# Patient Record
Sex: Female | Born: 1960
Health system: Southern US, Community
[De-identification: ages and names within clinical notes are randomized; demographics above are authoritative.]

## PROBLEM LIST (undated history)

## (undated) DIAGNOSIS — F32A Depression, unspecified: Secondary | ICD-10-CM

## (undated) DIAGNOSIS — F329 Major depressive disorder, single episode, unspecified: Secondary | ICD-10-CM

## (undated) DIAGNOSIS — R600 Localized edema: Secondary | ICD-10-CM

## (undated) DIAGNOSIS — G8929 Other chronic pain: Secondary | ICD-10-CM

## (undated) DIAGNOSIS — C439 Malignant melanoma of skin, unspecified: Secondary | ICD-10-CM

## (undated) DIAGNOSIS — M1990 Unspecified osteoarthritis, unspecified site: Secondary | ICD-10-CM

## (undated) DIAGNOSIS — D649 Anemia, unspecified: Secondary | ICD-10-CM

## (undated) DIAGNOSIS — R32 Unspecified urinary incontinence: Secondary | ICD-10-CM

## (undated) DIAGNOSIS — K5792 Diverticulitis of intestine, part unspecified, without perforation or abscess without bleeding: Secondary | ICD-10-CM

## (undated) DIAGNOSIS — M549 Dorsalgia, unspecified: Secondary | ICD-10-CM

## (undated) DIAGNOSIS — Z720 Tobacco use: Secondary | ICD-10-CM

## (undated) HISTORY — DX: Unspecified urinary incontinence: R32

## (undated) HISTORY — PX: BACK SURGERY: SHX140

## (undated) HISTORY — DX: Major depressive disorder, single episode, unspecified: F32.9

## (undated) HISTORY — DX: Depression, unspecified: F32.A

## (undated) HISTORY — PX: TONSILLECTOMY: SUR1361

## (undated) HISTORY — DX: Unspecified osteoarthritis, unspecified site: M19.90

## (undated) HISTORY — DX: Localized edema: R60.0

## (undated) HISTORY — DX: Anemia, unspecified: D64.9

## (undated) HISTORY — DX: Malignant melanoma of skin, unspecified: C43.9

## (undated) HISTORY — PX: MELANOMA EXCISION: SHX5266

## (undated) HISTORY — DX: Tobacco use: Z72.0

---

## 1987-09-21 HISTORY — PX: ABDOMINAL HYSTERECTOMY: SHX81

## 2015-05-20 ENCOUNTER — Encounter: Payer: Self-pay | Admitting: Medical

## 2015-05-20 ENCOUNTER — Telehealth: Payer: Self-pay | Admitting: Medical

## 2015-05-20 ENCOUNTER — Ambulatory Visit (INDEPENDENT_AMBULATORY_CARE_PROVIDER_SITE_OTHER): Payer: BLUE CROSS/BLUE SHIELD | Admitting: Medical

## 2015-05-20 VITALS — BP 145/79 | HR 108 | Temp 98.2°F | Ht 61.2 in | Wt 141.2 lb

## 2015-05-20 DIAGNOSIS — C439 Malignant melanoma of skin, unspecified: Secondary | ICD-10-CM | POA: Diagnosis not present

## 2015-05-20 DIAGNOSIS — M1990 Unspecified osteoarthritis, unspecified site: Secondary | ICD-10-CM | POA: Diagnosis not present

## 2015-05-20 DIAGNOSIS — F329 Major depressive disorder, single episode, unspecified: Secondary | ICD-10-CM

## 2015-05-20 DIAGNOSIS — D72829 Elevated white blood cell count, unspecified: Secondary | ICD-10-CM

## 2015-05-20 DIAGNOSIS — R197 Diarrhea, unspecified: Secondary | ICD-10-CM | POA: Diagnosis not present

## 2015-05-20 DIAGNOSIS — F32A Depression, unspecified: Secondary | ICD-10-CM | POA: Insufficient documentation

## 2015-05-20 DIAGNOSIS — E876 Hypokalemia: Secondary | ICD-10-CM

## 2015-05-20 LAB — CBC WITH DIFFERENTIAL/PLATELET
BASOS ABS: 0 10*3/uL (ref 0.0–0.1)
Basophils Relative: 0.3 % (ref 0.0–3.0)
EOS ABS: 0 10*3/uL (ref 0.0–0.7)
EOS PCT: 0.3 % (ref 0.0–5.0)
HCT: 37.4 % (ref 36.0–46.0)
Hemoglobin: 12.4 g/dL (ref 12.0–15.0)
LYMPHS ABS: 1.7 10*3/uL (ref 0.7–4.0)
LYMPHS PCT: 13 % (ref 12.0–46.0)
MCHC: 33 g/dL (ref 30.0–36.0)
MCV: 94.9 fl (ref 78.0–100.0)
MONO ABS: 1.2 10*3/uL — AB (ref 0.1–1.0)
MONOS PCT: 8.9 % (ref 3.0–12.0)
NEUTROS ABS: 10.2 10*3/uL — AB (ref 1.4–7.7)
NEUTROS PCT: 77.5 % — AB (ref 43.0–77.0)
PLATELETS: 728 10*3/uL — AB (ref 150.0–400.0)
RBC: 3.94 Mil/uL (ref 3.87–5.11)
RDW: 13.4 % (ref 11.5–15.5)
WBC: 13.1 10*3/uL — AB (ref 4.0–10.5)

## 2015-05-20 LAB — COMPREHENSIVE METABOLIC PANEL
ALT: 8 U/L (ref 0–35)
AST: 12 U/L (ref 0–37)
Albumin: 3.5 g/dL (ref 3.5–5.2)
Alkaline Phosphatase: 82 U/L (ref 39–117)
BILIRUBIN TOTAL: 0.4 mg/dL (ref 0.2–1.2)
BUN: 8 mg/dL (ref 6–23)
CO2: 30 meq/L (ref 19–32)
Calcium: 9.5 mg/dL (ref 8.4–10.5)
Chloride: 90 mEq/L — ABNORMAL LOW (ref 96–112)
Creatinine, Ser: 0.55 mg/dL (ref 0.40–1.20)
GFR: 121.42 mL/min (ref >60.00)
GLUCOSE: 97 mg/dL (ref 70–99)
Potassium: 3.2 mEq/L — ABNORMAL LOW (ref 3.5–5.1)
SODIUM: 129 meq/L — AB (ref 135–145)
TOTAL PROTEIN: 7.2 g/dL (ref 6.0–8.3)

## 2015-05-20 MED ORDER — CIPROFLOXACIN HCL 500 MG PO TABS: 500.0000 mg | ORAL_TABLET | Freq: Two times a day (BID) | ORAL | Status: DC

## 2015-05-20 MED ORDER — POTASSIUM CHLORIDE CRYS ER 10 MEQ PO TBCR: 10.0000 meq | EXTENDED_RELEASE_TABLET | Freq: Every day | ORAL | Status: DC

## 2015-05-20 MED ORDER — DIPHENOXYLATE-ATROPINE 2.5-0.025 MG PO TABS: ORAL_TABLET | ORAL | Status: DC

## 2015-05-20 MED ORDER — ONDANSETRON 8 MG PO TBDP: 8.0000 mg | ORAL_TABLET | Freq: Three times a day (TID) | ORAL | Status: DC | PRN

## 2015-05-20 NOTE — Assessment & Plan Note (Signed)
Hx of melanoma-  Excised from upper chest region. Pt sees dermatologist regularly.

## 2015-05-20 NOTE — Assessment & Plan Note (Signed)
Depression- This occurred when husband passed away. Now feels fine

## 2015-05-20 NOTE — Progress Notes (Signed)
Pre visit review using our clinic review tool, if applicable. No additional management support is needed unless otherwise documented below in the visit note. 

## 2015-05-20 NOTE — Progress Notes (Signed)
Subjective:    Patient ID: [name], female    DOB: [birth year], 54 y.o.   MRN: [id number]  HPI   I have reviewed pt PMH, PSH, FH, Social History and Surgical History  Pt is Government social research officer for [employer]. Pt does walks every day for 45 minutes at work on treadmill,  No caffeine since August 12th, Not healthy diet since 12th.    Pt states since August 12 she has had some diarrhea. Some intermittent nauseau and vomiting. Pt states symptoms worse until August 18 th. Then felt good for couple of days then reoccurred.   Pt in today reporting  diarrhea for  Almost 21 days. On review no  association with with any foods or meal that immediately preceded onset of diarrhea. Report contact with no  persons with GI illness. Recent no antibiotics. Reports no history of an inflammatory bowel diseases. Reports approximately number of stools about 8-12 loose stools  a day(past 2-3 days). Pt vomiting about 1-2 times a day. Stomach cramps. Pt has tried otc treatments. Before diarrhea came on she was not constipated.   Early on 3-4 times a day loose stools but has gradually gotten worse. Last 2-3 days 8-12 times a day. Pt states no one close to her has had Gi type illness. Pt has not visited anyone recently in hospital.  Arthritis history- knees, hand lt hip and lower back pain. Pt states she may need hip replacement. Pt is on cymbalta and tramadol for pain.  Hx of melanoma-  Excised from upper chest region. Pt sees dermatologist regularly.  Depression- This occurred when husband passed away. Now feels fine.   Pt is a smoker.     Review of Systems  Constitutional: Positive for chills and fatigue. Negative for fever and diaphoresis.  Respiratory: Negative for cough, chest tightness, shortness of breath and wheezing.   Cardiovascular: Negative for chest pain and palpitations.  Gastrointestinal: Positive for nausea, vomiting, abdominal pain and diarrhea. Negative for blood in stool, abdominal  distention and rectal pain.       Last time vomited this am.  Genitourinary:       Hx of urinary incontinence.  Musculoskeletal: Negative for back pain.  Neurological: Negative for dizziness and headaches.  Hematological: Negative for adenopathy. Does not bruise/bleed easily.  Psychiatric/Behavioral: Negative for behavioral problems and confusion.    Past Medical History  Diagnosis Date  . Arthritis   . Cancer   . Depression     Social History   Social History  . Marital Status: Widowed    Spouse Name: N/A  . Number of Children: N/A  . Years of Education: N/A   Occupational History  . Not on file.   Social History Main Topics  . Smoking status: Current Every Day Smoker  . Smokeless tobacco: Never Used  . Alcohol Use: 0.0 oz/week    0 Standard drinks or equivalent per week  . Drug Use: Not on file  . Sexual Activity: Not on file   Other Topics Concern  . Not on file   Social History Narrative  . No narrative on file    Past Surgical History  Procedure Laterality Date  . Abdominal hysterectomy    . Tonsillectomy    . Back surgery    . Melanoma excision      Family History  Problem Relation Age of Onset  . Hypertension Father     No Known Allergies  No current outpatient prescriptions on file prior to visit.  No current facility-administered medications on file prior to visit.    BP 145/79 mmHg  Pulse 110  Temp(Src) 98.2 F (36.8 C) (Oral)  Ht 5' 1.2" (1.554 m)  Wt 141 lb 3.2 oz (64.048 kg)  BMI 26.52 kg/m2  SpO2 97%       Objective:   Physical Exam  General Appearance- Not in acute distress.  HEENT Eyes- Scleraeral/Conjuntiva-bilat- Not Yellow. Mouth & Throat- Normal.  Chest and Lung Exam Auscultation: Breath sounds:-Normal. Adventitious sounds:- No Adventitious sounds.  Cardiovascular Auscultation:Rythm - Regular. Heart Sounds -Normal heart sounds.  Abdomen Inspection:-Inspection Normal.  Palpation/Perucssion: Palpation and  Percussion of the abdomen reveal- Non Tender, No Rebound tenderness, No rigidity(Guarding) and No Palpable abdominal masses.  Liver:-Normal.  Spleen:- Normal.   Skin- moist skin. No tenting.  Back- no cva tenderness      Assessment & Plan:    Arthritis Arthritis history- knees, hand lt hip and lower back pain. Pt states she may need hip replacement. Pt is on cymbalta and tramadol for pain.  Melanoma of skin Hx of melanoma-  Excised from upper chest region. Pt sees dermatologist regularly.  Depression Depression- This occurred when husband passed away. Now feels fine    You may have bacterial cause of diarrhea based on severity and duration. I want you to rest, hydrate, follow bland diet guidlines and take tylenol for fever.  Lomotil  for diarrhea. For nausea of vomiting, I am prescribing zofran.   There is some chance that your have a bacterial infection so I do want you to get stool panel kit and turn that in as soon as possible. Turning stool panel kit earlier will provide [place] with quicker result of studies and more informed decision if antibiotics are needed.  Start cipro after stool panel kit turned in.

## 2015-05-20 NOTE — Patient Instructions (Addendum)
Arthritis Arthritis history- knees, hand lt hip and lower back pain. Pt states she may need hip replacement. Pt is on cymbalta and tramadol for pain.  Melanoma of skin Hx of melanoma-  Excised from upper chest region. Pt sees dermatologist regularly.  Depression Depression- This occurred when husband passed away. Now feels fine     You may have bacterial cause of diarrhea based on severity and duration. I want you to rest, hydrate, follow bland diet guidlines and take tylenol for fever.  Lomotil  for diarrhea. For nausea of vomiting, I am prescribing zofran.   There is some chance that your have a bacterial infection so I do want you to get stool panel kit and turn that in as soon as possible. Turning stool panel kit earlier will provide [place] with quicker result of studies and more informed decision if antibiotics are needed.  Start cipro after stool panel kit turned in.  Follow up 5 days or as needed.

## 2015-05-20 NOTE — Telephone Encounter (Signed)
Labs placed and k-dur ordered

## 2015-05-20 NOTE — Assessment & Plan Note (Signed)
Arthritis history- knees, hand lt hip and lower back pain. Pt states she may need hip replacement. Pt is on cymbalta and tramadol for pain.

## 2015-05-23 ENCOUNTER — Other Ambulatory Visit: Payer: BLUE CROSS/BLUE SHIELD

## 2015-05-23 ENCOUNTER — Other Ambulatory Visit (INDEPENDENT_AMBULATORY_CARE_PROVIDER_SITE_OTHER): Payer: BLUE CROSS/BLUE SHIELD

## 2015-05-23 ENCOUNTER — Telehealth: Payer: Self-pay | Admitting: Medical

## 2015-05-23 DIAGNOSIS — D72829 Elevated white blood cell count, unspecified: Secondary | ICD-10-CM

## 2015-05-23 DIAGNOSIS — E876 Hypokalemia: Secondary | ICD-10-CM | POA: Diagnosis not present

## 2015-05-23 LAB — CBC WITH DIFFERENTIAL/PLATELET
BASOS ABS: 0 10*3/uL (ref 0.0–0.1)
Basophils Relative: 0.2 % (ref 0.0–3.0)
EOS ABS: 0 10*3/uL (ref 0.0–0.7)
Eosinophils Relative: 0.3 % (ref 0.0–5.0)
HEMATOCRIT: 36.9 % (ref 36.0–46.0)
Hemoglobin: 12.3 g/dL (ref 12.0–15.0)
LYMPHS PCT: 9.7 % — AB (ref 12.0–46.0)
Lymphs Abs: 1.6 10*3/uL (ref 0.7–4.0)
MCHC: 33.3 g/dL (ref 30.0–36.0)
MCV: 94.6 fl (ref 78.0–100.0)
Monocytes Absolute: 1.6 10*3/uL — ABNORMAL HIGH (ref 0.1–1.0)
Monocytes Relative: 10.2 % (ref 3.0–12.0)
NEUTROS ABS: 12.7 10*3/uL — AB (ref 1.4–7.7)
Neutrophils Relative %: 79.6 % — ABNORMAL HIGH (ref 43.0–77.0)
PLATELETS: 745 10*3/uL — AB (ref 150.0–400.0)
RBC: 3.9 Mil/uL (ref 3.87–5.11)
RDW: 13.4 % (ref 11.5–15.5)
WBC: 15.9 10*3/uL — AB (ref 4.0–10.5)

## 2015-05-23 LAB — COMPREHENSIVE METABOLIC PANEL
ALT: 10 U/L (ref 0–35)
AST: 11 U/L (ref 0–37)
Albumin: 3.3 g/dL — ABNORMAL LOW (ref 3.5–5.2)
Alkaline Phosphatase: 81 U/L (ref 39–117)
BILIRUBIN TOTAL: 0.5 mg/dL (ref 0.2–1.2)
BUN: 4 mg/dL — ABNORMAL LOW (ref 6–23)
CALCIUM: 9.7 mg/dL (ref 8.4–10.5)
CO2: 31 meq/L (ref 19–32)
CREATININE: 0.55 mg/dL (ref 0.40–1.20)
Chloride: 91 mEq/L — ABNORMAL LOW (ref 96–112)
GFR: 121.42 mL/min (ref >60.00)
GLUCOSE: 108 mg/dL — AB (ref 70–99)
Potassium: 3.3 mEq/L — ABNORMAL LOW (ref 3.5–5.1)
Sodium: 131 mEq/L — ABNORMAL LOW (ref 135–145)
TOTAL PROTEIN: 7.1 g/dL (ref 6.0–8.3)

## 2015-05-23 LAB — OVA AND PARASITE EXAMINATION: OP: NONE SEEN

## 2015-05-23 LAB — CLOSTRIDIUM DIFFICILE BY PCR: CDIFFPCR: NOT DETECTED

## 2015-05-23 MED ORDER — CIPROFLOXACIN HCL 500 MG PO TABS: 500.0000 mg | ORAL_TABLET | Freq: Two times a day (BID) | ORAL | Status: DC

## 2015-05-23 MED ORDER — METRONIDAZOLE 500 MG PO TABS: 500.0000 mg | ORAL_TABLET | Freq: Three times a day (TID) | ORAL | Status: DC

## 2015-05-23 NOTE — Telephone Encounter (Signed)
I called pt.  Her diarrhea is about the same. She did not start lomotil yet. Will start today. She started cipro yesterday. Her white count more elevated than the other day. Since culture is pending I decided would rx flagyl in addition to the cipro. In light of upcoming long weekend. Also get k-dur tabs I rx'd. Hydrate well. Follow up on wed with me. Can get cbc on Tuesday if diarrhea is continuing. If some better just follow up with me on Wednesday.  If symptoms worsening as described then explained ED evaluation over the weekend.

## 2015-05-26 LAB — STOOL CULTURE

## 2015-05-28 ENCOUNTER — Encounter: Payer: Self-pay | Admitting: Medical

## 2015-05-28 ENCOUNTER — Other Ambulatory Visit: Payer: BLUE CROSS/BLUE SHIELD

## 2015-05-28 ENCOUNTER — Telehealth: Payer: Self-pay | Admitting: Medical

## 2015-05-28 ENCOUNTER — Encounter: Payer: Self-pay | Admitting: Physician Assistant

## 2015-05-28 ENCOUNTER — Ambulatory Visit (HOSPITAL_BASED_OUTPATIENT_CLINIC_OR_DEPARTMENT_OTHER)
Admission: RE | Admit: 2015-05-28 | Discharge: 2015-05-28 | Disposition: A | Discharge status: Home / Self Care | Payer: BLUE CROSS/BLUE SHIELD | Source: Ambulatory Visit | Attending: Medical | Admitting: Medical

## 2015-05-28 ENCOUNTER — Ambulatory Visit (INDEPENDENT_AMBULATORY_CARE_PROVIDER_SITE_OTHER): Payer: BLUE CROSS/BLUE SHIELD | Admitting: Medical

## 2015-05-28 VITALS — BP 120/76 | HR 100 | Temp 97.3°F | Resp 16 | Ht 61.2 in | Wt 136.0 lb

## 2015-05-28 DIAGNOSIS — R140 Abdominal distension (gaseous): Secondary | ICD-10-CM

## 2015-05-28 DIAGNOSIS — D611 Drug-induced aplastic anemia: Secondary | ICD-10-CM

## 2015-05-28 DIAGNOSIS — R197 Diarrhea, unspecified: Secondary | ICD-10-CM

## 2015-05-28 DIAGNOSIS — R1084 Generalized abdominal pain: Secondary | ICD-10-CM

## 2015-05-28 LAB — CBC WITH DIFFERENTIAL/PLATELET
BASOS ABS: 0 10*3/uL (ref 0.0–0.1)
Basophils Relative: 0 % (ref 0.0–3.0)
Eosinophils Absolute: 0 10*3/uL (ref 0.0–0.7)
Eosinophils Relative: 0.2 % (ref 0.0–5.0)
HCT: 38 % (ref 36.0–46.0)
Hemoglobin: 12.5 g/dL (ref 12.0–15.0)
LYMPHS ABS: 1.3 10*3/uL (ref 0.7–4.0)
Lymphocytes Relative: 7 % — ABNORMAL LOW (ref 12.0–46.0)
MCHC: 32.8 g/dL (ref 30.0–36.0)
MCV: 95 fl (ref 78.0–100.0)
MONO ABS: 1.5 10*3/uL — AB (ref 0.1–1.0)
MONOS PCT: 8.1 % (ref 3.0–12.0)
Neutro Abs: 15.5 10*3/uL — ABNORMAL HIGH (ref 1.4–7.7)
Platelets: 954 10*3/uL — ABNORMAL HIGH (ref 150.0–400.0)
RBC: 4 Mil/uL (ref 3.87–5.11)
RDW: 13.8 % (ref 11.5–15.5)
WBC: 18.3 10*3/uL (ref 4.0–10.5)

## 2015-05-28 LAB — COMPREHENSIVE METABOLIC PANEL
ALK PHOS: 64 U/L (ref 39–117)
ALT: 7 U/L (ref 0–35)
AST: 10 U/L (ref 0–37)
Albumin: 3.1 g/dL — ABNORMAL LOW (ref 3.5–5.2)
BILIRUBIN TOTAL: 0.4 mg/dL (ref 0.2–1.2)
BUN: 7 mg/dL (ref 6–23)
CO2: 28 mEq/L (ref 19–32)
CREATININE: 0.48 mg/dL (ref 0.40–1.20)
Calcium: 9.6 mg/dL (ref 8.4–10.5)
Chloride: 90 mEq/L — ABNORMAL LOW (ref 96–112)
GFR: 142.06 mL/min (ref >60.00)
GLUCOSE: 109 mg/dL — AB (ref 70–99)
Potassium: 4.3 mEq/L (ref 3.5–5.1)
SODIUM: 128 meq/L — AB (ref 135–145)
TOTAL PROTEIN: 6.8 g/dL (ref 6.0–8.3)

## 2015-05-28 MED ORDER — METRONIDAZOLE 500 MG PO TABS: 500.0000 mg | ORAL_TABLET | Freq: Three times a day (TID) | ORAL | Status: DC

## 2015-05-28 NOTE — Patient Instructions (Addendum)
Diarrhea For one month. 3 wks into presentation when first evaluated. By history considered infectious/bacterial, c dif, or maybe diverticulitis.   Stool panel studies done and negative. Some improvement with antibiotic. Wbc has been mild- moderate elevated.  Loose stools still persist. Will try to get in with GI as soon as possible.  CBC and cmp today.  Continue propel hydration and bland food. Can continue immodium.    Follow up as needed post GI eval. Appointment GI 06-13-2015. But may try other office if not improving.  Will go ahead and get abd xray since some report of leaking type stools.   Call me with update on Friday how you are. May get ct abd if worsening.

## 2015-05-28 NOTE — Progress Notes (Signed)
Subjective:    Patient ID: [name], female    DOB: August 26, 1961, 54 y.o.   MRN: [id number]  HPI   Pt in states she still not feeling well. She states feels tired and sleeping a lot.   Pt states diarrhea stopped on Saturday. She states still having some mild watery loose stools/ like leaking. Pt stools are still not formed. Pt stools studies were all negative.   Pt diarrhea has been going on for about a month over all(some decrease since Saturday). Pt has been on cipro and flagyl. I was thinking based on her presentation that one of studies would have been positive. However culture, stool o and p, and c dif test was negative.   Pt does mention she is over due for colonscopy by one year. Occasional bilateral lower quadrant pain. Mild and transient.     Review of Systems  Constitutional: Positive for fatigue. Negative for fever and chills.  Respiratory: Negative for cough, chest tightness, shortness of breath and wheezing.   Cardiovascular: Negative for chest pain and palpitations.  Gastrointestinal: Positive for diarrhea. Negative for nausea, vomiting, abdominal pain, constipation, blood in stool, abdominal distention, anal bleeding and rectal pain.  Musculoskeletal: Negative for back pain.  Neurological: Negative for dizziness and headaches.  Hematological: Negative for adenopathy. Does not bruise/bleed easily.  Psychiatric/Behavioral: Negative for behavioral problems and confusion.    Past Medical History  Diagnosis Date  . Arthritis   . Cancer   . Depression     pt states when her husband passed away. now feels fine.    Social History   Social History  . Marital Status: Widowed    Spouse Name: N/A  . Number of Children: N/A  . Years of Education: N/A   Occupational History  . Not on file.   Social History Main Topics  . Smoking status: Current Every Day Smoker -- 1.00 packs/day for 30 years  . Smokeless tobacco: Never Used  . Alcohol Use: 0.0 oz/week    0  Standard drinks or equivalent per week     Comment: 2 glasses of wine a week.  . Drug Use: Not on file  . Sexual Activity: No   Other Topics Concern  . Not on file   Social History Narrative    Past Surgical History  Procedure Laterality Date  . Abdominal hysterectomy    . Tonsillectomy    . Back surgery    . Melanoma excision      Family History  Problem Relation Age of Onset  . Hypertension Father     No Known Allergies  Current Outpatient Prescriptions on File Prior to Visit  Medication Sig Dispense Refill  . ciprofloxacin (CIPRO) 500 MG tablet Take 1 tablet (500 mg total) by mouth 2 (two) times daily. 10 tablet 0  . meloxicam (MOBIC) 15 MG tablet Take 15 mg by mouth daily.     No current facility-administered medications on file prior to visit.    BP 120/76 mmHg  Pulse 100  Temp(Src) 97.3 F (36.3 C) (Oral)  Resp 16  Ht 5' 1.2" (1.554 m)  Wt 136 lb (61.689 kg)  BMI 25.54 kg/m2  SpO2 98%       Objective:   Physical Exam  General Appearance- Not in acute distress.  HEENT Eyes- Scleraeral/Conjuntiva-bilat- Not Yellow. Mouth & Throat- Normal.  Chest and Lung Exam Auscultation: Breath sounds:-Normal. Adventitious sounds:- No Adventitious sounds.  Cardiovascular Auscultation:Rythm - Regular. Heart Sounds -Normal heart sounds.  Abdomen Inspection:-Inspection  Normal.  Palpation/Perucssion: Palpation and Percussion of the abdomen reveal- Non Tender, No Rebound tenderness, No rigidity(Guarding) and No Palpable abdominal masses.  Liver:-Normal.  Spleen:- Normal.   .      Assessment & Plan:  Diarrhea For one month. 3 wks into presentation when first evaluated. By history considered infectious/bacterial, c dif, or maybe diverticulitis.   Stool panel studies done and negative. Some improvement with antibiotic. Wbc has been mild- moderate elevated.  Loose stools still persist. Will try to get in with GI as soon as possible.  CBC and cmp today.   Continue propel hydration and bland food. Can continue immodium.  Follow up as needed post GI eval. Appointment GI 06-13-2015. But may try other office if not improving.  Will go ahead and get abd xray since some report of leaking type stools.   Call me with update on Friday how you are. May get ct abd if worsening.

## 2015-05-28 NOTE — Telephone Encounter (Signed)
I talked with pt today. Notified of her lab results and increase of wbc. We talked about her presentation of diarrhea for one months. And I inquired about early on if she had any llq pain. She describes that early on she remembers pain in that area. I advised pt that I will again try to get GI to see her. See if someone can see tomorrow. If not I am likely going to advise her to go to ED at [facility]. Since she likely needs imaging and may need iv antibioitics in light of the increasing wbc counts.  I advised pt during interim if her symptoms worsen or change then ED evaluation tonight. She agreed.

## 2015-05-28 NOTE — Progress Notes (Signed)
Pre visit review using our clinic review tool, if applicable. No additional management support is needed unless otherwise documented below in the visit note. 

## 2015-05-28 NOTE — Assessment & Plan Note (Addendum)
For one month. 3 wks into presentation when first evaluated. By history considered infectious/bacterial, c dif, or maybe diverticulitis.   Stool panel studies done and negative. Some improvement with antibiotic. Wbc has been mild- moderate elevated.  Loose stools still persist. Will try to get in with GI as soon as possible.  CBC and cmp today.  Continue propel hydration and bland food. Can continue immodium.

## 2015-05-29 ENCOUNTER — Telehealth: Payer: Self-pay | Admitting: Medical

## 2015-05-29 LAB — PATHOLOGIST SMEAR REVIEW

## 2015-05-29 NOTE — Telephone Encounter (Signed)
I called pt today. She feels about the same. Today faint intermittent rlq pain but not constant. We did get her scheduled to see GI tomorrow 05-29-2015 at 2:15. Gave her address, phone number and name of provider that will see her. I explained to her taht  provided she is not getting worse then  keep that appointment. In event before then any severe pain or worsening symptoms then ED evaluation as explained yesterday. Pt expressed understanding.

## 2015-05-30 ENCOUNTER — Ambulatory Visit (INDEPENDENT_AMBULATORY_CARE_PROVIDER_SITE_OTHER): Payer: BLUE CROSS/BLUE SHIELD | Admitting: Physician Assistant

## 2015-05-30 ENCOUNTER — Other Ambulatory Visit (INDEPENDENT_AMBULATORY_CARE_PROVIDER_SITE_OTHER): Payer: BLUE CROSS/BLUE SHIELD

## 2015-05-30 ENCOUNTER — Encounter: Payer: Self-pay | Admitting: Physician Assistant

## 2015-05-30 VITALS — BP 114/74 | HR 96 | Ht 61.25 in | Wt 133.5 lb

## 2015-05-30 DIAGNOSIS — R634 Abnormal weight loss: Secondary | ICD-10-CM

## 2015-05-30 DIAGNOSIS — R109 Unspecified abdominal pain: Secondary | ICD-10-CM

## 2015-05-30 DIAGNOSIS — Z86010 Personal history of colonic polyps: Secondary | ICD-10-CM | POA: Diagnosis not present

## 2015-05-30 DIAGNOSIS — R197 Diarrhea, unspecified: Secondary | ICD-10-CM

## 2015-05-30 DIAGNOSIS — D729 Disorder of white blood cells, unspecified: Secondary | ICD-10-CM

## 2015-05-30 DIAGNOSIS — Z800 Family history of malignant neoplasm of digestive organs: Secondary | ICD-10-CM

## 2015-05-30 DIAGNOSIS — D473 Essential (hemorrhagic) thrombocythemia: Secondary | ICD-10-CM

## 2015-05-30 DIAGNOSIS — D75839 Thrombocytosis, unspecified: Secondary | ICD-10-CM

## 2015-05-30 LAB — COMPREHENSIVE METABOLIC PANEL
ALBUMIN: 3.3 g/dL — AB (ref 3.5–5.2)
ALK PHOS: 63 U/L (ref 39–117)
ALT: 6 U/L (ref 0–35)
AST: 9 U/L (ref 0–37)
BUN: 6 mg/dL (ref 6–23)
CALCIUM: 9.9 mg/dL (ref 8.4–10.5)
CO2: 29 mEq/L (ref 19–32)
CREATININE: 0.41 mg/dL (ref 0.40–1.20)
Chloride: 88 mEq/L — ABNORMAL LOW (ref 96–112)
GFR: 170.4 mL/min (ref >60.00)
Glucose, Bld: 101 mg/dL — ABNORMAL HIGH (ref 70–99)
POTASSIUM: 3.9 meq/L (ref 3.5–5.1)
SODIUM: 128 meq/L — AB (ref 135–145)
TOTAL PROTEIN: 7.3 g/dL (ref 6.0–8.3)
Total Bilirubin: 0.4 mg/dL (ref 0.2–1.2)

## 2015-05-30 LAB — CBC WITH DIFFERENTIAL/PLATELET
BASOS PCT: 0.6 % (ref 0.0–3.0)
Basophils Absolute: 0.1 10*3/uL (ref 0.0–0.1)
EOS PCT: 0.1 % (ref 0.0–5.0)
Eosinophils Absolute: 0 10*3/uL (ref 0.0–0.7)
HEMATOCRIT: 38.1 % (ref 36.0–46.0)
HEMOGLOBIN: 12.7 g/dL (ref 12.0–15.0)
LYMPHS PCT: 8.7 % — AB (ref 12.0–46.0)
Lymphs Abs: 1.9 10*3/uL (ref 0.7–4.0)
MCHC: 33.4 g/dL (ref 30.0–36.0)
MCV: 93 fl (ref 78.0–100.0)
MONO ABS: 1.2 10*3/uL — AB (ref 0.1–1.0)
MONOS PCT: 5.6 % (ref 3.0–12.0)
Neutro Abs: 18.1 10*3/uL — ABNORMAL HIGH (ref 1.4–7.7)
Neutrophils Relative %: 85 % — ABNORMAL HIGH (ref 43.0–77.0)
Platelets: 949 10*3/uL — ABNORMAL HIGH (ref 150.0–400.0)
RBC: 4.09 Mil/uL (ref 3.87–5.11)
RDW: 13.9 % (ref 11.5–15.5)

## 2015-05-30 LAB — IGA: IGA: 260 mg/dL (ref 68–378)

## 2015-05-30 LAB — TSH: TSH: 2.9 u[IU]/mL (ref 0.35–4.50)

## 2015-05-30 MED ORDER — NA SULFATE-K SULFATE-MG SULF 17.5-3.13-1.6 GM/177ML PO SOLN
ORAL | Status: DC
Start: 2015-05-30 — End: 2015-06-09

## 2015-05-30 NOTE — Patient Instructions (Addendum)
Your physician has requested that you go to the basement for lab work before leaving today  Please purchase the following medications over the counter and take as directed: Imodium as needed  You have been scheduled for a colonoscopy. Please follow written instructions given to you at your visit today.  Please pick up your prep supplies at the pharmacy within the next 1-3 days. If you use inhalers (even only as needed), please bring them with you on the day of your procedure. Your physician has requested that you go to www.startemmi.com and enter the access code given to you at your visit today. This web site gives a general overview about your procedure. However, you should still follow specific instructions given to you by our office regarding your preparation for the procedure.  You have been scheduled for a CT scan of the abdomen and pelvis at [facility] ([address]---this is in the same building as Press [facility]).   You are scheduled on 06-03-15 at 2:30pm  You should arrive 15 minutes prior to your appointment time for registration. Please follow the written instructions below on the day of your exam:  WARNING: IF YOU ARE ALLERGIC TO IODINE/X-RAY DYE, PLEASE NOTIFY RADIOLOGY IMMEDIATELY AT [phone]! YOU WILL BE GIVEN A 13 HOUR PREMEDICATION PREP.  1) Do not eat or drink anything after  (4 hours prior to your test) 2) You have been given 2 bottles of oral contrast to drink. The solution may taste               better if refrigerated, but do NOT add ice or any other liquid to this solution. Shake             well before drinking.    Drink 1 bottle of contrast @ 12:30pm (2 hours prior to your exam)  Drink 1 bottle of contrast @ 1:30pm (1 hour prior to your exam)  You may take any medications as prescribed with a small amount of water except for the following: Metformin, Glucophage, Glucovance, Avandamet, Riomet, Fortamet, Actoplus Met, Janumet, Glumetza or  Metaglip. The above medications must be held the day of the exam AND 48 hours after the exam.  The purpose of you drinking the oral contrast is to aid in the visualization of your intestinal tract. The contrast solution may cause some diarrhea. Before your exam is started, you will be given a small amount of fluid to drink. Depending on your individual set of symptoms, you may also receive an intravenous injection of x-ray contrast/dye. Plan on being at [facility] for 30 minutes or long, depending on the type of exam you are having performed.  This test typically takes 30-45 minutes to complete.  If you have any questions regarding your exam or if you need to reschedule, you may call the CT department at [phone] between the hours of 8:00 am and 5:00 pm, Monday-Friday.  ________________________________________________________________________

## 2015-05-31 ENCOUNTER — Encounter: Payer: Self-pay | Admitting: Physician Assistant

## 2015-05-31 NOTE — Progress Notes (Signed)
Agree with initial assessment and plans 

## 2015-05-31 NOTE — Progress Notes (Signed)
Patient ID: [name], female   DOB: 09-30-1960, 54 y.o.   MRN: [id number]    HPI:  [name] is a 54 y.o.   female  referred by Mackie Pai, PA-C for evaluation of diarrhea. [name] was initially evaluated at her current PCPs office to establish care as a new patient on August 30. Prior to that she had not seen a primary care provider on a regular basis. She reports that she has a history of depression after the loss of her husband that she has been recently been feeling better. She has a history of arthritis and has had a skin lesion removed from her chest in the past which was a melanoma. She also reports that she has had a tonsillectomy, back surgery, and an abdominal hysterectomy in the late 1980s. She states her ovaries were not removed. She does not follow with GYN regularly.  She presents today with a complaint of diarrhea that has been present since August 14. She states her diarrhea came on suddenly and has been associated with episodes of fecal incontinence. She reports that she went on a cruise in mid to late July and swam [place]. She did not begin to feel ill until several weeks after returning home. At the onset of her diarrhea she had some bloody streaking in the stools and some nausea. The nausea subsided after several days but she continued to have diarrhea. She was evaluated at her primary care provider's office and given a trial of Cipro and Flagyl which she says provided no relief. She continues to have 6-10 bowel movements daily. They are watery to mushy in consistency and have some mucus with them she does have nocturnal stooling. She has a long history of urinary stress incontinence but has never had fecal incontinence until this episode. She reports that her stools are very oily and foul-smelling. She has lost approximately 12 pounds since the onset of her diarrhea. Her appetite has been diminished and she has an achy pain throughout the  lower abdomen that is not alleviated or exacerbated with ingestion of food or defecation. She had tried using tramadol for her discomfort with little relief. She has been using a heating pad with little relief. She has been using mobic for low back pain for a few weeks, but she states her diarrhea started before she started thoroughly moped. She has been on Cymbalta for several years. She denies fever, chills, or night sweats. She had blood work at her primary care provider's office that showed an elevated white count and elevated platelet count and has been advised to repeat those next week. She reports she has never had hematologic problems in the past. She had a colonoscopy at age 16 in [place]. She states she had a polyp removed and was advised to have repeat in 5 years, but she states she has been busy and did not get a chance to do this last year. She reports that her mother is alive at 39 in good health and her father died at 24 from head trauma after a fall. She has 4 maternal uncles who had colon cancer and 3 maternal aunts who had breast cancer as well as a maternal uncle with lymphoma. She has a 35 year old brother and she is not aware if his health status. She has a 58 year old sister with heart problems, and a 24 year old sister with melanoma and basal cell carcinoma. She had had dysphagia several years ago that was  worked up in [place]. She states she had a swallowing study and was instructed to drink with a straw. She states that since then she has had no further dysphagia   Past Medical History  Diagnosis Date  . Arthritis   . Depression     pt states when her husband passed away. now feels fine.  . Melanoma     Chest     Past Surgical History  Procedure Laterality Date  . Abdominal hysterectomy    . Tonsillectomy    . Back surgery    . Melanoma excision     Family History  Problem Relation Age of Onset  . Hypertension Father    Social History  Substance Use Topics  .  Smoking status: Current Every Day Smoker -- 1.00 packs/day for 30 years  . Smokeless tobacco: Never Used     Comment: Pt info given 05-30-2015  . Alcohol Use: 0.0 oz/week    0 Standard drinks or equivalent per week     Comment: 2 glasses of wine a week.   Current Outpatient Prescriptions  Medication Sig Dispense Refill  . DULoxetine (CYMBALTA) 60 MG capsule Take 60 mg by mouth daily.    . meloxicam (MOBIC) 15 MG tablet Take 15 mg by mouth daily.    . Na Sulfate-K Sulfate-Mg Sulf SOLN Please take as directed for colonoscopy 354 mL 0   No current facility-administered medications for this visit.   No Known Allergies   Review of Systems: Gen : Admits to fatigue, malaise, and weight loss CV: Denies chest pain, angina, palpitations, syncope, orthopnea, PND, peripheral edema, and claudication. Resp: Denies dyspnea at rest, dyspnea with exercise, cough, sputum, wheezing, coughing up blood, and pleurisy. GI: Denies vomiting blood, jaundice.  Denies dysphagia or odynophagia. Admits to abdominal pain, diarrhea, and fecal incontinence GU : Denies urinary burning, blood in urine, urinary frequency, urinary hesitancy, nocturnal urination, and urinary incontinence. MS: Denies joint pain, limitation of movement, and swelling, stiffness, low back pain, extremity pain. Denies muscle weakness, cramps, atrophy.  Derm: Denies rash, itching, dry skin, hives, moles, warts, or unhealing ulcers.  Psych: Denies depression, anxiety, memory loss, suicidal ideation, hallucinations, paranoia, and confusion. Heme: Denies bruising, bleeding, and enlarged lymph nodes. Neuro:  Denies any headaches, dizziness, paresthesias. Endo:  Denies any problems with DM, thyroid, adrenal function  Studies: Dg Abd 1 View  05/28/2015   CLINICAL DATA:  One month history of diarrhea, leaking of stool this past weekend  EXAM: ABDOMEN - 1 VIEW  COMPARISON:  None in PACs  FINDINGS: There is a moderate amount of gas throughout the colon.  There is a moderate stool burden as well. There is no evidence of a fecal impaction however. No evidence of small-bowel obstruction is a apparent. No free extraluminal gas collections are demonstrated. There are degenerative changes of the lower lumbar discs. There is degenerative change of the left hip.  IMPRESSION: The bowel gas pattern is not clearly abnormal. There may be mildly increased colonic stool burden but there is no evidence of fecal impaction.   Electronically Signed   By: David  [place] M.D.   On: 05/28/2015 10:43    LAB RESULTS:  Stool for C. difficile on 05/22/2015 was negative Stool for ova and parasites on 05/22/2015 was negative CBC 05/20/2015 white count 13.1, hemoglobin 12.1, hematocrit 37.9, platelets 728,000 CBC 05/23/2015 white count 15.9, hemoglobin 12.3, hematocrit 36.9, platelets 745,000 Blood work 05/28/2015 white count 18.3, hemoglobin 12.5, hematocrit 38, platelets 954,000, differential neutrophils 84.7,  lymphocytes 7. Pathologist snare review states leukocytosis due to absolute granulocytosis. Myeloid population consists predominantly of mature segmented neutrophils with mild left shift and reactive changes. No blasts seen. RBC are unremarkable. Marked thrombocytosis, if it persists, suggest hematologic evaluation if clinically indicated.  Prior Endoscopies:   See history of present illness  Physical Exam: BP 114/74 mmHg  Pulse 96  Ht 5' 1.25" (1.556 m)  Wt 133 lb 8 oz (60.555 kg)  BMI 25.01 kg/m2 Constitutional: Pleasant,well-developed, female in no acute distress. HEENT: Normocephalic and atraumatic. Conjunctivae are normal. No scleral icterus. Neck supple. No JVD Cardiovascular: Normal rate, regular rhythm.  Pulmonary/chest: Effort normal and breath sounds normal. No wheezing, rales or rhonchi. Abdominal: Soft, nondistended, mild diffuse lower abdominal tenderness with no rebound or guarding, Bowel sounds active throughout. There is a palpable fullness or  mass from the suprapubic area to the umbilicus and to 4 fingerbreadths to the left of the umbilicus. No hepatomegaly. Rectal: Loose brown stool, heme occult negative, anal wink present. Slightly diminished sphincter tone Extremities: no edema Lymphadenopathy: No cervical adenopathy noted. Neurological: Alert and oriented to person place and time. Skin: Skin is warm and dry. No rashes noted. Psychiatric: Normal mood and affect. Behavior is normal.  ASSESSMENT AND PLAN: 54 year old female with a three-week history of diarrhea associated with weight loss, referred for evaluation. Patient has been noted to have a leukocytosis as well as a thrombocytosis.(? Reactive  Vs infection vs malignancy?) A repeat CBC will be obtained today and if these continue to be noted, she would likely benefit from hematology evaluation. She will be scheduled for an abdominal pelvic CT to evaluate for colitis, mass, etc. in light of her diarrhea, abdominal pain, leukocytosis, weight loss, personal history of colon polyp, and strong family history of colon cancer. A TSH, IgA, and TTG will be obtained along with a repeat stool culture, stool for ova and parasites, stool for C. difficile, and stool for giardia. Patient will use Imodium as needed. She has signed a medical release form to obtain a copy of her colonoscopy report and the associated pathology report from [facility]. She has been instructed to go to the ER if she feels worse over the weekend. Further recommendations will be made pending the findings of her blood work, stool samples, and CT.    Malorie Bigford, Deloris Ping 05/31/2015, 8:28 AM  CC: Mackie Pai, PA-C

## 2015-06-02 ENCOUNTER — Other Ambulatory Visit: Payer: Self-pay | Admitting: *Deleted

## 2015-06-02 ENCOUNTER — Other Ambulatory Visit: Payer: BLUE CROSS/BLUE SHIELD

## 2015-06-02 DIAGNOSIS — D75839 Thrombocytosis, unspecified: Secondary | ICD-10-CM

## 2015-06-02 DIAGNOSIS — D72829 Elevated white blood cell count, unspecified: Secondary | ICD-10-CM

## 2015-06-02 DIAGNOSIS — D473 Essential (hemorrhagic) thrombocythemia: Secondary | ICD-10-CM

## 2015-06-02 LAB — TISSUE TRANSGLUTAMINASE, IGA: TISSUE TRANSGLUTAMINASE AB, IGA: 1 U/mL (ref <4)

## 2015-06-03 ENCOUNTER — Telehealth: Payer: Self-pay | Admitting: Oncology

## 2015-06-03 ENCOUNTER — Ambulatory Visit (INDEPENDENT_AMBULATORY_CARE_PROVIDER_SITE_OTHER)
Admission: RE | Admit: 2015-06-03 | Discharge: 2015-06-03 | Disposition: A | Discharge status: Home / Self Care | Payer: BLUE CROSS/BLUE SHIELD | Source: Ambulatory Visit | Attending: Physician Assistant | Admitting: Physician Assistant

## 2015-06-03 ENCOUNTER — Encounter (HOSPITAL_COMMUNITY): Payer: Self-pay | Admitting: Emergency Medicine

## 2015-06-03 ENCOUNTER — Inpatient Hospital Stay (HOSPITAL_COMMUNITY)
Admission: EM | Admit: 2015-06-03 | Discharge: 2015-06-09 | DRG: 330 | Disposition: A | Discharge status: Home Health | Payer: BLUE CROSS/BLUE SHIELD | Attending: General Surgery | Admitting: General Surgery

## 2015-06-03 ENCOUNTER — Telehealth: Payer: Self-pay | Admitting: Physician Assistant

## 2015-06-03 DIAGNOSIS — Z85820 Personal history of malignant melanoma of skin: Secondary | ICD-10-CM | POA: Diagnosis not present

## 2015-06-03 DIAGNOSIS — E876 Hypokalemia: Secondary | ICD-10-CM | POA: Diagnosis not present

## 2015-06-03 DIAGNOSIS — F17210 Nicotine dependence, cigarettes, uncomplicated: Secondary | ICD-10-CM | POA: Diagnosis present

## 2015-06-03 DIAGNOSIS — N731 Chronic parametritis and pelvic cellulitis: Secondary | ICD-10-CM | POA: Insufficient documentation

## 2015-06-03 DIAGNOSIS — M1990 Unspecified osteoarthritis, unspecified site: Secondary | ICD-10-CM | POA: Diagnosis present

## 2015-06-03 DIAGNOSIS — Z79899 Other long term (current) drug therapy: Secondary | ICD-10-CM | POA: Diagnosis not present

## 2015-06-03 DIAGNOSIS — N321 Vesicointestinal fistula: Secondary | ICD-10-CM | POA: Diagnosis present

## 2015-06-03 DIAGNOSIS — Z86010 Personal history of colonic polyps: Secondary | ICD-10-CM | POA: Diagnosis not present

## 2015-06-03 DIAGNOSIS — N8320 Unspecified ovarian cysts: Secondary | ICD-10-CM | POA: Diagnosis present

## 2015-06-03 DIAGNOSIS — K5720 Diverticulitis of large intestine with perforation and abscess without bleeding: Principal | ICD-10-CM | POA: Diagnosis present

## 2015-06-03 DIAGNOSIS — E871 Hypo-osmolality and hyponatremia: Secondary | ICD-10-CM | POA: Diagnosis present

## 2015-06-03 DIAGNOSIS — R197 Diarrhea, unspecified: Secondary | ICD-10-CM | POA: Diagnosis not present

## 2015-06-03 DIAGNOSIS — F329 Major depressive disorder, single episode, unspecified: Secondary | ICD-10-CM | POA: Diagnosis present

## 2015-06-03 DIAGNOSIS — K631 Perforation of intestine (nontraumatic): Secondary | ICD-10-CM | POA: Insufficient documentation

## 2015-06-03 DIAGNOSIS — Z515 Encounter for palliative care: Secondary | ICD-10-CM | POA: Diagnosis not present

## 2015-06-03 DIAGNOSIS — Z8249 Family history of ischemic heart disease and other diseases of the circulatory system: Secondary | ICD-10-CM

## 2015-06-03 DIAGNOSIS — R634 Abnormal weight loss: Secondary | ICD-10-CM | POA: Diagnosis not present

## 2015-06-03 DIAGNOSIS — N824 Other female intestinal-genital tract fistulae: Secondary | ICD-10-CM | POA: Insufficient documentation

## 2015-06-03 DIAGNOSIS — R109 Unspecified abdominal pain: Secondary | ICD-10-CM

## 2015-06-03 DIAGNOSIS — R609 Edema, unspecified: Secondary | ICD-10-CM | POA: Diagnosis present

## 2015-06-03 DIAGNOSIS — Z66 Do not resuscitate: Secondary | ICD-10-CM | POA: Diagnosis present

## 2015-06-03 DIAGNOSIS — E440 Moderate protein-calorie malnutrition: Secondary | ICD-10-CM | POA: Diagnosis present

## 2015-06-03 DIAGNOSIS — N739 Female pelvic inflammatory disease, unspecified: Secondary | ICD-10-CM | POA: Insufficient documentation

## 2015-06-03 LAB — CBC WITH DIFFERENTIAL/PLATELET
Basophils Absolute: 0 10*3/uL (ref 0.0–0.1)
Basophils Relative: 0 % (ref 0–1)
Eosinophils Absolute: 0 10*3/uL (ref 0.0–0.7)
Eosinophils Relative: 0 % (ref 0–5)
HCT: 36.8 % (ref 36.0–46.0)
Hemoglobin: 12.4 g/dL (ref 12.0–15.0)
Lymphocytes Relative: 15 % (ref 12–46)
Lymphs Abs: 2.6 10*3/uL (ref 0.7–4.0)
MCH: 30.8 pg (ref 26.0–34.0)
MCHC: 33.7 g/dL (ref 30.0–36.0)
MCV: 91.5 fL (ref 78.0–100.0)
Monocytes Absolute: 1.2 10*3/uL — ABNORMAL HIGH (ref 0.1–1.0)
Monocytes Relative: 7 % (ref 3–12)
Neutro Abs: 13.7 10*3/uL — ABNORMAL HIGH (ref 1.7–7.7)
Neutrophils Relative %: 78 % — ABNORMAL HIGH (ref 43–77)
Platelets: 579 10*3/uL — ABNORMAL HIGH (ref 150–400)
RBC: 4.02 MIL/uL (ref 3.87–5.11)
RDW: 13.2 % (ref 11.5–15.5)
WBC: 17.5 10*3/uL — ABNORMAL HIGH (ref 4.0–10.5)

## 2015-06-03 LAB — COMPREHENSIVE METABOLIC PANEL
ALT: 11 U/L — ABNORMAL LOW (ref 14–54)
AST: 24 U/L (ref 15–41)
Albumin: 3.1 g/dL — ABNORMAL LOW (ref 3.5–5.0)
Alkaline Phosphatase: 52 U/L (ref 38–126)
Anion gap: 10 (ref 5–15)
BUN: <5 mg/dL — ABNORMAL LOW (ref 6–20)
CO2: 27 mmol/L (ref 22–32)
Calcium: 9.2 mg/dL (ref 8.9–10.3)
Chloride: 91 mmol/L — ABNORMAL LOW (ref 101–111)
Creatinine, Ser: 0.53 mg/dL (ref 0.44–1.00)
GFR calc Af Amer: >60 mL/min (ref >60)
GFR calc non Af Amer: >60 mL/min (ref >60)
Glucose, Bld: 105 mg/dL — ABNORMAL HIGH (ref 65–99)
Potassium: 3 mmol/L — ABNORMAL LOW (ref 3.5–5.1)
Sodium: 128 mmol/L — ABNORMAL LOW (ref 135–145)
Total Bilirubin: 0.2 mg/dL — ABNORMAL LOW (ref 0.3–1.2)
Total Protein: 6.7 g/dL (ref 6.5–8.1)

## 2015-06-03 LAB — URINALYSIS, ROUTINE W REFLEX MICROSCOPIC
Bilirubin Urine: NEGATIVE
Glucose, UA: NEGATIVE mg/dL
Ketones, ur: NEGATIVE mg/dL
Nitrite: NEGATIVE
Protein, ur: 30 mg/dL — AB
Specific Gravity, Urine: 1.016 (ref 1.005–1.030)
Urobilinogen, UA: 0.2 mg/dL (ref 0.0–1.0)
pH: 7 (ref 5.0–8.0)

## 2015-06-03 LAB — GIARDIA/CRYPTOSPORIDIUM (EIA)
CRYPTOSPORIDIUM SCREEN (EIA) (SOL): NEGATIVE
GIARDIA SCREEN (EIA): NEGATIVE

## 2015-06-03 LAB — URINE MICROSCOPIC-ADD ON

## 2015-06-03 LAB — OVA AND PARASITE EXAMINATION: OP: NONE SEEN

## 2015-06-03 LAB — CLOSTRIDIUM DIFFICILE BY PCR: CDIFFPCR: NOT DETECTED

## 2015-06-03 LAB — I-STAT CG4 LACTIC ACID, ED: Lactic Acid, Venous: 0.98 mmol/L (ref 0.5–2.0)

## 2015-06-03 MED ORDER — ONDANSETRON 4 MG PO TBDP: 4.0000 mg | ORAL_TABLET | Freq: Four times a day (QID) | ORAL | Status: DC | PRN

## 2015-06-03 MED ORDER — PIPERACILLIN-TAZOBACTAM 3.375 G IVPB 30 MIN
3.3750 g | INTRAVENOUS | Status: AC
  Administered 2015-06-03: 3.375 g via INTRAVENOUS
  Filled 2015-06-03: qty 50

## 2015-06-03 MED ORDER — PIPERACILLIN-TAZOBACTAM 4.5 G IVPB: 4.5000 g | Freq: Once | INTRAVENOUS | Status: DC

## 2015-06-03 MED ORDER — MORPHINE SULFATE (PF) 2 MG/ML IV SOLN
2.0000 mg | INTRAVENOUS | Status: DC | PRN
  Administered 2015-06-08 (×2): 2 mg via INTRAVENOUS
  Filled 2015-06-03 (×2): qty 1

## 2015-06-03 MED ORDER — ONDANSETRON HCL 4 MG/2ML IJ SOLN: 4.0000 mg | Freq: Four times a day (QID) | INTRAMUSCULAR | Status: DC | PRN

## 2015-06-03 MED ORDER — ENOXAPARIN SODIUM 40 MG/0.4ML ~~LOC~~ SOLN
40.0000 mg | Freq: Every day | SUBCUTANEOUS | Status: DC
  Administered 2015-06-03 – 2015-06-08 (×6): 40 mg via SUBCUTANEOUS
  Filled 2015-06-03 (×7): qty 0.4

## 2015-06-03 MED ORDER — SODIUM CHLORIDE 0.9 % IV BOLUS (SEPSIS)
1000.0000 mL | Freq: Once | INTRAVENOUS | Status: AC
  Administered 2015-06-03: 1000 mL via INTRAVENOUS

## 2015-06-03 MED ORDER — DULOXETINE HCL 60 MG PO CPEP
60.0000 mg | ORAL_CAPSULE | Freq: Every day | ORAL | Status: DC
  Administered 2015-06-04: 60 mg via ORAL
  Filled 2015-06-03 (×2): qty 1

## 2015-06-03 MED ORDER — IOHEXOL 300 MG/ML  SOLN
100.0000 mL | Freq: Once | INTRAMUSCULAR | Status: AC | PRN
  Administered 2015-06-03: 100 mL via INTRAVENOUS

## 2015-06-03 MED ORDER — PIPERACILLIN-TAZOBACTAM 3.375 G IVPB
3.3750 g | Freq: Three times a day (TID) | INTRAVENOUS | Status: DC
  Administered 2015-06-04 – 2015-06-07 (×10): 3.375 g via INTRAVENOUS
  Filled 2015-06-03 (×11): qty 50

## 2015-06-03 MED ORDER — POTASSIUM CHLORIDE 10 MEQ/100ML IV SOLN
10.0000 meq | INTRAVENOUS | Status: AC
  Administered 2015-06-03 – 2015-06-04 (×3): 10 meq via INTRAVENOUS
  Filled 2015-06-03 (×3): qty 100

## 2015-06-03 MED ORDER — KCL IN DEXTROSE-NACL 20-5-0.9 MEQ/L-%-% IV SOLN
INTRAVENOUS | Status: DC
  Administered 2015-06-03 – 2015-06-05 (×3): via INTRAVENOUS
  Administered 2015-06-05: 1000 mL via INTRAVENOUS
  Administered 2015-06-05 – 2015-06-06 (×3): via INTRAVENOUS
  Administered 2015-06-07: 125 mL/h via INTRAVENOUS
  Filled 2015-06-03 (×12): qty 1000

## 2015-06-03 MED ORDER — PANTOPRAZOLE SODIUM 40 MG IV SOLR
40.0000 mg | Freq: Every day | INTRAVENOUS | Status: DC
  Administered 2015-06-03 – 2015-06-08 (×5): 40 mg via INTRAVENOUS
  Filled 2015-06-03 (×6): qty 40

## 2015-06-03 NOTE — Telephone Encounter (Signed)
New patient appt-s/w patient and gave np appt for 09/14 @ 10:45 w/Dr. Alen Blew.  Referring Dr. Cecille Rubin Hvozdovic Dx- leukocytosis; thrombocytosis

## 2015-06-03 NOTE — ED Provider Notes (Signed)
CSN: [id number]     Arrival date & time 06/03/15  1735 History   First MD Initiated Contact with Patient 06/03/15 1751     Chief Complaint  Patient presents with  . possible perforation     (Consider location/radiation/quality/duration/timing/severity/associated sxs/prior Treatment) HPI Patient presents to the emergency department with abdominal discomfort that started last month.  The patient was seen by her primary care doctor and placed on anti-biotics.  She states that her diarrhea and the pain did not get better.  She states that she did not was referred to GI, where she was evaluated and a CT scan was obtained.  She states that they called her following the scan today and told her that she needed to come to the emergency department for perforation or bowel.  Patient states that she feels dehydrated.  She denies chest pain, shortness of breath, nausea, vomiting, headache, blurred vision, back pain, dysuria, incontinence, or syncope.  The patient states that she has felt weak and had blood in her stool. Past Medical History  Diagnosis Date  . Arthritis   . Depression     pt states when her husband passed away. now feels fine.  . Melanoma     Chest    Past Surgical History  Procedure Laterality Date  . Abdominal hysterectomy    . Tonsillectomy    . Back surgery    . Melanoma excision     Family History  Problem Relation Age of Onset  . Hypertension Father    Social History  Substance Use Topics  . Smoking status: Current Every Day Smoker -- 1.00 packs/day for 30 years  . Smokeless tobacco: Never Used     Comment: Pt info given 05-30-2015  . Alcohol Use: 0.0 oz/week    0 Standard drinks or equivalent per week     Comment: 2 glasses of wine a week.   OB History    No data available     Review of Systems All other systems negative except as documented in the HPI. All pertinent positives and negatives as reviewed in the HPI.   Allergies  Review of patient's allergies  indicates no known allergies.  Home Medications   Prior to Admission medications   Medication Sig Start Date End Date Taking? Authorizing Provider  DULoxetine (CYMBALTA) 60 MG capsule Take 60 mg by mouth daily.   Yes Historical Provider, MD  Na Sulfate-K Sulfate-Mg Sulf SOLN Please take as directed for colonoscopy 05/30/15   Lori P Hvozdovic, PA-C   BP 128/78 mmHg  Pulse 144  Temp(Src) 97.9 F (36.6 C) (Oral)  Resp 19  SpO2 98% Physical Exam  Constitutional: She is oriented to person, place, and time. She appears well-developed and well-nourished. No distress.  HENT:  Head: Normocephalic and atraumatic.  Mouth/Throat: Oropharynx is clear and moist.  Eyes: Pupils are equal, round, and reactive to light.  Neck: Normal range of motion. Neck supple.  Cardiovascular: Normal rate, regular rhythm and normal heart sounds.  Exam reveals no gallop and no friction rub.   No murmur heard. Pulmonary/Chest: Effort normal and breath sounds normal. No respiratory distress.  Abdominal: Soft. Bowel sounds are normal. She exhibits distension. There is tenderness. There is guarding. There is no rebound.  Musculoskeletal: She exhibits no edema.  Neurological: She is alert and oriented to person, place, and time. She exhibits normal muscle tone. Coordination normal.  Nursing note and vitals reviewed.   ED Course  Procedures (including critical care time) Labs Review Labs  Reviewed  CBC WITH DIFFERENTIAL/PLATELET - Abnormal; Notable for the following:    WBC 17.5 (*)    Platelets 579 (*)    All other components within normal limits  COMPREHENSIVE METABOLIC PANEL  URINALYSIS, ROUTINE W REFLEX MICROSCOPIC (NOT AT [facility])  I-STAT CG4 LACTIC ACID, ED    Imaging Review Ct Abdomen Pelvis W Contrast  06/03/2015   CLINICAL DATA:  Patient with 2 weeks of diarrhea, lethargy and nausea/ vomiting. Low pelvic pain. Weight loss. Elevated white blood cell count. Stool coming from urethra.  EXAM: CT ABDOMEN AND  PELVIS WITH CONTRAST  TECHNIQUE: Multidetector CT imaging of the abdomen and pelvis was performed using the standard protocol following bolus administration of intravenous contrast.  CONTRAST:  155mL OMNIPAQUE IOHEXOL 300 MG/ML  SOLN  COMPARISON:  Abdominal radiographs 05/28/2015  FINDINGS: Lower chest: Normal heart size. No consolidative or nodular pulmonary opacities.  Hepatobiliary: Fatty deposition adjacent to the falciform ligament. Additional adjacent sub cm low-attenuation lesion within the left hepatic lobe too small to characterize however likely represents a small cyst. Gallbladder is decompressed. No intrahepatic or extrahepatic biliary ductal dilatation.  Pancreas: Unremarkable  Spleen: Unremarkable  Adrenals/Urinary Tract: The adrenal glands are normal. Multiple parapelvic cyst within the left renal hilum. Kidneys enhance symmetrically with contrast. There is no hydronephrosis. Wall thickening of the urinary bladder which is decompressed. There is a small amount of gas anteriorly within the urinary bladder.  Stomach/Bowel: There is circumferential wall thickening and hyperenhancement of sigmoid colon and rectum. Multiple sigmoid colonic diverticula. There is an irregular rim enhancing fluid and gas containing collection along the right aspect of the sigmoid colon with one component measuring 6.5 x 2.7 cm (image 66; series 2). More cranially there is predominantly fat tissue stranding with an additional associated fluid collection measuring 2.7 x 2.7 cm along the bladder dome (image 71; series 2). There is a fistulous connection from this overlying abscess into urinary bladder best demonstrated on image 54; series 602. Within the left hemipelvis there is a 9 x 6 cm fluid collection (image 63; series 2) which appears to represent an adnexal cyst, likely ovarian in etiology.  Vascular/Lymphatic: No retroperitoneal lymphadenopathy. Aorta is normal in caliber. Peripheral calcified atherosclerotic plaque.   Other: Small amount a gas is demonstrated non dependently within the urinary bladder. Additionally small amount of gas is demonstrated within the vagina.  Musculoskeletal: No aggressive or acute appearing osseous lesions. Lower lumbar spine degenerative changes.  IMPRESSION: The sigmoid colon rectum is markedly thick walled most compatible with severe colitis and/or diverticulitis, which has perforated. Along the right aspect of sigmoid colon there are multiple rim enhancing fluid and gas containing collections most compatible with abscesses. There is an abscess along the bladder dome with an adjacent small focus of gas within the bladder lumen and significant wall thickening of the adjacent bladder wall, compatible with colovesical fistula. 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  There is an 8 cm fluid collection within the left adnexa likely ovarian in etiology. This may represent a large ovarian cyst however superimposed infection is not excluded.  These results were called by telephone at the time of interpretation on 06/03/2015 at 3:13 pm to Dr. Cecille Rubin [facility] , who verbally acknowledged these results.   Electronically Signed   By: Lovey Newcomer M.D.   On: 06/03/2015 15:20   I have personally reviewed and evaluated these images and lab results as part of my medical decision-making. I spoke with general  surgery about the patient and they will be in to see her for evaluation.  Patient is explained the course she is given IV anabiotic and fluids    Dalia Heading, PA-C 06/03/15 1904  Lacretia Leigh, MD 06/10/15 1230

## 2015-06-03 NOTE — H&P (Signed)
[name] is an 54 y.o. female.    Chief Complaint: Diarrhea, abdominal pain  HPI: Patient is a 54 year old female who presents with an approximately one month gradually worsening illness. She initially began having diarrhea approximately one month ago. On my questioning this was associated with some lower abdominal pain but not severe. She initially was seen by primary care and stool cultures and C. Difficile were obtained and she was started on Cipro empirically.  Cultures and C. Difficile were negative. She had no improvement on the Cipro. Her illness gradually worsened. She presented back to primary care approximately one week ago with persistent diarrhea and some increase in lower abdominal pain. She was also found to have worsening leukocytosis. She was referred to GI for evaluation which was performed on September 9. A CT scan of the abdomen and pelvis was ordered at that time and obtained today showing significant findings as described below and she was told to come to the emergency department because of the CT findings. The patient states that she has been having stool and also gas with urination for at least 3 weeks. She feels she is also having gas per vagina and possibly some stool leakage as well. Her abdominal pain has been left lower quadrant and right lower quadrant, intermittent and occasionally somewhat severe but not constant. She has had occasional nausea without vomiting and lack of appetite.She actually denies any severe abdominal pain this evening. No apparent fever or chills although she has been having some night sweats. Also malaise and weakness and she has lost almost 15 pounds. She has a history of colonoscopy with findings of a colon polyp about 6 years ago but no other history of GI illness or colitis or any chronic GI complaints.  Past Medical History  Diagnosis Date  . Arthritis   . Depression     pt states when her husband passed away. now feels fine.  . Melanoma      Chest     Past Surgical History  Procedure Laterality Date  . Abdominal hysterectomy    . Tonsillectomy    . Back surgery    . Melanoma excision      Family History  Problem Relation Age of Onset  . Hypertension Father    Social History:  reports that she has been smoking.  She has never used smokeless tobacco. She reports that she drinks alcohol. Her drug history is not on file.  Allergies: No Known Allergies   Current Facility-Administered Medications  Medication Dose Route Frequency Provider Last Rate Last Dose  . piperacillin-tazobactam (ZOSYN) IVPB 3.375 g  3.375 g Intravenous STAT Christopher Lawyer, PA-C      . sodium chloride 0.9 % bolus 1,000 mL  1,000 mL Intravenous Once Dalia Heading, PA-C       Current Outpatient Prescriptions  Medication Sig Dispense Refill  . DULoxetine (CYMBALTA) 60 MG capsule Take 60 mg by mouth daily.    . Na Sulfate-K Sulfate-Mg Sulf SOLN Please take as directed for colonoscopy 354 mL 0     Results for orders placed or performed during the hospital encounter of 06/03/15 (from the past 48 hour(s))  I-Stat CG4 Lactic Acid, ED     Status: None   Collection Time: 06/03/15  6:42 PM  Result Value Ref Range   Lactic Acid, Venous 0.98 0.5 - 2.0 mmol/L  Comprehensive metabolic panel     Status: Abnormal   Collection Time: 06/03/15  6:43 PM  Result Value Ref Range  Sodium 128 (L) 135 - 145 mmol/L   Potassium 3.0 (L) 3.5 - 5.1 mmol/L   Chloride 91 (L) 101 - 111 mmol/L   CO2 27 22 - 32 mmol/L   Glucose, Bld 105 (H) 65 - 99 mg/dL   BUN <5 (L) 6 - 20 mg/dL   Creatinine, Ser 0.53 0.44 - 1.00 mg/dL   Calcium 9.2 8.9 - 10.3 mg/dL   Total Protein 6.7 6.5 - 8.1 g/dL   Albumin 3.1 (L) 3.5 - 5.0 g/dL   AST 24 15 - 41 U/L   ALT 11 (L) 14 - 54 U/L   Alkaline Phosphatase 52 38 - 126 U/L   Total Bilirubin 0.2 (L) 0.3 - 1.2 mg/dL   GFR calc non Af Amer >60 >60 mL/min   GFR calc Af Amer >60 >60 mL/min    Comment: (NOTE) The eGFR has been  calculated using the CKD EPI equation. This calculation has not been validated in all clinical situations. eGFR's persistently <60 mL/min signify possible Chronic Kidney Disease.    Anion gap 10 5 - 15  CBC with Differential     Status: Abnormal (Preliminary result)   Collection Time: 06/03/15  6:43 PM  Result Value Ref Range   WBC 17.5 (H) 4.0 - 10.5 K/uL   RBC 4.02 3.87 - 5.11 MIL/uL   Hemoglobin 12.4 12.0 - 15.0 g/dL   HCT 36.8 36.0 - 46.0 %   MCV 91.5 78.0 - 100.0 fL   MCH 30.8 26.0 - 34.0 pg   MCHC 33.7 30.0 - 36.0 g/dL   RDW 13.2 11.5 - 15.5 %   Platelets 579 (H) 150 - 400 K/uL   Neutrophils Relative % PENDING 43 - 77 %   Neutro Abs PENDING 1.7 - 7.7 K/uL   Band Neutrophils PENDING 0 - 10 %   Lymphocytes Relative PENDING 12 - 46 %   Lymphs Abs PENDING 0.7 - 4.0 K/uL   Monocytes Relative PENDING 3 - 12 %   Monocytes Absolute PENDING 0.1 - 1.0 K/uL   Eosinophils Relative PENDING 0 - 5 %   Eosinophils Absolute PENDING 0.0 - 0.7 K/uL   Basophils Relative PENDING 0 - 1 %   Basophils Absolute PENDING 0.0 - 0.1 K/uL   WBC Morphology PENDING    RBC Morphology PENDING    Smear Review PENDING    nRBC PENDING 0 /100 WBC   Metamyelocytes Relative PENDING %   Myelocytes PENDING %   Promyelocytes Absolute PENDING %   Blasts PENDING %   Ct Abdomen Pelvis W Contrast  06/03/2015   CLINICAL DATA:  Patient with 2 weeks of diarrhea, lethargy and nausea/ vomiting. Low pelvic pain. Weight loss. Elevated white blood cell count. Stool coming from urethra.  EXAM: CT ABDOMEN AND PELVIS WITH CONTRAST  TECHNIQUE: Multidetector CT imaging of the abdomen and pelvis was performed using the standard protocol following bolus administration of intravenous contrast.  CONTRAST:  180m OMNIPAQUE IOHEXOL 300 MG/ML  SOLN  COMPARISON:  Abdominal radiographs 05/28/2015  FINDINGS: Lower chest: Normal heart size. No consolidative or nodular pulmonary opacities.  Hepatobiliary: Fatty deposition adjacent to the  falciform ligament. Additional adjacent sub cm low-attenuation lesion within the left hepatic lobe too small to characterize however likely represents a small cyst. Gallbladder is decompressed. No intrahepatic or extrahepatic biliary ductal dilatation.  Pancreas: Unremarkable  Spleen: Unremarkable  Adrenals/Urinary Tract: The adrenal glands are normal. Multiple parapelvic cyst within the left renal hilum. Kidneys enhance symmetrically with contrast. There is no  hydronephrosis. Wall thickening of the urinary bladder which is decompressed. There is a small amount of gas anteriorly within the urinary bladder.  Stomach/Bowel: There is circumferential wall thickening and hyperenhancement of sigmoid colon and rectum. Multiple sigmoid colonic diverticula. There is an irregular rim enhancing fluid and gas containing collection along the right aspect of the sigmoid colon with one component measuring 6.5 x 2.7 cm (image 66; series 2). More cranially there is predominantly fat tissue stranding with an additional associated fluid collection measuring 2.7 x 2.7 cm along the bladder dome (image 71; series 2). There is a fistulous connection from this overlying abscess into urinary bladder best demonstrated on image 54; series 602. Within the left hemipelvis there is a 9 x 6 cm fluid collection (image 63; series 2) which appears to represent an adnexal cyst, likely ovarian in etiology.  Vascular/Lymphatic: No retroperitoneal lymphadenopathy. Aorta is normal in caliber. Peripheral calcified atherosclerotic plaque.  Other: Small amount a gas is demonstrated non dependently within the urinary bladder. Additionally small amount of gas is demonstrated within the vagina.  Musculoskeletal: No aggressive or acute appearing osseous lesions. Lower lumbar spine degenerative changes.  IMPRESSION: The sigmoid colon rectum is markedly thick walled most compatible with severe colitis and/or diverticulitis, which has perforated. Along the right  aspect of sigmoid colon there are multiple rim enhancing fluid and gas containing collections most compatible with abscesses. There is an abscess along the bladder dome with an adjacent small focus of gas within the bladder lumen and significant wall thickening of the adjacent bladder wall, compatible with colovesical fistula. 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  There is an 8 cm fluid collection within the left adnexa likely ovarian in etiology. This may represent a large ovarian cyst however superimposed infection is not excluded.  These results were called by telephone at the time of interpretation on 06/03/2015 at 3:13 pm to Dr. Cecille Rubin [facility] , who verbally acknowledged these results.   Electronically Signed   By: Lovey Newcomer M.D.   On: 06/03/2015 15:20    Review of Systems  Constitutional: Positive for malaise/fatigue and diaphoresis. Negative for fever and chills.  HENT: Negative.   Respiratory: Negative.   Cardiovascular: Negative.   Gastrointestinal: Positive for nausea, abdominal pain, diarrhea and blood in stool. Negative for vomiting.  Genitourinary: Positive for dysuria, urgency and frequency.  Musculoskeletal: Positive for back pain.  Neurological: Negative.   Psychiatric/Behavioral: Negative.     Blood pressure 124/81, pulse 116, temperature 97.9 F (36.6 C), temperature source Oral, resp. rate 18, SpO2 96 %. Physical Exam  General: Alert, Somewhat chronically ill-appearing Caucasian female, in no acute distress Skin: Warm and dry without rash or infection. Healed transverse incision up her chest without nodules HEENT: No palpable masses or thyromegaly. Sclera nonicteric. Pupils equal round and reactive. Tongue coated Lymph nodes: No cervical, supraclavicular, Axillary, or inguinal nodes palpable. Lungs: Breath sounds clear and equal without increased work of breathing Cardiovascular: Regular Tachycardiawithout murmur. No JVD or  edema. Peripheral pulses intact. Abdomen: Nondistended. Mild to moderate lower abdominal tenderness without guarding. Fullness or mass palpable in the midline and left lower quadrant. No organomegaly. No palpable hernias. Extremities: No edema or joint swelling or deformity. No chronic venous stasis changes. Neurologic: Alert and fully oriented. Gait normal.  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 Malignancy possible but seems less likely on imaging. The patient has  ongoing illness for 1 month which is not particularly acute over the last several days but presents to the emergency department due to an elective CT scan that was performed today. She does not need emergency surgery. I have recommended hospitalization and IV antibiotics. Interventional radiology consult for consideration for percutaneous drainage. I have asked GI to evaluate as I think at some point flexible sigmoidoscopy would be helpful to confirm the diagnosis. There is a chance that things could be calm down nonoperatively to where she could be prepped for an elective procedure and colectomy with anastomosis. May require Hartmann colectomy or diverting colostomy if she does not improve. She has hyponatremia and hypokalemia that will be treated.  Jonesha Tsuchiya T 06/03/2015, 7:45 PM

## 2015-06-03 NOTE — Telephone Encounter (Signed)
Spoke to patient and explained CT report to her. I have spoken to hospitalist and was advised to send patient through emergency room so that she could have labs, IV,antibiotics, surgical consult, etc. in ER. I have spoken to ER physician as well and made her aware of the patient. Patient has been made aware that the ER is aware of her impending visit and states she will head to the ER now.

## 2015-06-03 NOTE — ED Notes (Signed)
Pt was told to come here due to her CT results that her doctor received today.  Pt's doctor already spoke with ED provider here about pt's Ct and treatment plan.  Ct showed possible bowel perforation.

## 2015-06-04 ENCOUNTER — Inpatient Hospital Stay (HOSPITAL_COMMUNITY): Payer: BLUE CROSS/BLUE SHIELD

## 2015-06-04 ENCOUNTER — Ambulatory Visit: Payer: BLUE CROSS/BLUE SHIELD | Admitting: Oncology

## 2015-06-04 DIAGNOSIS — K631 Perforation of intestine (nontraumatic): Secondary | ICD-10-CM

## 2015-06-04 DIAGNOSIS — E440 Moderate protein-calorie malnutrition: Secondary | ICD-10-CM | POA: Insufficient documentation

## 2015-06-04 LAB — CBC
HEMATOCRIT: 31.5 % — AB (ref 36.0–46.0)
Hemoglobin: 10.5 g/dL — ABNORMAL LOW (ref 12.0–15.0)
MCH: 31.2 pg (ref 26.0–34.0)
MCHC: 33.3 g/dL (ref 30.0–36.0)
MCV: 93.5 fL (ref 78.0–100.0)
PLATELETS: 615 10*3/uL — AB (ref 150–400)
RBC: 3.37 MIL/uL — ABNORMAL LOW (ref 3.87–5.11)
RDW: 13.6 % (ref 11.5–15.5)
WBC: 10.5 10*3/uL (ref 4.0–10.5)

## 2015-06-04 LAB — PROTIME-INR
INR: 1.09 (ref 0.00–1.49)
PROTHROMBIN TIME: 14.3 s (ref 11.6–15.2)

## 2015-06-04 LAB — BASIC METABOLIC PANEL
ANION GAP: 6 (ref 5–15)
CALCIUM: 8.8 mg/dL — AB (ref 8.9–10.3)
CO2: 29 mmol/L (ref 22–32)
Chloride: 101 mmol/L (ref 101–111)
Creatinine, Ser: 0.49 mg/dL (ref 0.44–1.00)
GFR calc Af Amer: >60 mL/min (ref >60)
GLUCOSE: 120 mg/dL — AB (ref 65–99)
Potassium: 3.6 mmol/L (ref 3.5–5.1)
Sodium: 136 mmol/L (ref 135–145)

## 2015-06-04 MED ORDER — MIDAZOLAM HCL 2 MG/2ML IJ SOLN
INTRAMUSCULAR | Status: AC
  Filled 2015-06-04: qty 4

## 2015-06-04 MED ORDER — MIDAZOLAM HCL 2 MG/2ML IJ SOLN
INTRAMUSCULAR | Status: AC | PRN
  Administered 2015-06-04: 0.5 mg via INTRAVENOUS
  Administered 2015-06-04 (×2): 1 mg via INTRAVENOUS

## 2015-06-04 MED ORDER — FENTANYL CITRATE (PF) 100 MCG/2ML IJ SOLN
INTRAMUSCULAR | Status: AC
  Filled 2015-06-04: qty 4

## 2015-06-04 MED ORDER — FENTANYL CITRATE (PF) 100 MCG/2ML IJ SOLN
INTRAMUSCULAR | Status: AC | PRN
  Administered 2015-06-04: 25 ug via INTRAVENOUS
  Administered 2015-06-04: 50 ug via INTRAVENOUS

## 2015-06-04 NOTE — Procedures (Signed)
Interventional Radiology Procedure Note  Procedure:  1.) Attempted transgluteal drain placement.  No window into drainable abscess.  2.) Successful placement of a 81F drain into the anterior abscess at the bladder dome. Aspiration yields 10 mL thick pus   Complications: None  Estimated Blood Loss: 0  Recommendations: - Drain to JP for now, will eventually switch to gravity bag.  - Cx pending  Signed,  Criselda Peaches, MD

## 2015-06-04 NOTE — Consult Note (Signed)
Chief Complaint: Patient was seen in consultation today for CT guided drainage of pelvic abscess(es) Chief Complaint  Patient presents with  . possible perforation    Referring Physician(s): CCS  History of Present Illness: [name] is a 54 y.o. female recently admitted with abdominal pain, diarrhea-C. difficile negative, leukocytosis, nausea, diminished appetite, weight loss, weakness , night sweats and reported history of gas and stool via vagina and urine. Subsequent CT scan of the abdomen and pelvis on 06/03/15 revealed markedly thickened sigmoid colon wall most compatible with severe colitis and/or diverticulitis with perforation. There were multiple rim enhancing fluid and gas collections along the right aspect of the sigmoid colon consistent with abscesses. In addition there was an abscess along the bladder dome with an adjacent small focus of gas within the bladder lumen and significant wall thickening of the adjacent bladder wall compatible with colovesical fistula as well as a small amount of gas within the vaginal fornix concerning for colovaginal fistula. Patient was seen by surgery and request is now received for CT guided drainage of pelvic abscess(es).  Past Medical History  Diagnosis Date  . Arthritis   . Depression     pt states when her husband passed away. now feels fine.  . Melanoma     Chest     Past Surgical History  Procedure Laterality Date  . Abdominal hysterectomy    . Tonsillectomy    . Back surgery    . Melanoma excision      Allergies: Review of patient's allergies indicates no known allergies.  Medications: Prior to Admission medications   Medication Sig Start Date End Date Taking? Authorizing Provider  DULoxetine (CYMBALTA) 60 MG capsule Take 60 mg by mouth daily.   Yes Historical Provider, MD  Na Sulfate-K Sulfate-Mg Sulf SOLN Please take as directed for colonoscopy 05/30/15   Lori P Hvozdovic, PA-C     Family History  Problem  Relation Age of Onset  . Hypertension Father     Social History   Social History  . Marital Status: Widowed    Spouse Name: N/A  . Number of Children: N/A  . Years of Education: N/A   Social History Main Topics  . Smoking status: Current Every Day Smoker -- 1.00 packs/day for 30 years  . Smokeless tobacco: Never Used     Comment: Pt info given 05-30-2015  . Alcohol Use: 0.0 oz/week    0 Standard drinks or equivalent per week     Comment: 2 glasses of wine a week.  . Drug Use: None  . Sexual Activity: No   Other Topics Concern  . None   Social History Narrative     Review of Systems currently patient denies fever, chills, headache, chest pain, dyspnea, cough, worsening abdominal pain, nausea, vomiting or abnormal bleeding.  Vital Signs: BP 142/71 mmHg  Pulse 90  Temp(Src) 97.6 F (36.4 C) (Oral)  Resp 18  Ht 5' 1.5" (1.562 m)  Wt 133 lb (60.328 kg)  BMI 24.73 kg/m2  SpO2 97%  Physical Exam patient awake, alert. Chest- distant but clear breath sounds bilaterally. Heart with regular rate and rhythm. Abdomen soft, mildly tender lower quadrant suprapubic regions. Extremities with full range of motion and no edema.  Mallampati Score:     Imaging: Dg Abd 1 View  05/28/2015   CLINICAL DATA:  One month history of diarrhea, leaking of stool this past weekend  EXAM: ABDOMEN - 1 VIEW  COMPARISON:  None in PACs  FINDINGS: There is a moderate amount of gas throughout the colon. There is a moderate stool burden as well. There is no evidence of a fecal impaction however. No evidence of small-bowel obstruction is a apparent. No free extraluminal gas collections are demonstrated. There are degenerative changes of the lower lumbar discs. There is degenerative change of the left hip.  IMPRESSION: The bowel gas pattern is not clearly abnormal. There may be mildly increased colonic stool burden but there is no evidence of fecal impaction.   Electronically Signed   By: David  Martinique M.D.    On: 05/28/2015 10:43   Ct Abdomen Pelvis W Contrast  06/03/2015   CLINICAL DATA:  Patient with 2 weeks of diarrhea, lethargy and nausea/ vomiting. Low pelvic pain. Weight loss. Elevated white blood cell count. Stool coming from urethra.  EXAM: CT ABDOMEN AND PELVIS WITH CONTRAST  TECHNIQUE: Multidetector CT imaging of the abdomen and pelvis was performed using the standard protocol following bolus administration of intravenous contrast.  CONTRAST:  113mL OMNIPAQUE IOHEXOL 300 MG/ML  SOLN  COMPARISON:  Abdominal radiographs 05/28/2015  FINDINGS: Lower chest: Normal heart size. No consolidative or nodular pulmonary opacities.  Hepatobiliary: Fatty deposition adjacent to the falciform ligament. Additional adjacent sub cm low-attenuation lesion within the left hepatic lobe too small to characterize however likely represents a small cyst. Gallbladder is decompressed. No intrahepatic or extrahepatic biliary ductal dilatation.  Pancreas: Unremarkable  Spleen: Unremarkable  Adrenals/Urinary Tract: The adrenal glands are normal. Multiple parapelvic cyst within the left renal hilum. Kidneys enhance symmetrically with contrast. There is no hydronephrosis. Wall thickening of the urinary bladder which is decompressed. There is a small amount of gas anteriorly within the urinary bladder.  Stomach/Bowel: There is circumferential wall thickening and hyperenhancement of sigmoid colon and rectum. Multiple sigmoid colonic diverticula. There is an irregular rim enhancing fluid and gas containing collection along the right aspect of the sigmoid colon with one component measuring 6.5 x 2.7 cm (image 66; series 2). More cranially there is predominantly fat tissue stranding with an additional associated fluid collection measuring 2.7 x 2.7 cm along the bladder dome (image 71; series 2). There is a fistulous connection from this overlying abscess into urinary bladder best demonstrated on image 54; series 602. Within the left hemipelvis  there is a 9 x 6 cm fluid collection (image 63; series 2) which appears to represent an adnexal cyst, likely ovarian in etiology.  Vascular/Lymphatic: No retroperitoneal lymphadenopathy. Aorta is normal in caliber. Peripheral calcified atherosclerotic plaque.  Other: Small amount a gas is demonstrated non dependently within the urinary bladder. Additionally small amount of gas is demonstrated within the vagina.  Musculoskeletal: No aggressive or acute appearing osseous lesions. Lower lumbar spine degenerative changes.  IMPRESSION: The sigmoid colon rectum is markedly thick walled most compatible with severe colitis and/or diverticulitis, which has perforated. Along the right aspect of sigmoid colon there are multiple rim enhancing fluid and gas containing collections most compatible with abscesses. There is an abscess along the bladder dome with an adjacent small focus of gas within the bladder lumen and significant wall thickening of the adjacent bladder wall, compatible with colovesical fistula. 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  There is an 8 cm fluid collection within the left adnexa likely ovarian in etiology. This may represent a large ovarian cyst however superimposed infection is not excluded.  These results were called by telephone at the time of interpretation on 06/03/2015 at 3:13 pm to  Dr. Cecille Rubin HVOZDOVIC , who verbally acknowledged these results.   Electronically Signed   By: Lovey Newcomer M.D.   On: 06/03/2015 15:20    Labs:  CBC:  Recent Labs  05/28/15 0948 05/30/15 1614 06/03/15 1843 06/04/15 0448  WBC 18.3 Repeated and verified X2.* 21.2 Repeated and verified X2.* 17.5* 10.5  HGB 12.5 12.7 12.4 10.5*  HCT 38.0 38.1 36.8 31.5*  PLT 954.0* 949.0* 579* 615*    COAGS: No results for input(s): INR, APTT in the last 8760 hours.  BMP:  Recent Labs  05/28/15 0948 05/30/15 1614 06/03/15 1843 06/04/15 0448  NA 128* 128* 128* 136  K 4.3  3.9 3.0* 3.6  CL 90* 88* 91* 101  CO2 28 29 27 29   GLUCOSE 109* 101* 105* 120*  BUN 7 6 <5* <5*  CALCIUM 9.6 9.9 9.2 8.8*  CREATININE 0.48 0.41 0.53 0.49  GFRNONAA  --   --  >60 >60  GFRAA  --   --  >60 >60    LIVER FUNCTION TESTS:  Recent Labs  05/23/15 0824 05/28/15 0948 05/30/15 1614 06/03/15 1843  BILITOT 0.5 0.4 0.4 0.2*  AST 11 10 9 24   ALT 10 7 6  11*  ALKPHOS 81 64 63 52  PROT 7.1 6.8 7.3 6.7  ALBUMIN 3.3* 3.1* 3.3* 3.1*    TUMOR MARKERS: No results for input(s): AFPTM, CEA, CA199, CHROMGRNA in the last 8760 hours.  Assessment and Plan: Patient with recent history of abdominal pain, diarrhea, leukocytosis, appearance of stool and gas via vagina and urine and CT findings of multiple pelvic abscesses,?diverticular origin as well as possible colovesical and colovaginal fistulas. Request now received for CT guided drainage of the pelvic abscesses. Imaging studies have been reviewed by Dr. Laurence Ferrari. Tentative plan is for follow-up CT today with rectal contrast and possible abscess drainage if safely accessible. Details/risks of procedure, including but not limited to, internal bleeding, infection/sepsis, injury to adjacent organs, need for emergent surgery, and inability to drain abscesses, discussed with patient and mother with their understanding and consent.   Thank you for this interesting consult.  I greatly enjoyed meeting [name] and look forward to participating in their care.  A copy of this report was sent to the requesting provider on this date.  Signed: D. Rowe Robert 06/04/2015, 11:27 AM   I spent a total of 20 minutes in face to face in clinical consultation, greater than 50% of which was counseling/coordinating care for CT-guided pelvic abscess drainage

## 2015-06-04 NOTE — Consult Note (Signed)
Consultation  Referring Provider:    Excell Seltzer, MD  Primary Care Physician:  Mackie Pai, PA-C Primary Gastroenterologist:    Zenovia Jarred, MD     Reason for Consultation:     Sigmoid perforation / abscess, diarrhea         HPI:   [name] is a 54 y.o. 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 She reports she developed diarrhea about a month ago during travel [place]. She underwent an infectious workup which did not yield any etiology, and was treated with empiric antibiotics, which did not help her symptoms. She has also had some lower abdominal pain over this time. Labs showed significant leukocytosis. 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 She was admitted last night and antibiotics were started. WBC significantly improved today and she reports improved pain control. She has lost about 15 lbs during this course. She denies any prior bowel symptoms prior to onset of this acute illness. Prior colonoscopy 5-6 years ago did not show any inflammatory changes, one benign polyp. She does endorse seeing stool in her urine, which is new for her. She was evaluated by general surgery last night. No FH of CRC or IBD. She is an active smoker. She has taken mobic as an outpatient.   Past Medical History  Diagnosis Date  . Arthritis   . Depression     pt states when her husband passed away. now feels fine.  . Melanoma     Chest     Past Surgical History  Procedure Laterality Date  . Abdominal hysterectomy    . Tonsillectomy    . Back surgery    . Melanoma excision      Family History  Problem Relation Age of Onset  . Hypertension Father    No IBD or CRC  Social History  Substance Use Topics  . Smoking status: Current Every Day Smoker -- 1.00 packs/day for 30 years  . Smokeless  tobacco: Never Used     Comment: Pt info given 05-30-2015  . Alcohol Use: 0.0 oz/week    0 Standard drinks or equivalent per week     Comment: 2 glasses of wine a week.    Prior to Admission medications   Medication Sig Start Date End Date Taking? Authorizing Provider  DULoxetine (CYMBALTA) 60 MG capsule Take 60 mg by mouth daily.   Yes Historical Provider, MD  Na Sulfate-K Sulfate-Mg Sulf SOLN Please take as directed for colonoscopy 05/30/15   Lori P Hvozdovic, PA-C    Current Facility-Administered Medications  Medication Dose Route Frequency Provider Last Rate Last Dose  . dextrose 5 % and 0.9 % NaCl with KCl 20 mEq/L infusion   Intravenous Continuous Excell Seltzer, MD 100 mL/hr at 06/03/15 2309    . DULoxetine (CYMBALTA) DR capsule 60 mg  60 mg Oral Daily Excell Seltzer, MD      . enoxaparin (LOVENOX) injection 40 mg  40 mg Subcutaneous QHS Excell Seltzer, MD   40 mg at 06/03/15 2346  . morphine 2 MG/ML injection 2-6 mg  2-6 mg Intravenous Q2H PRN Excell Seltzer, MD      . ondansetron (ZOFRAN-ODT) disintegrating tablet 4 mg  4 mg Oral Q6H PRN Excell Seltzer, MD       Or  . ondansetron [facility]) injection 4 mg  4  mg Intravenous Q6H PRN Excell Seltzer, MD      . pantoprazole (PROTONIX) injection 40 mg  40 mg Intravenous QHS Excell Seltzer, MD   40 mg at 06/03/15 2346  . piperacillin-tazobactam (ZOSYN) IVPB 3.375 g  3.375 g Intravenous Q8H Excell Seltzer, MD   3.375 g at 06/04/15 0441    Allergies as of 06/03/2015  . (No Known Allergies)     Review of Systems:    As per HPI, otherwise negative    Physical Exam:  Vital signs in last 24 hours: Temp:  [97.9 F (36.6 C)-98.4 F (36.9 C)] 98.3 F (36.8 C) (09/14 0552) Pulse Rate:  [98-144] 98 (09/14 0552) Resp:  [18-19] 18 (09/14 0552) BP: (109-140)/(70-94) 130/94 mmHg (09/14 0552) SpO2:  [96 %-98 %] 96 % (09/14 0552) Weight:  [133 lb (60.328 kg)] 133 lb (60.328 kg) (09/13 2218) Last BM Date:  06/03/15 General:   Pleasant white femaile in NAD Head:  Normocephalic and atraumatic. Eyes:   No icterus.   Conjunctiva pink. Ears:  Normal auditory acuity. Neck:  Supple Lungs:  Respirations even and unlabored. Lungs clear to auscultation bilaterally.   No wheezes, crackles, or rhonchi.  Heart:  Regular rate and rhythm; no MRG Abdomen:  Soft, nondistended, lower abdomen TTP without peritoneal signs. Normal bowel sounds. No appreciable masses or hepatomegaly.  Rectal:  Not performed.  Msk:  Symmetrical without gross deformities.  Extremities:  Without edema. Neurologic:  Alert and  oriented x4;  grossly normal neurologically. Skin:  Intact without significant lesions or rashes. Psych:  Alert and cooperative. Normal affect.  LAB RESULTS:  Recent Labs  06/03/15 1843 06/04/15 0448  WBC 17.5* 10.5  HGB 12.4 10.5*  HCT 36.8 31.5*  PLT 579* 615*   BMET  Recent Labs  06/03/15 1843 06/04/15 0448  NA 128* 136  K 3.0* 3.6  CL 91* 101  CO2 27 29  GLUCOSE 105* 120*  BUN <5* <5*  CREATININE 0.53 0.49  CALCIUM 9.2 8.8*   LFT  Recent Labs  06/03/15 1843  PROT 6.7  ALBUMIN 3.1*  AST 24  ALT 11*  ALKPHOS 52  BILITOT 0.2*   PT/INR No results for input(s): LABPROT, INR in the last 72 hours.  STUDIES: Ct Abdomen Pelvis W Contrast  06/03/2015   CLINICAL DATA:  Patient with 2 weeks of diarrhea, lethargy and nausea/ vomiting. Low pelvic pain. Weight loss. Elevated white blood cell count. Stool coming from urethra.  EXAM: CT ABDOMEN AND PELVIS WITH CONTRAST  TECHNIQUE: Multidetector CT imaging of the abdomen and pelvis was performed using the standard protocol following bolus administration of intravenous contrast.  CONTRAST:  133mL OMNIPAQUE IOHEXOL 300 MG/ML  SOLN  COMPARISON:  Abdominal radiographs 05/28/2015  FINDINGS: Lower chest: Normal heart size. No consolidative or nodular pulmonary opacities.  Hepatobiliary: Fatty deposition adjacent to the falciform ligament. Additional  adjacent sub cm low-attenuation lesion within the left hepatic lobe too small to characterize however likely represents a small cyst. Gallbladder is decompressed. No intrahepatic or extrahepatic biliary ductal dilatation.  Pancreas: Unremarkable  Spleen: Unremarkable  Adrenals/Urinary Tract: The adrenal glands are normal. Multiple parapelvic cyst within the left renal hilum. Kidneys enhance symmetrically with contrast. There is no hydronephrosis. Wall thickening of the urinary bladder which is decompressed. There is a small amount of gas anteriorly within the urinary bladder.  Stomach/Bowel: There is circumferential wall thickening and hyperenhancement of sigmoid colon and rectum. Multiple sigmoid colonic diverticula. There is an irregular rim enhancing fluid and gas  containing collection along the right aspect of the sigmoid colon with one component measuring 6.5 x 2.7 cm (image 66; series 2). More cranially there is predominantly fat tissue stranding with an additional associated fluid collection measuring 2.7 x 2.7 cm along the bladder dome (image 71; series 2). There is a fistulous connection from this overlying abscess into urinary bladder best demonstrated on image 54; series 602. Within the left hemipelvis there is a 9 x 6 cm fluid collection (image 63; series 2) which appears to represent an adnexal cyst, likely ovarian in etiology.  Vascular/Lymphatic: No retroperitoneal lymphadenopathy. Aorta is normal in caliber. Peripheral calcified atherosclerotic plaque.  Other: Small amount a gas is demonstrated non dependently within the urinary bladder. Additionally small amount of gas is demonstrated within the vagina.  Musculoskeletal: No aggressive or acute appearing osseous lesions. Lower lumbar spine degenerative changes.  IMPRESSION: The sigmoid colon rectum is markedly thick walled most compatible with severe colitis and/or diverticulitis, which has perforated. Along the right aspect of sigmoid colon there  are multiple rim enhancing fluid and gas containing collections most compatible with abscesses. There is an abscess along the bladder dome with an adjacent small focus of gas within the bladder lumen and significant wall thickening of the adjacent bladder wall, compatible with colovesical fistula. 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  There is an 8 cm fluid collection within the left adnexa likely ovarian in etiology. This may represent a large ovarian cyst however superimposed infection is not excluded.  These results were called by telephone at the time of interpretation on 06/03/2015 at 3:13 pm to Dr. Cecille Rubin [employer] , who verbally acknowledged these results.   Electronically Signed   By: Lovey Newcomer M.D.   On: 06/03/2015 15:20     PREVIOUS ENDOSCOPIES:            Colonoscopy 5-6 years ago, polyp removed, no inflammatory changes   Impression / Plan:  54 y/o female, previously healthy without bowel symptoms, presenting with one month history of severe diarrhea, abdominal pain, and new leukocytosis, found to have severe distal colon inflammation with abscess formation and colovesical / colovaginal fistula. She has responded well to IV antibiotics overnight with significant downtrend in her leukocytosis.   Unclear if this presentation is due to diverticulitis (findings on CT in approximation to diverticulosis) or underlying IBD (would favor Crohn's in this setting). Ultimately endoscopy will help clarify the etiology and assess for IBD. Agree with IR guided abscess drainage first and continuation for antibiotics. We can perform endoscopy with biopsies after she has been drained and cooled off a bit, perhaps tomorrow or Friday, which can also help determine timing of surgery. If this ultimately does turn out to be IBD, regarding her options for medical therapy, given the severity of her perforating disease with fistula formation, she would warrant aggressive  immunosuppression up front with anti-TNF therapy, likely in conjunction with immunomodulator, once her infection is controlled. Will await her course and follow with you, standing by for endoscopy in the near future and discuss surgical plans.  Please call with questions / concerns, or changes in her status. Thanks  Bucoda Cellar, MD [facility] Pager [phone]    LOS: 1 day   Renelda Loma Cederick Broadnax  06/04/2015, 8:06 AM

## 2015-06-04 NOTE — Progress Notes (Signed)
[facility] Progress Note     Subjective: Pt doing much better since medication administration.  No N/V, no BM, not much flatus.  Able to ambulate OOB some.  NPO.  Admits to air/feces coming out in urine and from vagina.  Objective: Vital signs in last 24 hours: Temp:  [97.9 F (36.6 C)-98.4 F (36.9 C)] 98.3 F (36.8 C) (09/14 0552) Pulse Rate:  [98-144] 98 (09/14 0552) Resp:  [18-19] 18 (09/14 0552) BP: (109-140)/(70-94) 130/94 mmHg (09/14 0552) SpO2:  [96 %-98 %] 96 % (09/14 0552) Weight:  [60.328 kg (133 lb)] 60.328 kg (133 lb) (09/13 2218) Last BM Date: 06/03/15  Intake/Output from previous day: 09/13 0701 - 09/14 0700 In: 500 [I.V.:200; IV Piggyback:300] Out: 850 [Urine:850] Intake/Output this shift:    PE: Gen:  Alert, NAD, pleasant Card:  RRR, no M/G/R heard Pulm:  CTA, no W/R/R Abd: Soft, tender over suprapubic and RLQ/LLQ, +BS, no HSM   Lab Results:   Recent Labs  06/03/15 1843 06/04/15 0448  WBC 17.5* 10.5  HGB 12.4 10.5*  HCT 36.8 31.5*  PLT 579* 615*   BMET  Recent Labs  06/03/15 1843 06/04/15 0448  NA 128* 136  K 3.0* 3.6  CL 91* 101  CO2 27 29  GLUCOSE 105* 120*  BUN <5* <5*  CREATININE 0.53 0.49  CALCIUM 9.2 8.8*   PT/INR No results for input(s): LABPROT, INR in the last 72 hours. CMP     Component Value Date/Time   NA 136 06/04/2015 0448   K 3.6 06/04/2015 0448   CL 101 06/04/2015 0448   CO2 29 06/04/2015 0448   GLUCOSE 120* 06/04/2015 0448   BUN <5* 06/04/2015 0448   CREATININE 0.49 06/04/2015 0448   CALCIUM 8.8* 06/04/2015 0448   PROT 6.7 06/03/2015 1843   ALBUMIN 3.1* 06/03/2015 1843   AST 24 06/03/2015 1843   ALT 11* 06/03/2015 1843   ALKPHOS 52 06/03/2015 1843   BILITOT 0.2* 06/03/2015 1843   GFRNONAA >60 06/04/2015 0448   GFRAA >60 06/04/2015 0448   Lipase  No results found for: LIPASE     Studies/Results: Ct Abdomen Pelvis W Contrast  06/03/2015   CLINICAL DATA:  Patient with 2 weeks of  diarrhea, lethargy and nausea/ vomiting. Low pelvic pain. Weight loss. Elevated white blood cell count. Stool coming from urethra.  EXAM: CT ABDOMEN AND PELVIS WITH CONTRAST  TECHNIQUE: Multidetector CT imaging of the abdomen and pelvis was performed using the standard protocol following bolus administration of intravenous contrast.  CONTRAST:  190mL OMNIPAQUE IOHEXOL 300 MG/ML  SOLN  COMPARISON:  Abdominal radiographs 05/28/2015  FINDINGS: Lower chest: Normal heart size. No consolidative or nodular pulmonary opacities.  Hepatobiliary: Fatty deposition adjacent to the falciform ligament. Additional adjacent sub cm low-attenuation lesion within the left hepatic lobe too small to characterize however likely represents a small cyst. Gallbladder is decompressed. No intrahepatic or extrahepatic biliary ductal dilatation.  Pancreas: Unremarkable  Spleen: Unremarkable  Adrenals/Urinary Tract: The adrenal glands are normal. Multiple parapelvic cyst within the left renal hilum. Kidneys enhance symmetrically with contrast. There is no hydronephrosis. Wall thickening of the urinary bladder which is decompressed. There is a small amount of gas anteriorly within the urinary bladder.  Stomach/Bowel: There is circumferential wall thickening and hyperenhancement of sigmoid colon and rectum. Multiple sigmoid colonic diverticula. There is an irregular rim enhancing fluid and gas containing collection along the right aspect of the sigmoid colon with one component measuring 6.5 x 2.7 cm (image  66; series 2). More cranially there is predominantly fat tissue stranding with an additional associated fluid collection measuring 2.7 x 2.7 cm along the bladder dome (image 71; series 2). There is a fistulous connection from this overlying abscess into urinary bladder best demonstrated on image 54; series 602. Within the left hemipelvis there is a 9 x 6 cm fluid collection (image 63; series 2) which appears to represent an adnexal cyst, likely  ovarian in etiology.  Vascular/Lymphatic: No retroperitoneal lymphadenopathy. Aorta is normal in caliber. Peripheral calcified atherosclerotic plaque.  Other: Small amount a gas is demonstrated non dependently within the urinary bladder. Additionally small amount of gas is demonstrated within the vagina.  Musculoskeletal: No aggressive or acute appearing osseous lesions. Lower lumbar spine degenerative changes.  IMPRESSION: The sigmoid colon rectum is markedly thick walled most compatible with severe colitis and/or diverticulitis, which has perforated. Along the right aspect of sigmoid colon there are multiple rim enhancing fluid and gas containing collections most compatible with abscesses. There is an abscess along the bladder dome with an adjacent small focus of gas within the bladder lumen and significant wall thickening of the adjacent bladder wall, compatible with colovesical fistula. 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  There is an 8 cm fluid collection within the left adnexa likely ovarian in etiology. This may represent a large ovarian cyst however superimposed infection is not excluded.  These results were called by telephone at the time of interpretation on 06/03/2015 at 3:13 pm to Dr. Cecille Rubin [facility] , who verbally acknowledged these results.   Electronically Signed   By: Lovey Newcomer M.D.   On: 06/03/2015 15:20    Anti-infectives: Anti-infectives    Start     Dose/Rate Route Frequency Ordered Stop   06/04/15 0400  piperacillin-tazobactam (ZOSYN) IVPB 3.375 g     3.375 g 12.5 mL/hr over 240 Minutes Intravenous Every 8 hours 06/03/15 2209     06/03/15 1900  piperacillin-tazobactam (ZOSYN) IVPB 4.5 g  Status:  Discontinued     4.5 g 200 mL/hr over 30 Minutes Intravenous  Once 06/03/15 1850 06/03/15 1852   06/03/15 1900  piperacillin-tazobactam (ZOSYN) IVPB 3.375 g     3.375 g 100 mL/hr over 30 Minutes Intravenous STAT 06/03/15 1853 06/03/15 2049        Assessment/Plan Severe diverticulitis or possible colitis with contained perforation and abscesses as well as colovesical fistula and possible colovaginal fistula Pneumouria/Fecaluria -NPO, IVF, pain control, antiemetics -IV antibiotics - Zosyn Day #1 -Attempt medical management at this time, if not improving may need surgical interventions -IR pending evaluation to see if they can aspirate or drain the abscess -GI Dr. Kelby Fam consulting, consider flex sig for better characterization of extent of problem after IR drain -WBC normal, afebrile    LOS: 1 day    Nat Christen 06/04/2015, 7:57 AM Pager: [phone]

## 2015-06-04 NOTE — Progress Notes (Signed)
Initial Nutrition Assessment  DOCUMENTATION CODES:   Non-severe (moderate) malnutrition in context of acute illness/injury  INTERVENTION:  - RD to continue to monitor patient.  - Recommend Carnation Instant Breakfast as supplement when diet advances due to patient preference.    NUTRITION DIAGNOSIS:   Inadequate oral intake related to acute illness, altered GI function as evidenced by NPO status, percent weight loss.   GOAL:   Patient will meet greater than or equal to 90% of their needs   MONITOR:   PO intake, Supplement acceptance, Diet advancement, Labs, Weight trends, Skin, I & O's  REASON FOR ASSESSMENT:   Malnutrition Screening Tool, NPO/Clear Liquid Diet    ASSESSMENT:   54 yr old female with perforated sigmoid colon, currently NPO. Patient started having severe diarrhea and assoaited nausea and poor appetite, blood in stools, fatigue, and lower abdonminal pain, 1 month prior. Patient has lost 15# since GI distress began. Previous medical history of arthritis, Depression, GERD, smoking, CHF, COPD, HIV, HTN, MI, Osteoportosis, Stroke, TB, hyperlipedemia,.   Patient alert in chair with guest present at time of visit. Patient visit for MST score = 4, patient currently NPO. Patient BMI is 24.8, normal.   Patient described their usual diet to include lots of salads, raw vegetables, enjoys tuna fish and occasionally eats pizza or a burger. She cooks for herself at home, and usually tries to make big meals and then eats lots of leftovers. For beverages, she drinks lots of water, half-caffeinated tea, occasionally coffee, and watered down Gatorade.   About 1 month prior gastric upset symptoms began - she had severe diarrhea, with associated poor appetite. Patient began vomiting once daily, usually in morning. Per patient chart, she also experienced fatigue, blood in stools, and lower abdominal pain.   In the 1 month since gastric symptoms began, she has lost weight. Patient  reported her weight 1 month ago close to her usual body weight, 150 lbs. This represents a significant weight change, 13%/1 month period.   She reports drinking a protein supplement at home, but does not like Boost, or Ensure supplements. She would be interested in trying a chocolate milk supplement when her diet advances or indicates that she likes carnation instant breakfast.   Nutrition focused physical exam indicated mild muscle wasting.   Medications reviewed: Zofran.   Labs reviewed: low RBC, low Hg, low Hct, high platelets, low BUN, low Ca, low Na, low K, low Cl.   Diet Order:  Diet NPO time specified Except for: Ice Chips, Sips with Meds  Skin:  Reviewed, no issues  Last BM:  06/03/2015  Height:   Ht Readings from Last 1 Encounters:  06/03/15 5' 1.5" (1.562 m)    Weight:   Wt Readings from Last 1 Encounters:  06/03/15 133 lb (60.328 kg)    Ideal Body Weight:  47.7 kg (kg)  BMI:  Body mass index is 24.73 kg/(m^2).  Estimated Nutritional Needs:   Kcal:  1300-1500 kcal  Protein:  60-70 g  Fluid:  2 L/day  EDUCATION NEEDS:   No education needs identified at this time  Kayleen Memos, BA. BS. Dietetic Intern

## 2015-06-05 ENCOUNTER — Ambulatory Visit: Payer: BLUE CROSS/BLUE SHIELD | Admitting: Medical

## 2015-06-05 ENCOUNTER — Encounter (HOSPITAL_COMMUNITY): Payer: Self-pay | Admitting: *Deleted

## 2015-06-05 ENCOUNTER — Encounter (HOSPITAL_COMMUNITY): Admission: EM | Disposition: A | Discharge status: Home Health | Payer: Self-pay | Source: Home / Self Care

## 2015-06-05 DIAGNOSIS — Z66 Do not resuscitate: Secondary | ICD-10-CM

## 2015-06-05 DIAGNOSIS — N739 Female pelvic inflammatory disease, unspecified: Secondary | ICD-10-CM | POA: Insufficient documentation

## 2015-06-05 DIAGNOSIS — Z515 Encounter for palliative care: Secondary | ICD-10-CM

## 2015-06-05 HISTORY — PX: FLEXIBLE SIGMOIDOSCOPY: SHX5431

## 2015-06-05 SURGERY — SIGMOIDOSCOPY, FLEXIBLE
Anesthesia: Moderate Sedation

## 2015-06-05 MED ORDER — DIPHENHYDRAMINE HCL 50 MG/ML IJ SOLN
INTRAMUSCULAR | Status: AC
  Filled 2015-06-05: qty 1

## 2015-06-05 MED ORDER — SODIUM CHLORIDE 0.9 % IV SOLN: INTRAVENOUS | Status: DC

## 2015-06-05 MED ORDER — FENTANYL CITRATE (PF) 100 MCG/2ML IJ SOLN
INTRAMUSCULAR | Status: AC
  Filled 2015-06-05: qty 2

## 2015-06-05 MED ORDER — MIDAZOLAM HCL 5 MG/ML IJ SOLN
INTRAMUSCULAR | Status: AC
  Filled 2015-06-05: qty 1

## 2015-06-05 MED ORDER — FENTANYL CITRATE (PF) 100 MCG/2ML IJ SOLN
INTRAMUSCULAR | Status: DC | PRN
  Administered 2015-06-05 (×2): 25 ug via INTRAVENOUS

## 2015-06-05 MED ORDER — MIDAZOLAM HCL 10 MG/2ML IJ SOLN
INTRAMUSCULAR | Status: DC | PRN
  Administered 2015-06-05: 2 mg via INTRAVENOUS
  Administered 2015-06-05: 1 mg via INTRAVENOUS

## 2015-06-05 NOTE — Consult Note (Signed)
Consultation Note Date: 06/05/2015   Patient Name: [name]  DOB: [birth year]  MRN: [id number]  Age / Sex: 54 y.o., female   PCP: Mackie Pai, PA-C Referring Physician: Nolon Nations, MD  Reason for Consultation: Clarifying GOC and  Advanced directives  Palliative Care Assessment and Plan Summary of Established Goals of Care and Medical Treatment Preferences    Palliative Care Discussion Held Today:    This NP Wadie Lessen reviewed medical records, received report from team, assessed the patient and then meet at the patient's bedside along with her mother  to discuss role of palliative care and to discuss with  patient her advanced directives.  Surgery requested palliative to clarify with patient her clearly expressed  feelings regarding her DNR status.  A copy of her living will and HPOA is now on her hard chart, to be scanned into EMR  Patient fully understands her request for DNR status, a decision she and her husband made years ago.  We discussed however that if patient agrees to surgery or certain procedures her DNR status made need to be recinded temporality and then reinstated again when procedure is completed.  Her main concern is that medical staff be aware of her wish for no CPR or intubation in the event of an arrest.  Patient encouraged to call with questions or concerns.  PMT will continue to support holistically.   Primary Decision Maker: self        Chief Complaint: diarrhea and abdominal pain  History of Present Illness:    54 y.o. female developed diarrhea about a month ago during travel [place]. She underwent an infectious workup which did not yield any etiology, and was treated with empiric antibiotics, which did not help her symptoms. She has also had some lower abdominal pain over this time. Labs showed significant leukocytosis.  CT scan showed severe inflammatory changes in the sigmoid/rectosigmoid colon, with abscess formation and fistula to the  bladder.  She was admitted and antibiotics were started. WBC significantly improved  and she reports improved pain control. She has lost about 15 lbs during this course. She denies any prior bowel symptoms prior to onset of this acute illness. Prior colonoscopy 5-6 years ago did not show any inflammatory changes, one benign polyp. She does endorse seeing stool in her urine, which is new for her.  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 Perforated sigmoid colon  Palliative Review of Systems:    -weakness, diarrhea, weight loss   I have reviewed the medical record, interviewed the patient and family, and examined the patient. The following aspects are pertinent.  Past Medical History  Diagnosis Date  . Arthritis   . Depression     pt states when her husband passed away. now feels fine.  . Melanoma     Chest    Social History   Social History  . Marital Status: Widowed    Spouse Name: N/A  . Number of Children: N/A  . Years of Education: N/A   Social History Main Topics  . Smoking status: Current Every Day Smoker -- 1.00 packs/day for 30 years  . Smokeless tobacco: Never Used     Comment: Pt info given 05-30-2015  . Alcohol Use: 0.0 oz/week    0 Standard drinks or equivalent per week     Comment: 2 glasses of wine a week.  . Drug Use:  No  . Sexual Activity: No   Other Topics Concern  . None   Social History Narrative   Family History  Problem Relation Age of Onset  . Hypertension Father    Scheduled Meds: . enoxaparin (LOVENOX) injection  40 mg Subcutaneous QHS  . pantoprazole (PROTONIX) IV  40 mg Intravenous QHS  . piperacillin-tazobactam (ZOSYN)  IV  3.375 g Intravenous Q8H   Continuous Infusions: . sodium chloride    . dextrose 5 % and 0.9 % NaCl with KCl 20 mEq/L 100 mL/hr at 06/05/15 0349   PRN Meds:.morphine injection, ondansetron **OR**  ondansetron (ZOFRAN) IV Medications Prior to Admission:  Prior to Admission medications   Medication Sig Start Date End Date Taking? Authorizing Provider  DULoxetine (CYMBALTA) 60 MG capsule Take 60 mg by mouth daily.   Yes Historical Provider, MD  Na Sulfate-K Sulfate-Mg Sulf SOLN Please take as directed for colonoscopy 05/30/15   Lori P Hvozdovic, PA-C   No Known Allergies CBC:    Component Value Date/Time   WBC 10.5 06/04/2015 0448   HGB 10.5* 06/04/2015 0448   HCT 31.5* 06/04/2015 0448   PLT 615* 06/04/2015 0448   MCV 93.5 06/04/2015 0448   NEUTROABS 13.7* 06/03/2015 1843   LYMPHSABS 2.6 06/03/2015 1843   MONOABS 1.2* 06/03/2015 1843   EOSABS 0.0 06/03/2015 1843   BASOSABS 0.0 06/03/2015 1843   Comprehensive Metabolic Panel:    Component Value Date/Time   NA 136 06/04/2015 0448   K 3.6 06/04/2015 0448   CL 101 06/04/2015 0448   CO2 29 06/04/2015 0448   BUN <5* 06/04/2015 0448   CREATININE 0.49 06/04/2015 0448   GLUCOSE 120* 06/04/2015 0448   CALCIUM 8.8* 06/04/2015 0448   AST 24 06/03/2015 1843   ALT 11* 06/03/2015 1843   ALKPHOS 52 06/03/2015 1843   BILITOT 0.2* 06/03/2015 1843   PROT 6.7 06/03/2015 1843   ALBUMIN 3.1* 06/03/2015 1843    Physical Exam:  Vital Signs: BP 151/82 mmHg  Pulse 87  Temp(Src) 97.8 F (36.6 C) (Oral)  Resp 18  Ht 5\' 1"  (1.549 m)  Wt 60.328 kg (133 lb)  BMI 25.14 kg/m2  SpO2 98% SpO2: SpO2: 98 % O2 Device: O2 Device: Not Delivered O2 Flow Rate: O2 Flow Rate (L/min): 2 L/min Intake/output summary:  Intake/Output Summary (Last 24 hours) at 06/05/15 1135 Last data filed at 06/05/15 1026  Gross per 24 hour  Intake   2455 ml  Output   2210 ml  Net    245 ml   LBM: Last BM Date: 06/03/15 Baseline Weight: Weight: 60.328 kg (133 lb) Most recent weight: Weight: 60.328 kg (133 lb)  Exam Findings:   General: alert and oriented, NAD HEENT: moist buccal membranes, no exudate CVS: RRR Resp: CTA Abd: mild distention, tenderness,  noted drain Skin: warm and dry  Additional Data Reviewed: Recent Labs     06/03/15  1843  06/04/15  0448  WBC  17.5*  10.5  HGB  12.4  10.5*  PLT  579*  615*  NA  128*  136  BUN  <5*  <5*  CREATININE  0.53  0.49     Time In: 1045 Time Out: 1145 Time Total: 60 min  Greater than 50%  of this time was spent counseling and coordinating care related to the above assessment and plan.  Discussed with  Jomarie Longs PA-C  Signed by: Wadie Lessen, NP  Knox Royalty, NP  06/05/2015, 11:35 AM  Please contact Palliative  Medicine Team phone at [phone] for questions and concerns.   See AMION for contact information

## 2015-06-05 NOTE — Interval H&P Note (Signed)
History and Physical Interval Note:  06/05/2015 8:57 AM  [name]  has presented today for surgery, with the diagnosis of rule out Crohns  The various methods of treatment have been discussed with the patient and family. After consideration of risks, benefits and other options for treatment, the patient has consented to  Procedure(s): FLEXIBLE SIGMOIDOSCOPY (N/A) as a surgical intervention .  The patient's history has been reviewed, patient examined, no change in status, stable for surgery.  I have reviewed the patient's chart and labs.  Questions were answered to the patient's satisfaction.     Renelda Loma Armbruster

## 2015-06-05 NOTE — Progress Notes (Signed)
[facility] Progress Note  Day of Surgery  Subjective: Pt's pain significantly improved.  No N/V.  Ambulating some.  Feels much better after drain was placed.  Urinating through foley, mostly clear.  Having flatus, no BM.  Denies purulent drainage or air from vagina.  Objective: Vital signs in last 24 hours: Temp:  [97.6 F (36.4 C)-98.5 F (36.9 C)] 97.7 F (36.5 C) (09/15 0826) Pulse Rate:  [84-106] 84 (09/15 0826) Resp:  [12-22] 21 (09/15 0900) BP: (123-172)/(68-96) 172/85 mmHg (09/15 0900) SpO2:  [95 %-100 %] 99 % (09/15 0900) Weight:  [60.328 kg (133 lb)] 60.328 kg (133 lb) (09/15 0826) Last BM Date: 06/03/15  Intake/Output from previous day: 09/14 0701 - 09/15 0700 In: 2455 [I.V.:2400; IV Piggyback:50] Out: 2360 [Urine:2350; Drains:10] Intake/Output this shift:    PE: Gen:  Alert, NAD, pleasant Card:  RRR, no M/G/R heard Pulm:  CTA, no W/R/R Abd: Soft, mild tenderness in suprapubic, both lower quadrants, +BS, no HSM, drain with sanguinous purulent drainage   Lab Results:   Recent Labs  06/03/15 1843 06/04/15 0448  WBC 17.5* 10.5  HGB 12.4 10.5*  HCT 36.8 31.5*  PLT 579* 615*   BMET  Recent Labs  06/03/15 1843 06/04/15 0448  NA 128* 136  K 3.0* 3.6  CL 91* 101  CO2 27 29  GLUCOSE 105* 120*  BUN <5* <5*  CREATININE 0.53 0.49  CALCIUM 9.2 8.8*   PT/INR  Recent Labs  06/04/15 1211  LABPROT 14.3  INR 1.09   CMP     Component Value Date/Time   NA 136 06/04/2015 0448   K 3.6 06/04/2015 0448   CL 101 06/04/2015 0448   CO2 29 06/04/2015 0448   GLUCOSE 120* 06/04/2015 0448   BUN <5* 06/04/2015 0448   CREATININE 0.49 06/04/2015 0448   CALCIUM 8.8* 06/04/2015 0448   PROT 6.7 06/03/2015 1843   ALBUMIN 3.1* 06/03/2015 1843   AST 24 06/03/2015 1843   ALT 11* 06/03/2015 1843   ALKPHOS 52 06/03/2015 1843   BILITOT 0.2* 06/03/2015 1843   GFRNONAA >60 06/04/2015 0448   GFRAA >60 06/04/2015 0448   Lipase  No results found for:  LIPASE     Studies/Results: Ct Abdomen Pelvis W Contrast  06/03/2015   CLINICAL DATA:  Patient with 2 weeks of diarrhea, lethargy and nausea/ vomiting. Low pelvic pain. Weight loss. Elevated white blood cell count. Stool coming from urethra.  EXAM: CT ABDOMEN AND PELVIS WITH CONTRAST  TECHNIQUE: Multidetector CT imaging of the abdomen and pelvis was performed using the standard protocol following bolus administration of intravenous contrast.  CONTRAST:  115mL OMNIPAQUE IOHEXOL 300 MG/ML  SOLN  COMPARISON:  Abdominal radiographs 05/28/2015  FINDINGS: Lower chest: Normal heart size. No consolidative or nodular pulmonary opacities.  Hepatobiliary: Fatty deposition adjacent to the falciform ligament. Additional adjacent sub cm low-attenuation lesion within the left hepatic lobe too small to characterize however likely represents a small cyst. Gallbladder is decompressed. No intrahepatic or extrahepatic biliary ductal dilatation.  Pancreas: Unremarkable  Spleen: Unremarkable  Adrenals/Urinary Tract: The adrenal glands are normal. Multiple parapelvic cyst within the left renal hilum. Kidneys enhance symmetrically with contrast. There is no hydronephrosis. Wall thickening of the urinary bladder which is decompressed. There is a small amount of gas anteriorly within the urinary bladder.  Stomach/Bowel: There is circumferential wall thickening and hyperenhancement of sigmoid colon and rectum. Multiple sigmoid colonic diverticula. There is an irregular rim enhancing fluid and gas containing collection along the  right aspect of the sigmoid colon with one component measuring 6.5 x 2.7 cm (image 66; series 2). More cranially there is predominantly fat tissue stranding with an additional associated fluid collection measuring 2.7 x 2.7 cm along the bladder dome (image 71; series 2). There is a fistulous connection from this overlying abscess into urinary bladder best demonstrated on image 54; series 602. Within the left  hemipelvis there is a 9 x 6 cm fluid collection (image 63; series 2) which appears to represent an adnexal cyst, likely ovarian in etiology.  Vascular/Lymphatic: No retroperitoneal lymphadenopathy. Aorta is normal in caliber. Peripheral calcified atherosclerotic plaque.  Other: Small amount a gas is demonstrated non dependently within the urinary bladder. Additionally small amount of gas is demonstrated within the vagina.  Musculoskeletal: No aggressive or acute appearing osseous lesions. Lower lumbar spine degenerative changes.  IMPRESSION: The sigmoid colon rectum is markedly thick walled most compatible with severe colitis and/or diverticulitis, which has perforated. Along the right aspect of sigmoid colon there are multiple rim enhancing fluid and gas containing collections most compatible with abscesses. There is an abscess along the bladder dome with an adjacent small focus of gas within the bladder lumen and significant wall thickening of the adjacent bladder wall, compatible with colovesical fistula. 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  There is an 8 cm fluid collection within the left adnexa likely ovarian in etiology. This may represent a large ovarian cyst however superimposed infection is not excluded.  These results were called by telephone at the time of interpretation on 06/03/2015 at 3:13 pm to Dr. Cecille Rubin [facility] , who verbally acknowledged these results.   Electronically Signed   By: Lovey Newcomer M.D.   On: 06/03/2015 15:20   Ct Image Guided Drainage By Percutaneous Catheter  06/04/2015   CLINICAL DATA:  54 year old female with nonspecific colitis and colovesicular and colovaginal fistulous. Additionally, there is relatively small volume abscess in the perirectal space and superior to the bladder.  EXAM: CT IMAGE GUIDED DRAINAGE BY PERCUTANEOUS CATHETER  Date: 06/04/2015  PROCEDURE: 1. CT-guided drain placement Interventional Radiologist:  Criselda Peaches, MD  ANESTHESIA/SEDATION: Moderate (conscious) sedation was used. 2.5 mg Versed, 75 mcg Fentanyl were administered intravenously. The patient's vital signs were monitored continuously by radiology nursing throughout the procedure.  Sedation Time: 16 minutes  MEDICATIONS: None additional  TECHNIQUE: Informed consent was obtained from the patient following explanation of the procedure, risks, benefits and alternatives. The patient understands, agrees and consents for the procedure. All questions were addressed. A time out was performed.  An initial limited barium enema was performed. The patient was then placed prone and initial axial CT imaging performed. Contrast material opacifies the redundant rectosigmoid colon. There is no focal fluid or gas collection in the perirectal space. The previously noted collections are apparently mobile and have migrated more anteriorly. There is no safe window for access from a trans gluteal approach. The large cystic structure emanating from the left ovary is again noted. Contrast material is noted collecting anterior to the bladder consistent with extravasation of the administered contrast agent and colovesicular fistula.  Therefore, the patient was maneuvered into the supine position. Axial CT imaging was performed. A small fluid and gas collection is noted superior and right lateral to the bladder dome. This is consistent with the findings on the recent CT scan. An appropriate skin entry site was selected and marked. The region was sterilely prepped and draped in standard fashion  using Betadine skin prep. Local anesthesia was attained by infiltration with 1% lidocaine. A small dermatotomy was made. Under intermittent CT fluoroscopic guidance, an 18 gauge trocar needle was advanced through the right rectus abdominis muscle and into the fluid and gas collection. An Amplatz wire was coiled within the collection. This tract was then dilated to 10 [place] and a Cook 10.2  [place] all-purpose drainage catheter advanced over the wire and formed with the locking loop centered in the small fluid and gas collection. Aspiration yields approximately 7 mL thick purulent material. This was sent for culture.  The drainage catheter was secured to the skin with 0 Prolene suture. A sterile bandage was applied. The patient tolerated the procedure well.  COMPLICATIONS: None  Estimated blood loss:  0  IMPRESSION: 1. There is no safe trans gluteal approach for abscess drainage. 2. Successful placement of an anterior approach drainage catheter into the abscess collection superior to the dome of the bladder in the presumed location of the colovesicular fistula. Aspiration yielded approximately 7 mL thick purulent material which was sent for culture.  PLAN: 1. Maintain drain to JP bulb suction for at least the first 48-72 hours. After that time, conversion to gravity bag drainage can be considered. 2. Given suspected colovesicular fistula, recommend drain injection under fluoroscopy prior to consideration of drain removal. It is likely that this drain will need to be maintained in place for a prolonged period of time.  Signed,  Criselda Peaches, MD  [facility]  [facility]   Electronically Signed   By: Jacqulynn Cadet M.D.   On: 06/04/2015 17:10    Anti-infectives: Anti-infectives    Start     Dose/Rate Route Frequency Ordered Stop   06/04/15 0400  piperacillin-tazobactam (ZOSYN) IVPB 3.375 g     3.375 g 12.5 mL/hr over 240 Minutes Intravenous Every 8 hours 06/03/15 2209     06/03/15 1900  piperacillin-tazobactam (ZOSYN) IVPB 4.5 g  Status:  Discontinued     4.5 g 200 mL/hr over 30 Minutes Intravenous  Once 06/03/15 1850 06/03/15 1852   06/03/15 1900  piperacillin-tazobactam (ZOSYN) IVPB 3.375 g     3.375 g 100 mL/hr over 30 Minutes Intravenous STAT 06/03/15 1853 06/03/15 2049       Assessment/Plan Severe diverticulitis or possible  colitis with contained perforation and abscesses as well as 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 Kelby Fam scheduled flex sig for for today -WBC normal, afebrile, recheck labs tomorrow -Colonoscopy was unsuccessful due to swelling, Dr. Havery Moros will consider repeat once inflammation has time to reduce    LOS: 2 days    Nat Christen 06/05/2015, 9:07 AM Pager: [phone]

## 2015-06-05 NOTE — H&P (View-Only) (Signed)
Consultation  Referring Provider:    Excell Seltzer, MD  Primary Care Physician:  Mackie Pai, PA-C Primary Gastroenterologist:    Zenovia Jarred, MD     Reason for Consultation:     Sigmoid perforation / abscess, diarrhea         HPI:   [name] is a 54 y.o. 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 She reports she developed diarrhea about a month ago during travel [place]. She underwent an infectious workup which did not yield any etiology, and was treated with empiric antibiotics, which did not help her symptoms. She has also had some lower abdominal pain over this time. Labs showed significant leukocytosis. 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 She was admitted last night and antibiotics were started. WBC significantly improved today and she reports improved pain control. She has lost about 15 lbs during this course. She denies any prior bowel symptoms prior to onset of this acute illness. Prior colonoscopy 5-6 years ago did not show any inflammatory changes, one benign polyp. She does endorse seeing stool in her urine, which is new for her. She was evaluated by general surgery last night. No FH of CRC or IBD. She is an active smoker. She has taken mobic as an outpatient.   Past Medical History  Diagnosis Date  . Arthritis   . Depression     pt states when her husband passed away. now feels fine.  . Melanoma     Chest     Past Surgical History  Procedure Laterality Date  . Abdominal hysterectomy    . Tonsillectomy    . Back surgery    . Melanoma excision      Family History  Problem Relation Age of Onset  . Hypertension Father    No IBD or CRC  Social History  Substance Use Topics  . Smoking status: Current Every Day Smoker -- 1.00 packs/day for 30 years  . Smokeless  tobacco: Never Used     Comment: Pt info given 05-30-2015  . Alcohol Use: 0.0 oz/week    0 Standard drinks or equivalent per week     Comment: 2 glasses of wine a week.    Prior to Admission medications   Medication Sig Start Date End Date Taking? Authorizing Provider  DULoxetine (CYMBALTA) 60 MG capsule Take 60 mg by mouth daily.   Yes Historical Provider, MD  Na Sulfate-K Sulfate-Mg Sulf SOLN Please take as directed for colonoscopy 05/30/15   Lori P Hvozdovic, PA-C    Current Facility-Administered Medications  Medication Dose Route Frequency Provider Last Rate Last Dose  . dextrose 5 % and 0.9 % NaCl with KCl 20 mEq/L infusion   Intravenous Continuous Excell Seltzer, MD 100 mL/hr at 06/03/15 2309    . DULoxetine (CYMBALTA) DR capsule 60 mg  60 mg Oral Daily Excell Seltzer, MD      . enoxaparin (LOVENOX) injection 40 mg  40 mg Subcutaneous QHS Excell Seltzer, MD   40 mg at 06/03/15 2346  . morphine 2 MG/ML injection 2-6 mg  2-6 mg Intravenous Q2H PRN Excell Seltzer, MD      . ondansetron (ZOFRAN-ODT) disintegrating tablet 4 mg  4 mg Oral Q6H PRN Excell Seltzer, MD       Or  . ondansetron [facility]) injection 4 mg  4  mg Intravenous Q6H PRN Excell Seltzer, MD      . pantoprazole (PROTONIX) injection 40 mg  40 mg Intravenous QHS Excell Seltzer, MD   40 mg at 06/03/15 2346  . piperacillin-tazobactam (ZOSYN) IVPB 3.375 g  3.375 g Intravenous Q8H Excell Seltzer, MD   3.375 g at 06/04/15 0441    Allergies as of 06/03/2015  . (No Known Allergies)     Review of Systems:    As per HPI, otherwise negative    Physical Exam:  Vital signs in last 24 hours: Temp:  [97.9 F (36.6 C)-98.4 F (36.9 C)] 98.3 F (36.8 C) (09/14 0552) Pulse Rate:  [98-144] 98 (09/14 0552) Resp:  [18-19] 18 (09/14 0552) BP: (109-140)/(70-94) 130/94 mmHg (09/14 0552) SpO2:  [96 %-98 %] 96 % (09/14 0552) Weight:  [133 lb (60.328 kg)] 133 lb (60.328 kg) (09/13 2218) Last BM Date:  06/03/15 General:   Pleasant white femaile in NAD Head:  Normocephalic and atraumatic. Eyes:   No icterus.   Conjunctiva pink. Ears:  Normal auditory acuity. Neck:  Supple Lungs:  Respirations even and unlabored. Lungs clear to auscultation bilaterally.   No wheezes, crackles, or rhonchi.  Heart:  Regular rate and rhythm; no MRG Abdomen:  Soft, nondistended, lower abdomen TTP without peritoneal signs. Normal bowel sounds. No appreciable masses or hepatomegaly.  Rectal:  Not performed.  Msk:  Symmetrical without gross deformities.  Extremities:  Without edema. Neurologic:  Alert and  oriented x4;  grossly normal neurologically. Skin:  Intact without significant lesions or rashes. Psych:  Alert and cooperative. Normal affect.  LAB RESULTS:  Recent Labs  06/03/15 1843 06/04/15 0448  WBC 17.5* 10.5  HGB 12.4 10.5*  HCT 36.8 31.5*  PLT 579* 615*   BMET  Recent Labs  06/03/15 1843 06/04/15 0448  NA 128* 136  K 3.0* 3.6  CL 91* 101  CO2 27 29  GLUCOSE 105* 120*  BUN <5* <5*  CREATININE 0.53 0.49  CALCIUM 9.2 8.8*   LFT  Recent Labs  06/03/15 1843  PROT 6.7  ALBUMIN 3.1*  AST 24  ALT 11*  ALKPHOS 52  BILITOT 0.2*   PT/INR No results for input(s): LABPROT, INR in the last 72 hours.  STUDIES: Ct Abdomen Pelvis W Contrast  06/03/2015   CLINICAL DATA:  Patient with 2 weeks of diarrhea, lethargy and nausea/ vomiting. Low pelvic pain. Weight loss. Elevated white blood cell count. Stool coming from urethra.  EXAM: CT ABDOMEN AND PELVIS WITH CONTRAST  TECHNIQUE: Multidetector CT imaging of the abdomen and pelvis was performed using the standard protocol following bolus administration of intravenous contrast.  CONTRAST:  167mL OMNIPAQUE IOHEXOL 300 MG/ML  SOLN  COMPARISON:  Abdominal radiographs 05/28/2015  FINDINGS: Lower chest: Normal heart size. No consolidative or nodular pulmonary opacities.  Hepatobiliary: Fatty deposition adjacent to the falciform ligament. Additional  adjacent sub cm low-attenuation lesion within the left hepatic lobe too small to characterize however likely represents a small cyst. Gallbladder is decompressed. No intrahepatic or extrahepatic biliary ductal dilatation.  Pancreas: Unremarkable  Spleen: Unremarkable  Adrenals/Urinary Tract: The adrenal glands are normal. Multiple parapelvic cyst within the left renal hilum. Kidneys enhance symmetrically with contrast. There is no hydronephrosis. Wall thickening of the urinary bladder which is decompressed. There is a small amount of gas anteriorly within the urinary bladder.  Stomach/Bowel: There is circumferential wall thickening and hyperenhancement of sigmoid colon and rectum. Multiple sigmoid colonic diverticula. There is an irregular rim enhancing fluid and gas  containing collection along the right aspect of the sigmoid colon with one component measuring 6.5 x 2.7 cm (image 66; series 2). More cranially there is predominantly fat tissue stranding with an additional associated fluid collection measuring 2.7 x 2.7 cm along the bladder dome (image 71; series 2). There is a fistulous connection from this overlying abscess into urinary bladder best demonstrated on image 54; series 602. Within the left hemipelvis there is a 9 x 6 cm fluid collection (image 63; series 2) which appears to represent an adnexal cyst, likely ovarian in etiology.  Vascular/Lymphatic: No retroperitoneal lymphadenopathy. Aorta is normal in caliber. Peripheral calcified atherosclerotic plaque.  Other: Small amount a gas is demonstrated non dependently within the urinary bladder. Additionally small amount of gas is demonstrated within the vagina.  Musculoskeletal: No aggressive or acute appearing osseous lesions. Lower lumbar spine degenerative changes.  IMPRESSION: The sigmoid colon rectum is markedly thick walled most compatible with severe colitis and/or diverticulitis, which has perforated. Along the right aspect of sigmoid colon there  are multiple rim enhancing fluid and gas containing collections most compatible with abscesses. There is an abscess along the bladder dome with an adjacent small focus of gas within the bladder lumen and significant wall thickening of the adjacent bladder wall, compatible with colovesical fistula. 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  There is an 8 cm fluid collection within the left adnexa likely ovarian in etiology. This may represent a large ovarian cyst however superimposed infection is not excluded.  These results were called by telephone at the time of interpretation on 06/03/2015 at 3:13 pm to Dr. Cecille Rubin [facility] , who verbally acknowledged these results.   Electronically Signed   By: Lovey Newcomer M.D.   On: 06/03/2015 15:20     PREVIOUS ENDOSCOPIES:            Colonoscopy 5-6 years ago, polyp removed, no inflammatory changes   Impression / Plan:  54 y/o female, previously healthy without bowel symptoms, presenting with one month history of severe diarrhea, abdominal pain, and new leukocytosis, found to have severe distal colon inflammation with abscess formation and colovesical / colovaginal fistula. She has responded well to IV antibiotics overnight with significant downtrend in her leukocytosis.   Unclear if this presentation is due to diverticulitis (findings on CT in approximation to diverticulosis) or underlying IBD (would favor Crohn's in this setting). Ultimately endoscopy will help clarify the etiology and assess for IBD. Agree with IR guided abscess drainage first and continuation for antibiotics. We can perform endoscopy with biopsies after she has been drained and cooled off a bit, perhaps tomorrow or Friday, which can also help determine timing of surgery. If this ultimately does turn out to be IBD, regarding her options for medical therapy, given the severity of her perforating disease with fistula formation, she would warrant aggressive  immunosuppression up front with anti-TNF therapy, likely in conjunction with immunomodulator, once her infection is controlled. Will await her course and follow with you, standing by for endoscopy in the near future and discuss surgical plans.  Please call with questions / concerns, or changes in her status. Thanks  Valentine Cellar, MD [facility] Pager [phone]    LOS: 1 day   [name]  06/04/2015, 8:06 AM

## 2015-06-05 NOTE — Progress Notes (Signed)
Referring Physician(s): CCS  Chief Complaint: Abscess S/p 81F drain into the anterior abscess at the bladder dome. Aspiration yields 10 mL thick pus on 06/04/15  Subjective: Patient without complaints, denies pain at drain site.   Allergies: Review of patient's allergies indicates no known allergies.  Medications: Prior to Admission medications   Medication Sig Start Date End Date Taking? Authorizing Provider  DULoxetine (CYMBALTA) 60 MG capsule Take 60 mg by mouth daily.   Yes Historical Provider, MD  Na Sulfate-K Sulfate-Mg Sulf SOLN Please take as directed for colonoscopy 05/30/15   Lori P Hvozdovic, PA-C    Vital Signs: BP 152/83 mmHg  Pulse 87  Temp(Src) 98.2 F (36.8 C) (Oral)  Resp 18  Ht 5\' 1"  (1.549 m)  Wt 133 lb (60.328 kg)  BMI 25.14 kg/m2  SpO2 99%  Physical Exam General: A&ox3, NAD Abd: Soft, NT, lower pelvic drain intact, NT, 30 cc/24 hrs, 5 cc in JP bulb purulent, Cx no growth/pending   Imaging: Ct Abdomen Pelvis W Contrast  06/03/2015   CLINICAL DATA:  Patient with 2 weeks of diarrhea, lethargy and nausea/ vomiting. Low pelvic pain. Weight loss. Elevated white blood cell count. Stool coming from urethra.  EXAM: CT ABDOMEN AND PELVIS WITH CONTRAST  TECHNIQUE: Multidetector CT imaging of the abdomen and pelvis was performed using the standard protocol following bolus administration of intravenous contrast.  CONTRAST:  143mL OMNIPAQUE IOHEXOL 300 MG/ML  SOLN  COMPARISON:  Abdominal radiographs 05/28/2015  FINDINGS: Lower chest: Normal heart size. No consolidative or nodular pulmonary opacities.  Hepatobiliary: Fatty deposition adjacent to the falciform ligament. Additional adjacent sub cm low-attenuation lesion within the left hepatic lobe too small to characterize however likely represents a small cyst. Gallbladder is decompressed. No intrahepatic or extrahepatic biliary ductal dilatation.  Pancreas: Unremarkable  Spleen: Unremarkable  Adrenals/Urinary Tract: The  adrenal glands are normal. Multiple parapelvic cyst within the left renal hilum. Kidneys enhance symmetrically with contrast. There is no hydronephrosis. Wall thickening of the urinary bladder which is decompressed. There is a small amount of gas anteriorly within the urinary bladder.  Stomach/Bowel: There is circumferential wall thickening and hyperenhancement of sigmoid colon and rectum. Multiple sigmoid colonic diverticula. There is an irregular rim enhancing fluid and gas containing collection along the right aspect of the sigmoid colon with one component measuring 6.5 x 2.7 cm (image 66; series 2). More cranially there is predominantly fat tissue stranding with an additional associated fluid collection measuring 2.7 x 2.7 cm along the bladder dome (image 71; series 2). There is a fistulous connection from this overlying abscess into urinary bladder best demonstrated on image 54; series 602. Within the left hemipelvis there is a 9 x 6 cm fluid collection (image 63; series 2) which appears to represent an adnexal cyst, likely ovarian in etiology.  Vascular/Lymphatic: No retroperitoneal lymphadenopathy. Aorta is normal in caliber. Peripheral calcified atherosclerotic plaque.  Other: Small amount a gas is demonstrated non dependently within the urinary bladder. Additionally small amount of gas is demonstrated within the vagina.  Musculoskeletal: No aggressive or acute appearing osseous lesions. Lower lumbar spine degenerative changes.  IMPRESSION: The sigmoid colon rectum is markedly thick walled most compatible with severe colitis and/or diverticulitis, which has perforated. Along the right aspect of sigmoid colon there are multiple rim enhancing fluid and gas containing collections most compatible with abscesses. There is an abscess along the bladder dome with an adjacent small focus of gas within the bladder lumen and significant wall thickening of the  adjacent bladder wall, compatible with colovesical fistula.  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  There is an 8 cm fluid collection within the left adnexa likely ovarian in etiology. This may represent a large ovarian cyst however superimposed infection is not excluded.  These results were called by telephone at the time of interpretation on 06/03/2015 at 3:13 pm to Dr. Cecille Rubin [facility] , who verbally acknowledged these results.   Electronically Signed   By: Lovey Newcomer M.D.   On: 06/03/2015 15:20   Ct Image Guided Drainage By Percutaneous Catheter  06/04/2015   CLINICAL DATA:  54 year old female with nonspecific colitis and colovesicular and colovaginal fistulous. Additionally, there is relatively small volume abscess in the perirectal space and superior to the bladder.  EXAM: CT IMAGE GUIDED DRAINAGE BY PERCUTANEOUS CATHETER  Date: 06/04/2015  PROCEDURE: 1. CT-guided drain placement Interventional Radiologist:  Criselda Peaches, MD  ANESTHESIA/SEDATION: Moderate (conscious) sedation was used. 2.5 mg Versed, 75 mcg Fentanyl were administered intravenously. The patient's vital signs were monitored continuously by radiology nursing throughout the procedure.  Sedation Time: 16 minutes  MEDICATIONS: None additional  TECHNIQUE: Informed consent was obtained from the patient following explanation of the procedure, risks, benefits and alternatives. The patient understands, agrees and consents for the procedure. All questions were addressed. A time out was performed.  An initial limited barium enema was performed. The patient was then placed prone and initial axial CT imaging performed. Contrast material opacifies the redundant rectosigmoid colon. There is no focal fluid or gas collection in the perirectal space. The previously noted collections are apparently mobile and have migrated more anteriorly. There is no safe window for access from a trans gluteal approach. The large cystic structure emanating from the left ovary is again  noted. Contrast material is noted collecting anterior to the bladder consistent with extravasation of the administered contrast agent and colovesicular fistula.  Therefore, the patient was maneuvered into the supine position. Axial CT imaging was performed. A small fluid and gas collection is noted superior and right lateral to the bladder dome. This is consistent with the findings on the recent CT scan. An appropriate skin entry site was selected and marked. The region was sterilely prepped and draped in standard fashion using Betadine skin prep. Local anesthesia was attained by infiltration with 1% lidocaine. A small dermatotomy was made. Under intermittent CT fluoroscopic guidance, an 18 gauge trocar needle was advanced through the right rectus abdominis muscle and into the fluid and gas collection. An Amplatz wire was coiled within the collection. This tract was then dilated to 10 [place] and a Cook 10.2 [place] all-purpose drainage catheter advanced over the wire and formed with the locking loop centered in the small fluid and gas collection. Aspiration yields approximately 7 mL thick purulent material. This was sent for culture.  The drainage catheter was secured to the skin with 0 Prolene suture. A sterile bandage was applied. The patient tolerated the procedure well.  COMPLICATIONS: None  Estimated blood loss:  0  IMPRESSION: 1. There is no safe trans gluteal approach for abscess drainage. 2. Successful placement of an anterior approach drainage catheter into the abscess collection superior to the dome of the bladder in the presumed location of the colovesicular fistula. Aspiration yielded approximately 7 mL thick purulent material which was sent for culture.  PLAN: 1. Maintain drain to JP bulb suction for at least the first 48-72 hours. After that time, conversion to gravity bag drainage can  be considered. 2. Given suspected colovesicular fistula, recommend drain injection under fluoroscopy prior to  consideration of drain removal. It is likely that this drain will need to be maintained in place for a prolonged period of time.  Signed,  Criselda Peaches, MD  [facility]  [facility]   Electronically Signed   By: Jacqulynn Cadet M.D.   On: 06/04/2015 17:10    Labs:  CBC:  Recent Labs  05/28/15 0948 05/30/15 1614 06/03/15 1843 06/04/15 0448  WBC 18.3 Repeated and verified X2.* 21.2 Repeated and verified X2.* 17.5* 10.5  HGB 12.5 12.7 12.4 10.5*  HCT 38.0 38.1 36.8 31.5*  PLT 954.0* 949.0* 579* 615*    COAGS:  Recent Labs  06/04/15 1211  INR 1.09    BMP:  Recent Labs  05/28/15 0948 05/30/15 1614 06/03/15 1843 06/04/15 0448  NA 128* 128* 128* 136  K 4.3 3.9 3.0* 3.6  CL 90* 88* 91* 101  CO2 28 29 27 29   GLUCOSE 109* 101* 105* 120*  BUN 7 6 <5* <5*  CALCIUM 9.6 9.9 9.2 8.8*  CREATININE 0.48 0.41 0.53 0.49  GFRNONAA  --   --  >60 >60  GFRAA  --   --  >60 >60    LIVER FUNCTION TESTS:  Recent Labs  05/23/15 0824 05/28/15 0948 05/30/15 1614 06/03/15 1843  BILITOT 0.5 0.4 0.4 0.2*  AST 11 10 9 24   ALT 10 7 6  11*  ALKPHOS 81 64 63 52  PROT 7.1 6.8 7.3 6.7  ALBUMIN 3.3* 3.1* 3.3* 3.1*    Assessment and Plan: Severe diverticulitis or possible colitis with contained perforation and abscesses as well as colovesical fistula and possible colovaginal fistula S/p 12F drain into the anterior abscess at the bladder dome. Aspiration yields 10 mL thick pus 06/04/15 Continue to monitor daily output and flush drain.  Plans per CCS  Maintain drain to JP bulb suction for at least the first 48-72 hours. After that time, conversion to gravity bag drainage can be considered.  Given suspected colovesicular fistula, recommend drain injection under fluoroscopy prior to consideration of drain removal. It is likely that this drain will need to be maintained in place for a prolonged period of time.   SignedHedy Jacob 06/05/2015, 3:07 PM   I spent a total of 15 Minutes at the the patient's bedside AND on the patient's hospital floor or unit, greater than 50% of which was counseling/coordinating care for abscess

## 2015-06-05 NOTE — Op Note (Signed)
[facility] [place], [id number]   FLEXIBLE SIGMOIDOSCOPY PROCEDURE REPORT  PATIENT: [name], [name]  MR#: [id number] BIRTHDATE: [birth year] , 48  yrs. old GENDER: female ENDOSCOPIST: [place] Cellar, MD REFERRED BY: PROCEDURE DATE:  06/05/2015 PROCEDURE:   Sigmoidoscopy, diagnostic ASA CLASS:   Class III INDICATIONS:an abnormal CT. MEDICATIONS: Midazolam (Versed) 3 mg IV and Fentanyl 50 mcg IV  DESCRIPTION OF PROCEDURE:   After the risks benefits and alternatives of the procedure were thoroughly explained, informed consent was obtained.  Digital exam revealed no abnormalities of the rectum. The [id number]  endoscope was introduced through the anus and advanced to the rectum , The exam was Without limitations. The quality of the prep was The overall prep quality was adequate. . Estimated blood loss is zero unless otherwise noted in this procedure report. The instrument was then slowly withdrawn as the mucosa was fully examined.         COLON FINDINGS: The mucosa of the rectum was normal.  In the rectosigmoid colon there was edema and restricted mobility of the colon.  Using an upper endoscope, it was not able to traverse into the sigmoid colon due to these issues.  Given the CT findings, I did not want to push through the restricted area and the procedure was aborted after discussion with general surgery.  Retroflexion not performed.  Of the visualized mucosa, no inflammatory changes were appreciated.          The scope was then withdrawn from the patient and the procedure terminated.  COMPLICATIONS: There were no immediate complications.  ENDOSCOPIC IMPRESSION: Normal rectum. Significant edema and restricted mobility of the rectosigmoid and distal sigmoid colon, unable to traverse the area safely with upper endoscope.  RECOMMENDATIONS: Return to the ward Resume medications/antibiotics GI service will continue to follow. Allow more time  for inflammation to settle down and consider another attempt in the upcoming days. Will discuss with surgery.    eSigned:  [place] Cellar, MD 06/05/2015 9:32 AM   CC:

## 2015-06-06 ENCOUNTER — Encounter (HOSPITAL_COMMUNITY): Payer: Self-pay | Admitting: Gastroenterology

## 2015-06-06 DIAGNOSIS — N321 Vesicointestinal fistula: Secondary | ICD-10-CM | POA: Insufficient documentation

## 2015-06-06 DIAGNOSIS — Z66 Do not resuscitate: Secondary | ICD-10-CM

## 2015-06-06 DIAGNOSIS — N824 Other female intestinal-genital tract fistulae: Secondary | ICD-10-CM | POA: Insufficient documentation

## 2015-06-06 DIAGNOSIS — Z515 Encounter for palliative care: Secondary | ICD-10-CM

## 2015-06-06 LAB — CBC
HCT: 34.4 % — ABNORMAL LOW (ref 36.0–46.0)
HEMOGLOBIN: 11.4 g/dL — AB (ref 12.0–15.0)
MCH: 31 pg (ref 26.0–34.0)
MCHC: 33.1 g/dL (ref 30.0–36.0)
MCV: 93.5 fL (ref 78.0–100.0)
PLATELETS: 560 10*3/uL — AB (ref 150–400)
RBC: 3.68 MIL/uL — AB (ref 3.87–5.11)
RDW: 13.5 % (ref 11.5–15.5)
WBC: 10.7 10*3/uL — AB (ref 4.0–10.5)

## 2015-06-06 LAB — BASIC METABOLIC PANEL
ANION GAP: 6 (ref 5–15)
CHLORIDE: 109 mmol/L (ref 101–111)
CO2: 26 mmol/L (ref 22–32)
Calcium: 8.8 mg/dL — ABNORMAL LOW (ref 8.9–10.3)
Creatinine, Ser: 0.51 mg/dL (ref 0.44–1.00)
GFR calc Af Amer: >60 mL/min (ref >60)
GLUCOSE: 131 mg/dL — AB (ref 65–99)
POTASSIUM: 3.8 mmol/L (ref 3.5–5.1)
Sodium: 141 mmol/L (ref 135–145)

## 2015-06-06 LAB — STOOL CULTURE

## 2015-06-06 NOTE — Progress Notes (Signed)
Patient ID: [name], female   DOB: Dec 11, 1960, 54 y.o.   MRN: [id number]    Referring Physician(s): CCS  Chief Complaint:  Pelvic abscesses  Subjective:  Pt feeling better; denies sig abd pain,N/V; hungry  Allergies: Review of patient's allergies indicates no known allergies.  Medications: Prior to Admission medications   Medication Sig Start Date End Date Taking? Authorizing Provider  DULoxetine (CYMBALTA) 60 MG capsule Take 60 mg by mouth daily.   Yes Historical Provider, MD  Na Sulfate-K Sulfate-Mg Sulf SOLN Please take as directed for colonoscopy 05/30/15   Lori P Hvozdovic, PA-C     Vital Signs: BP 144/83 mmHg  Pulse 80  Temp(Src) 98.5 F (36.9 C) (Oral)  Resp 18  Ht 5\' 1"  (1.549 m)  Wt 133 lb (60.328 kg)  BMI 25.14 kg/m2  SpO2 98%  Physical Exam awake/alert; ant pelvic/suprapubic drain intact, dressing dry, site NT; output 80 cc beige colored fluid; cx's pend   Imaging: Ct Abdomen Pelvis W Contrast  06/03/2015   CLINICAL DATA:  Patient with 2 weeks of diarrhea, lethargy and nausea/ vomiting. Low pelvic pain. Weight loss. Elevated white blood cell count. Stool coming from urethra.  EXAM: CT ABDOMEN AND PELVIS WITH CONTRAST  TECHNIQUE: Multidetector CT imaging of the abdomen and pelvis was performed using the standard protocol following bolus administration of intravenous contrast.  CONTRAST:  151mL OMNIPAQUE IOHEXOL 300 MG/ML  SOLN  COMPARISON:  Abdominal radiographs 05/28/2015  FINDINGS: Lower chest: Normal heart size. No consolidative or nodular pulmonary opacities.  Hepatobiliary: Fatty deposition adjacent to the falciform ligament. Additional adjacent sub cm low-attenuation lesion within the left hepatic lobe too small to characterize however likely represents a small cyst. Gallbladder is decompressed. No intrahepatic or extrahepatic biliary ductal dilatation.  Pancreas: Unremarkable  Spleen: Unremarkable  Adrenals/Urinary Tract: The adrenal glands are normal.  Multiple parapelvic cyst within the left renal hilum. Kidneys enhance symmetrically with contrast. There is no hydronephrosis. Wall thickening of the urinary bladder which is decompressed. There is a small amount of gas anteriorly within the urinary bladder.  Stomach/Bowel: There is circumferential wall thickening and hyperenhancement of sigmoid colon and rectum. Multiple sigmoid colonic diverticula. There is an irregular rim enhancing fluid and gas containing collection along the right aspect of the sigmoid colon with one component measuring 6.5 x 2.7 cm (image 66; series 2). More cranially there is predominantly fat tissue stranding with an additional associated fluid collection measuring 2.7 x 2.7 cm along the bladder dome (image 71; series 2). There is a fistulous connection from this overlying abscess into urinary bladder best demonstrated on image 54; series 602. Within the left hemipelvis there is a 9 x 6 cm fluid collection (image 63; series 2) which appears to represent an adnexal cyst, likely ovarian in etiology.  Vascular/Lymphatic: No retroperitoneal lymphadenopathy. Aorta is normal in caliber. Peripheral calcified atherosclerotic plaque.  Other: Small amount a gas is demonstrated non dependently within the urinary bladder. Additionally small amount of gas is demonstrated within the vagina.  Musculoskeletal: No aggressive or acute appearing osseous lesions. Lower lumbar spine degenerative changes.  IMPRESSION: The sigmoid colon rectum is markedly thick walled most compatible with severe colitis and/or diverticulitis, which has perforated. Along the right aspect of sigmoid colon there are multiple rim enhancing fluid and gas containing collections most compatible with abscesses. There is an abscess along the bladder dome with an adjacent small focus of gas within the bladder lumen and significant wall thickening of the adjacent bladder wall, compatible with  colovesical fistula. Small amount of gas within  the vaginal fornix concerning for colovaginal fistula.  There is an 8 cm fluid collection within the left adnexa likely ovarian in etiology. This may represent a large ovarian cyst however superimposed infection is not excluded.  These results were called by telephone at the time of interpretation on 06/03/2015 at 3:13 pm to Dr. Cecille Rubin [facility] , who verbally acknowledged these results.   Electronically Signed   By: Lovey Newcomer M.D.   On: 06/03/2015 15:20   Ct Image Guided Drainage By Percutaneous Catheter  06/04/2015   CLINICAL DATA:  54 year old female with nonspecific colitis and colovesicular and colovaginal fistulous. Additionally, there is relatively small volume abscess in the perirectal space and superior to the bladder.  EXAM: CT IMAGE GUIDED DRAINAGE BY PERCUTANEOUS CATHETER  Date: 06/04/2015  PROCEDURE: 1. CT-guided drain placement Interventional Radiologist:  Criselda Peaches, MD  ANESTHESIA/SEDATION: Moderate (conscious) sedation was used. 2.5 mg Versed, 75 mcg Fentanyl were administered intravenously. The patient's vital signs were monitored continuously by radiology nursing throughout the procedure.  Sedation Time: 16 minutes  MEDICATIONS: None additional  TECHNIQUE: Informed consent was obtained from the patient following explanation of the procedure, risks, benefits and alternatives. The patient understands, agrees and consents for the procedure. All questions were addressed. A time out was performed.  An initial limited barium enema was performed. The patient was then placed prone and initial axial CT imaging performed. Contrast material opacifies the redundant rectosigmoid colon. There is no focal fluid or gas collection in the perirectal space. The previously noted collections are apparently mobile and have migrated more anteriorly. There is no safe window for access from a trans gluteal approach. The large cystic structure emanating from the left ovary is again noted. Contrast material is  noted collecting anterior to the bladder consistent with extravasation of the administered contrast agent and colovesicular fistula.  Therefore, the patient was maneuvered into the supine position. Axial CT imaging was performed. A small fluid and gas collection is noted superior and right lateral to the bladder dome. This is consistent with the findings on the recent CT scan. An appropriate skin entry site was selected and marked. The region was sterilely prepped and draped in standard fashion using Betadine skin prep. Local anesthesia was attained by infiltration with 1% lidocaine. A small dermatotomy was made. Under intermittent CT fluoroscopic guidance, an 18 gauge trocar needle was advanced through the right rectus abdominis muscle and into the fluid and gas collection. An Amplatz wire was coiled within the collection. This tract was then dilated to 10 [place] and a Cook 10.2 [place] all-purpose drainage catheter advanced over the wire and formed with the locking loop centered in the small fluid and gas collection. Aspiration yields approximately 7 mL thick purulent material. This was sent for culture.  The drainage catheter was secured to the skin with 0 Prolene suture. A sterile bandage was applied. The patient tolerated the procedure well.  COMPLICATIONS: None  Estimated blood loss:  0  IMPRESSION: 1. There is no safe trans gluteal approach for abscess drainage. 2. Successful placement of an anterior approach drainage catheter into the abscess collection superior to the dome of the bladder in the presumed location of the colovesicular fistula. Aspiration yielded approximately 7 mL thick purulent material which was sent for culture.  PLAN: 1. Maintain drain to JP bulb suction for at least the first 48-72 hours. After that time, conversion to gravity bag drainage can be considered. 2. Given suspected  colovesicular fistula, recommend drain injection under fluoroscopy prior to consideration of drain removal. It is  likely that this drain will need to be maintained in place for a prolonged period of time.  Signed,  Criselda Peaches, MD  [facility]  [facility]   Electronically Signed   By: Jacqulynn Cadet M.D.   On: 06/04/2015 17:10    Labs:  CBC:  Recent Labs  05/30/15 1614 06/03/15 1843 06/04/15 0448 06/06/15 0520  WBC 21.2 Repeated and verified X2.* 17.5* 10.5 10.7*  HGB 12.7 12.4 10.5* 11.4*  HCT 38.1 36.8 31.5* 34.4*  PLT 949.0* 579* 615* 560*    COAGS:  Recent Labs  06/04/15 1211  INR 1.09    BMP:  Recent Labs  05/30/15 1614 06/03/15 1843 06/04/15 0448 06/06/15 0520  NA 128* 128* 136 141  K 3.9 3.0* 3.6 3.8  CL 88* 91* 101 109  CO2 29 27 29 26   GLUCOSE 101* 105* 120* 131*  BUN 6 <5* <5* <5*  CALCIUM 9.9 9.2 8.8* 8.8*  CREATININE 0.41 0.53 0.49 0.51  GFRNONAA  --  >60 >60 >60  GFRAA  --  >60 >60 >60    LIVER FUNCTION TESTS:  Recent Labs  05/23/15 0824 05/28/15 0948 05/30/15 1614 06/03/15 1843  BILITOT 0.5 0.4 0.4 0.2*  AST 11 10 9 24   ALT 10 7 6  11*  ALKPHOS 81 64 63 52  PROT 7.1 6.8 7.3 6.7  ALBUMIN 3.3* 3.1* 3.3* 3.1*    Assessment and Plan: Severe diverticulitis or possible colitis with contained perforation and abscesses as well as colovesical fistula and 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 Aspiration yields 10 mL thick pus 06/04/15 Continue to monitor daily output and flush drain. Plans per CCS; ambulate; check final cx's; WBC down to 10.7(17.5), afebrile  Maintain drain to JP bulb suction for at least the first 48-72 hours. After that time, conversion to gravity bag drainage can be considered.  Given suspected colovesicular fistula, recommend drain injection under fluoroscopy prior to consideration of drain removal. It is likely that this drain will need to be maintained in place for a prolonged period of time.  Signed: D. Rowe Robert 06/06/2015, 9:27 AM   I spent a total of 15 minutes at the the patient's bedside AND on the patient's hospital floor or unit, greater than 50% of which was counseling/coordinating care for pelvic abscess drain

## 2015-06-06 NOTE — Progress Notes (Signed)
General Surgery Note  LOS: 3 days  POD -  1 Day Post-Op  Assessment/Plan: 1.  Diverticulitis with contained perforation and abscesses as well as colovesical fistula and possible colovaginal fistula  Pneumouria/Fecaluria  Perc drain - 06/04/2015  On Zosyn - 9/13 >>>  WBC - 10,700 - 06/06/2015    FLEXIBLE SIGMOIDOSCOPY - Armbruster - he only got to the rectosigmoid - no intraluminal mass  Plan:  Start clear liquids, recheck CBC, ambulate more  2.  Left ovarian cyst 3.  Arthritis 4.  History of melanoma 5.  Quit smoking as of admission 6.  DVT prophylaxis - Lovenox   Active Problems:   Perforated sigmoid colon   Malnutrition of moderate degree   Pelvic abscess in female   Colovaginal fistula   Colovesical fistula   Subjective:  Feels better.  She had 2 BM's this AM.  She is hungry for the first time in a month.  Mother in room. Objective:   Filed Vitals:   06/06/15 0557  BP: 144/83  Pulse: 80  Temp: 98.5 F (36.9 C)  Resp: 18     Intake/Output from previous day:  09/15 0701 - 09/16 0700 In: 2169.5 [P.O.:2; I.V.:2062.5; IV Piggyback:100] Out: 0962 [Urine:3200; Drains:80]  Intake/Output this shift:  Total I/O In: 0  Out: 600 [Urine:600]   Physical Exam:   General: Older WF who is alert and oriented.    HEENT: Normal. Pupils equal. .   Lungs: Clear.   Abdomen: Full lower abdomen, but minimal soreness.  Drain in lower abdomen.  80 cc recorded last 24 hours   Lab Results:    Recent Labs  06/04/15 0448 06/06/15 0520  WBC 10.5 10.7*  HGB 10.5* 11.4*  HCT 31.5* 34.4*  PLT 615* 560*    BMET   Recent Labs  06/04/15 0448 06/06/15 0520  NA 136 141  K 3.6 3.8  CL 101 109  CO2 29 26  GLUCOSE 120* 131*  BUN <5* <5*  CREATININE 0.49 0.51  CALCIUM 8.8* 8.8*    PT/INR   Recent Labs  06/04/15 1211  LABPROT 14.3  INR 1.09    ABG  No results for input(s): PHART, HCO3 in the last 72 hours.  Invalid input(s): PCO2, PO2   Studies/Results:  Ct Image  Guided Drainage By Percutaneous Catheter  06/04/2015   CLINICAL DATA:  54 year old female with nonspecific colitis and colovesicular and colovaginal fistulous. Additionally, there is relatively small volume abscess in the perirectal space and superior to the bladder.  EXAM: CT IMAGE GUIDED DRAINAGE BY PERCUTANEOUS CATHETER  Date: 06/04/2015  PROCEDURE: 1. CT-guided drain placement Interventional Radiologist:  Criselda Peaches, MD  ANESTHESIA/SEDATION: Moderate (conscious) sedation was used. 2.5 mg Versed, 75 mcg Fentanyl were administered intravenously. The patient's vital signs were monitored continuously by radiology nursing throughout the procedure.  Sedation Time: 16 minutes  MEDICATIONS: None additional  TECHNIQUE: Informed consent was obtained from the patient following explanation of the procedure, risks, benefits and alternatives. The patient understands, agrees and consents for the procedure. All questions were addressed. A time out was performed.  An initial limited barium enema was performed. The patient was then placed prone and initial axial CT imaging performed. Contrast material opacifies the redundant rectosigmoid colon. There is no focal fluid or gas collection in the perirectal space. The previously noted collections are apparently mobile and have migrated more anteriorly. There is no safe window for access from a trans gluteal approach. The large cystic structure emanating from the left  ovary is again noted. Contrast material is noted collecting anterior to the bladder consistent with extravasation of the administered contrast agent and colovesicular fistula.  Therefore, the patient was maneuvered into the supine position. Axial CT imaging was performed. A small fluid and gas collection is noted superior and right lateral to the bladder dome. This is consistent with the findings on the recent CT scan. An appropriate skin entry site was selected and marked. The region was sterilely prepped and  draped in standard fashion using Betadine skin prep. Local anesthesia was attained by infiltration with 1% lidocaine. A small dermatotomy was made. Under intermittent CT fluoroscopic guidance, an 18 gauge trocar needle was advanced through the right rectus abdominis muscle and into the fluid and gas collection. An Amplatz wire was coiled within the collection. This tract was then dilated to 10 [place] and a Cook 10.2 [place] all-purpose drainage catheter advanced over the wire and formed with the locking loop centered in the small fluid and gas collection. Aspiration yields approximately 7 mL thick purulent material. This was sent for culture.  The drainage catheter was secured to the skin with 0 Prolene suture. A sterile bandage was applied. The patient tolerated the procedure well.  COMPLICATIONS: None  Estimated blood loss:  0  IMPRESSION: 1. There is no safe trans gluteal approach for abscess drainage. 2. Successful placement of an anterior approach drainage catheter into the abscess collection superior to the dome of the bladder in the presumed location of the colovesicular fistula. Aspiration yielded approximately 7 mL thick purulent material which was sent for culture.  PLAN: 1. Maintain drain to JP bulb suction for at least the first 48-72 hours. After that time, conversion to gravity bag drainage can be considered. 2. Given suspected colovesicular fistula, recommend drain injection under fluoroscopy prior to consideration of drain removal. It is likely that this drain will need to be maintained in place for a prolonged period of time.  Signed,  Criselda Peaches, MD  [facility]  [facility]   Electronically Signed   By: Jacqulynn Cadet M.D.   On: 06/04/2015 17:10     Anti-infectives:   Anti-infectives    Start     Dose/Rate Route Frequency Ordered Stop   06/04/15 0400  piperacillin-tazobactam (ZOSYN) IVPB 3.375 g     3.375 g 12.5 mL/hr over 240  Minutes Intravenous Every 8 hours 06/03/15 2209     06/03/15 1900  piperacillin-tazobactam (ZOSYN) IVPB 4.5 g  Status:  Discontinued     4.5 g 200 mL/hr over 30 Minutes Intravenous  Once 06/03/15 1850 06/03/15 1852   06/03/15 1900  piperacillin-tazobactam (ZOSYN) IVPB 3.375 g     3.375 g 100 mL/hr over 30 Minutes Intravenous STAT 06/03/15 1853 06/03/15 2049      Alphonsa Overall, MD, FACS Pager: [name] Office: [phone] 06/06/2015

## 2015-06-06 NOTE — Progress Notes (Signed)
Progress Note   Subjective  Patient continues to feel much better. No abdominal pain. No fevers. WBC around 10. Flex sig yesterday as below, could not complete due to edema.   Objective   Vital signs in last 24 hours: Temp:  [97.8 F (36.6 C)-98.5 F (36.9 C)] 98.5 F (36.9 C) (09/16 0557) Pulse Rate:  [80-103] 80 (09/16 0557) Resp:  [16-21] 18 (09/16 0557) BP: (123-172)/(69-94) 144/83 mmHg (09/16 0557) SpO2:  [96 %-100 %] 98 % (09/16 0557) Last BM Date: 06/05/15 General:    white female in NAD Heart:  Regular rate and rhythm; no murmurs Lungs: Respirations even and unlabored, lungs CTA bilaterally Abdomen:  Soft, nontender and nondistended. Normal bowel sounds. Extremities:  Without edema. Neurologic:  Alert and oriented,  grossly normal neurologically. Psych:  Cooperative. Normal mood and affect.  Intake/Output from previous day: 09/15 0701 - 09/16 0700 In: 2169.5 [P.O.:2; I.V.:2062.5; IV Piggyback:100] Out: 3280 [Urine:3200; Drains:80] Intake/Output this shift:    Lab Results:  Recent Labs  06/03/15 1843 06/04/15 0448 06/06/15 0520  WBC 17.5* 10.5 10.7*  HGB 12.4 10.5* 11.4*  HCT 36.8 31.5* 34.4*  PLT 579* 615* 560*   BMET  Recent Labs  06/03/15 1843 06/04/15 0448 06/06/15 0520  NA 128* 136 141  K 3.0* 3.6 3.8  CL 91* 101 109  CO2 27 29 26   GLUCOSE 105* 120* 131*  BUN <5* <5* <5*  CREATININE 0.53 0.49 0.51  CALCIUM 9.2 8.8* 8.8*   LFT  Recent Labs  06/03/15 1843  PROT 6.7  ALBUMIN 3.1*  AST 24  ALT 11*  ALKPHOS 52  BILITOT 0.2*   PT/INR  Recent Labs  06/04/15 1211  LABPROT 14.3  INR 1.09    Studies/Results: Ct Image Guided Drainage By Percutaneous Catheter  06/04/2015   CLINICAL DATA:  54 year old female with nonspecific colitis and colovesicular and colovaginal fistulous. Additionally, there is relatively small volume abscess in the perirectal space and superior to the bladder.  EXAM: CT IMAGE GUIDED DRAINAGE BY  PERCUTANEOUS CATHETER  Date: 06/04/2015  PROCEDURE: 1. CT-guided drain placement Interventional Radiologist:  Criselda Peaches, MD  ANESTHESIA/SEDATION: Moderate (conscious) sedation was used. 2.5 mg Versed, 75 mcg Fentanyl were administered intravenously. The patient's vital signs were monitored continuously by radiology nursing throughout the procedure.  Sedation Time: 16 minutes  MEDICATIONS: None additional  TECHNIQUE: Informed consent was obtained from the patient following explanation of the procedure, risks, benefits and alternatives. The patient understands, agrees and consents for the procedure. All questions were addressed. A time out was performed.  An initial limited barium enema was performed. The patient was then placed prone and initial axial CT imaging performed. Contrast material opacifies the redundant rectosigmoid colon. There is no focal fluid or gas collection in the perirectal space. The previously noted collections are apparently mobile and have migrated more anteriorly. There is no safe window for access from a trans gluteal approach. The large cystic structure emanating from the left ovary is again noted. Contrast material is noted collecting anterior to the bladder consistent with extravasation of the administered contrast agent and colovesicular fistula.  Therefore, the patient was maneuvered into the supine position. Axial CT imaging was performed. A small fluid and gas collection is noted superior and right lateral to the bladder dome. This is consistent with the findings on the recent CT scan. An appropriate skin entry site was selected and marked. The region was sterilely prepped and draped in standard fashion using  Betadine skin prep. Local anesthesia was attained by infiltration with 1% lidocaine. A small dermatotomy was made. Under intermittent CT fluoroscopic guidance, an 18 gauge trocar needle was advanced through the right rectus abdominis muscle and into the fluid and gas  collection. An Amplatz wire was coiled within the collection. This tract was then dilated to 10 [place] and a Cook 10.2 [place] all-purpose drainage catheter advanced over the wire and formed with the locking loop centered in the small fluid and gas collection. Aspiration yields approximately 7 mL thick purulent material. This was sent for culture.  The drainage catheter was secured to the skin with 0 Prolene suture. A sterile bandage was applied. The patient tolerated the procedure well.  COMPLICATIONS: None  Estimated blood loss:  0  IMPRESSION: 1. There is no safe trans gluteal approach for abscess drainage. 2. Successful placement of an anterior approach drainage catheter into the abscess collection superior to the dome of the bladder in the presumed location of the colovesicular fistula. Aspiration yielded approximately 7 mL thick purulent material which was sent for culture.  PLAN: 1. Maintain drain to JP bulb suction for at least the first 48-72 hours. After that time, conversion to gravity bag drainage can be considered. 2. Given suspected colovesicular fistula, recommend drain injection under fluoroscopy prior to consideration of drain removal. It is likely that this drain will need to be maintained in place for a prolonged period of time.  Signed,  Criselda Peaches, MD  [facility]  [facility] Radiology   Electronically Signed   By: Jacqulynn Cadet M.D.   On: 06/04/2015 17:10       Assessment / Plan:   54 y/o female, previously healthy without bowel symptoms, who presented with one month history of severe diarrhea, abdominal pain, and new leukocytosis, found to have severe distal colon inflammation with abscess formation and colovesical / colovaginal fistula. She has responded well to IV antibiotics and IR guided drainage of one abscess thus far with significant downtrend in her leukocytosis.   Unclear if this presentation is due to diverticulitis (findings  on CT in approximation to diverticulosis) or underlying IBD (would favor Crohn's in this setting). I performed flex sig yesterday in attempts to clarify her diagnosis, however there was significant edema and restricted mobility in the rectosigmoid / distal sigmoid colon. Given her perforation I was hesitant to push through resistance on this exam and will allow more time for the inflammation to decrease prior to next attempt. We can consider repeat flex sig later this weekend or early next week, pending her course.   If this ultimately does turn out to be IBD, regarding her options for medical therapy, given the severity of her perforating disease with fistula formation, she would warrant aggressive immunosuppression up front with anti-TNF therapy, likely in conjunction with immunomodulator, once her infection is controlled. Will await her course and follow with you.  Please call with questions / concerns, or changes in her status. Thanks  Loomis Cellar, MD [facility] Pager [phone]  Active Problems:   Perforated sigmoid colon   Malnutrition of moderate degree   Pelvic abscess in female     LOS: 3 days   [name]  06/06/2015, 8:36 AM

## 2015-06-07 DIAGNOSIS — K631 Perforation of intestine (nontraumatic): Secondary | ICD-10-CM | POA: Insufficient documentation

## 2015-06-07 LAB — CBC WITH DIFFERENTIAL/PLATELET
BASOS PCT: 1 %
Basophils Absolute: 0.1 10*3/uL (ref 0.0–0.1)
EOS ABS: 0.2 10*3/uL (ref 0.0–0.7)
Eosinophils Relative: 3 %
HEMATOCRIT: 35.8 % — AB (ref 36.0–46.0)
Hemoglobin: 11.6 g/dL — ABNORMAL LOW (ref 12.0–15.0)
LYMPHS ABS: 2.1 10*3/uL (ref 0.7–4.0)
Lymphocytes Relative: 22 %
MCH: 30.4 pg (ref 26.0–34.0)
MCHC: 32.4 g/dL (ref 30.0–36.0)
MCV: 94 fL (ref 78.0–100.0)
MONO ABS: 0.9 10*3/uL (ref 0.1–1.0)
MONOS PCT: 10 %
NEUTROS ABS: 6.3 10*3/uL (ref 1.7–7.7)
Neutrophils Relative %: 64 %
Platelets: 575 10*3/uL — ABNORMAL HIGH (ref 150–400)
RBC: 3.81 MIL/uL — ABNORMAL LOW (ref 3.87–5.11)
RDW: 13.7 % (ref 11.5–15.5)
WBC: 9.7 10*3/uL (ref 4.0–10.5)

## 2015-06-07 LAB — CULTURE, ROUTINE-ABSCESS: SPECIAL REQUESTS: NORMAL

## 2015-06-07 MED ORDER — METRONIDAZOLE 500 MG PO TABS
500.0000 mg | ORAL_TABLET | Freq: Three times a day (TID) | ORAL | Status: DC
  Administered 2015-06-07 – 2015-06-09 (×6): 500 mg via ORAL
  Filled 2015-06-07 (×9): qty 1

## 2015-06-07 MED ORDER — CIPROFLOXACIN HCL 500 MG PO TABS
500.0000 mg | ORAL_TABLET | Freq: Two times a day (BID) | ORAL | Status: DC
  Administered 2015-06-07 – 2015-06-09 (×5): 500 mg via ORAL
  Filled 2015-06-07 (×7): qty 1

## 2015-06-07 NOTE — Progress Notes (Signed)
Referring Physician(s): Dr Lucia Gaskins  Chief Complaint:  Pelvic abscess  Subjective:  Abscess drain placed 9/14 Still significant drainage Feeling better Diet advancing   Allergies: Review of patient's allergies indicates no known allergies.  Medications: Prior to Admission medications   Medication Sig Start Date End Date Taking? Authorizing Provider  DULoxetine (CYMBALTA) 60 MG capsule Take 60 mg by mouth daily.   Yes Historical Provider, MD  Na Sulfate-K Sulfate-Mg Sulf SOLN Please take as directed for colonoscopy 05/30/15   Lori P Hvozdovic, PA-C     Vital Signs: BP 165/92 mmHg  Pulse 65  Temp(Src) 98.1 F (36.7 C) (Oral)  Resp 18  Ht 5\' 1"  (1.549 m)  Wt 133 lb (60.328 kg)  BMI 25.14 kg/m2  SpO2 99%  Physical Exam  Abdominal: Soft. Bowel sounds are normal. There is no tenderness.  Skin:  Low abd abscess drain intact Site clean and dry NT no bleeding Output 40 cc yesterday Milky yellow output: candida afeb Wbc 9.7  Nursing note and vitals reviewed.   Imaging: Ct Abdomen Pelvis W Contrast  06/03/2015   CLINICAL DATA:  Patient with 2 weeks of diarrhea, lethargy and nausea/ vomiting. Low pelvic pain. Weight loss. Elevated white blood cell count. Stool coming from urethra.  EXAM: CT ABDOMEN AND PELVIS WITH CONTRAST  TECHNIQUE: Multidetector CT imaging of the abdomen and pelvis was performed using the standard protocol following bolus administration of intravenous contrast.  CONTRAST:  151mL OMNIPAQUE IOHEXOL 300 MG/ML  SOLN  COMPARISON:  Abdominal radiographs 05/28/2015  FINDINGS: Lower chest: Normal heart size. No consolidative or nodular pulmonary opacities.  Hepatobiliary: Fatty deposition adjacent to the falciform ligament. Additional adjacent sub cm low-attenuation lesion within the left hepatic lobe too small to characterize however likely represents a small cyst. Gallbladder is decompressed. No intrahepatic or extrahepatic biliary ductal dilatation.  Pancreas:  Unremarkable  Spleen: Unremarkable  Adrenals/Urinary Tract: The adrenal glands are normal. Multiple parapelvic cyst within the left renal hilum. Kidneys enhance symmetrically with contrast. There is no hydronephrosis. Wall thickening of the urinary bladder which is decompressed. There is a small amount of gas anteriorly within the urinary bladder.  Stomach/Bowel: There is circumferential wall thickening and hyperenhancement of sigmoid colon and rectum. Multiple sigmoid colonic diverticula. There is an irregular rim enhancing fluid and gas containing collection along the right aspect of the sigmoid colon with one component measuring 6.5 x 2.7 cm (image 66; series 2). More cranially there is predominantly fat tissue stranding with an additional associated fluid collection measuring 2.7 x 2.7 cm along the bladder dome (image 71; series 2). There is a fistulous connection from this overlying abscess into urinary bladder best demonstrated on image 54; series 602. Within the left hemipelvis there is a 9 x 6 cm fluid collection (image 63; series 2) which appears to represent an adnexal cyst, likely ovarian in etiology.  Vascular/Lymphatic: No retroperitoneal lymphadenopathy. Aorta is normal in caliber. Peripheral calcified atherosclerotic plaque.  Other: Small amount a gas is demonstrated non dependently within the urinary bladder. Additionally small amount of gas is demonstrated within the vagina.  Musculoskeletal: No aggressive or acute appearing osseous lesions. Lower lumbar spine degenerative changes.  IMPRESSION: The sigmoid colon rectum is markedly thick walled most compatible with severe colitis and/or diverticulitis, which has perforated. Along the right aspect of sigmoid colon there are multiple rim enhancing fluid and gas containing collections most compatible with abscesses. There is an abscess along the bladder dome with an adjacent small focus of gas  within the bladder lumen and significant wall thickening of  the adjacent bladder wall, compatible with colovesical fistula. 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  There is an 8 cm fluid collection within the left adnexa likely ovarian in etiology. This may represent a large ovarian cyst however superimposed infection is not excluded.  These results were called by telephone at the time of interpretation on 06/03/2015 at 3:13 pm to Dr. Cecille Rubin [facility] , who verbally acknowledged these results.   Electronically Signed   By: Lovey Newcomer M.D.   On: 06/03/2015 15:20   Ct Image Guided Drainage By Percutaneous Catheter  06/04/2015   CLINICAL DATA:  54 year old female with nonspecific colitis and colovesicular and colovaginal fistulous. Additionally, there is relatively small volume abscess in the perirectal space and superior to the bladder.  EXAM: CT IMAGE GUIDED DRAINAGE BY PERCUTANEOUS CATHETER  Date: 06/04/2015  PROCEDURE: 1. CT-guided drain placement Interventional Radiologist:  Criselda Peaches, MD  ANESTHESIA/SEDATION: Moderate (conscious) sedation was used. 2.5 mg Versed, 75 mcg Fentanyl were administered intravenously. The patient's vital signs were monitored continuously by radiology nursing throughout the procedure.  Sedation Time: 16 minutes  MEDICATIONS: None additional  TECHNIQUE: Informed consent was obtained from the patient following explanation of the procedure, risks, benefits and alternatives. The patient understands, agrees and consents for the procedure. All questions were addressed. A time out was performed.  An initial limited barium enema was performed. The patient was then placed prone and initial axial CT imaging performed. Contrast material opacifies the redundant rectosigmoid colon. There is no focal fluid or gas collection in the perirectal space. The previously noted collections are apparently mobile and have migrated more anteriorly. There is no safe window for access from a trans gluteal approach. The  large cystic structure emanating from the left ovary is again noted. Contrast material is noted collecting anterior to the bladder consistent with extravasation of the administered contrast agent and colovesicular fistula.  Therefore, the patient was maneuvered into the supine position. Axial CT imaging was performed. A small fluid and gas collection is noted superior and right lateral to the bladder dome. This is consistent with the findings on the recent CT scan. An appropriate skin entry site was selected and marked. The region was sterilely prepped and draped in standard fashion using Betadine skin prep. Local anesthesia was attained by infiltration with 1% lidocaine. A small dermatotomy was made. Under intermittent CT fluoroscopic guidance, an 18 gauge trocar needle was advanced through the right rectus abdominis muscle and into the fluid and gas collection. An Amplatz wire was coiled within the collection. This tract was then dilated to 10 [place] and a Cook 10.2 [place] all-purpose drainage catheter advanced over the wire and formed with the locking loop centered in the small fluid and gas collection. Aspiration yields approximately 7 mL thick purulent material. This was sent for culture.  The drainage catheter was secured to the skin with 0 Prolene suture. A sterile bandage was applied. The patient tolerated the procedure well.  COMPLICATIONS: None  Estimated blood loss:  0  IMPRESSION: 1. There is no safe trans gluteal approach for abscess drainage. 2. Successful placement of an anterior approach drainage catheter into the abscess collection superior to the dome of the bladder in the presumed location of the colovesicular fistula. Aspiration yielded approximately 7 mL thick purulent material which was sent for culture.  PLAN: 1. Maintain drain to JP bulb suction for at least the first 48-72  hours. After that time, conversion to gravity bag drainage can be considered. 2. Given suspected colovesicular fistula,  recommend drain injection under fluoroscopy prior to consideration of drain removal. It is likely that this drain will need to be maintained in place for a prolonged period of time.  Signed,  Criselda Peaches, MD  [facility]  [facility]   Electronically Signed   By: Jacqulynn Cadet M.D.   On: 06/04/2015 17:10    Labs:  CBC:  Recent Labs  06/03/15 1843 06/04/15 0448 06/06/15 0520 06/07/15 0535  WBC 17.5* 10.5 10.7* 9.7  HGB 12.4 10.5* 11.4* 11.6*  HCT 36.8 31.5* 34.4* 35.8*  PLT 579* 615* 560* 575*    COAGS:  Recent Labs  06/04/15 1211  INR 1.09    BMP:  Recent Labs  05/30/15 1614 06/03/15 1843 06/04/15 0448 06/06/15 0520  NA 128* 128* 136 141  K 3.9 3.0* 3.6 3.8  CL 88* 91* 101 109  CO2 29 27 29 26   GLUCOSE 101* 105* 120* 131*  BUN 6 <5* <5* <5*  CALCIUM 9.9 9.2 8.8* 8.8*  CREATININE 0.41 0.53 0.49 0.51  GFRNONAA  --  >60 >60 >60  GFRAA  --  >60 >60 >60    LIVER FUNCTION TESTS:  Recent Labs  05/23/15 0824 05/28/15 0948 05/30/15 1614 06/03/15 1843  BILITOT 0.5 0.4 0.4 0.2*  AST 11 10 9 24   ALT 10 7 6  11*  ALKPHOS 81 64 63 52  PROT 7.1 6.8 7.3 6.7  ALBUMIN 3.3* 3.1* 3.3* 3.1*    Assessment and Plan:  Pelvic abscess drain in place Ambulating Diet advancing Home with drain soon---per pt Plan per [facility] Contact IR if home with drain---we can get pt to drain clinic as needed  Signed: TURPIN,PAMELA A 06/07/2015, 10:29 AM   I spent a total of 15 Minutes at the the patient's bedside AND on the patient's hospital floor or unit, greater than 50% of which was counseling/coordinating care for abscess drain

## 2015-06-07 NOTE — Progress Notes (Signed)
2 Days Post-Op  Subjective: Pain much improved, tolerating liuquids  Objective: Vital signs in last 24 hours: Temp:  [98.1 F (36.7 C)-98.3 F (36.8 C)] 98.1 F (36.7 C) (09/17 0529) Pulse Rate:  [65-82] 65 (09/17 0529) Resp:  [18] 18 (09/17 0529) BP: (140-165)/(77-92) 165/92 mmHg (09/17 0529) SpO2:  [98 %-99 %] 99 % (09/17 0529) Last BM Date: 06/06/15  Intake/Output from previous day: 09/16 0701 - 09/17 0700 In: 4500 [P.O.:1440; I.V.:3000; IV Piggyback:50] Out: 0263 [Urine:3550; Drains:40; Stool:2] Intake/Output this shift:    General appearance: cooperative and appears stated age Resp: clear to auscultation bilaterally Cardio: regular rate and rhythm, S1, S2 normal, no murmur, click, rub or gallop GI: soft, non-tender; bowel sounds normal; no masses,  no organomegaly Extremities: extremities normal, atraumatic, no cyanosis or edema  Lab Results:   Recent Labs  06/06/15 0520 06/07/15 0535  WBC 10.7* 9.7  HGB 11.4* 11.6*  HCT 34.4* 35.8*  PLT 560* 575*   BMET  Recent Labs  06/06/15 0520  NA 141  K 3.8  CL 109  CO2 26  GLUCOSE 131*  BUN <5*  CREATININE 0.51  CALCIUM 8.8*   PT/INR  Recent Labs  06/04/15 1211  LABPROT 14.3  INR 1.09   ABG No results for input(s): PHART, HCO3 in the last 72 hours.  Invalid input(s): PCO2, PO2  Studies/Results: No results found.  Anti-infectives: Anti-infectives    Start     Dose/Rate Route Frequency Ordered Stop   06/04/15 0400  piperacillin-tazobactam (ZOSYN) IVPB 3.375 g     3.375 g 12.5 mL/hr over 240 Minutes Intravenous Every 8 hours 06/03/15 2209     06/03/15 1900  piperacillin-tazobactam (ZOSYN) IVPB 4.5 g  Status:  Discontinued     4.5 g 200 mL/hr over 30 Minutes Intravenous  Once 06/03/15 1850 06/03/15 1852   06/03/15 1900  piperacillin-tazobactam (ZOSYN) IVPB 3.375 g     3.375 g 100 mL/hr over 30 Minutes Intravenous STAT 06/03/15 1853 06/03/15 2049      Assessment/Plan: s/p  Procedure(s): FLEXIBLE SIGMOIDOSCOPY (N/A) Change to PO abx Decrease IVF Remove foley  LOS: 4 days    [name] 06/07/2015

## 2015-06-07 NOTE — Progress Notes (Signed)
    Progress Note   Subjective  feels great, ambulating in halls   Objective   Vital signs in last 24 hours: Temp:  [98.1 F (36.7 C)-98.3 F (36.8 C)] 98.1 F (36.7 C) (09/17 0529) Pulse Rate:  [65-82] 65 (09/17 0529) Resp:  [18] 18 (09/17 0529) BP: (140-165)/(77-92) 165/92 mmHg (09/17 0529) SpO2:  [98 %-99 %] 99 % (09/17 0529) Last BM Date: 06/06/15 General:    Pleasant white female in NAD Heart:  Regular rate and rhythm Lungs: Respirations even and unlabored, lungs CTA bilaterally Abdomen:  Soft, nontender . Normal bowel sounds. Milky secretions in perc drain bulb Extremities:  Without edema. Neurologic:  Alert and oriented,  grossly normal neurologically. Psych:  Cooperative. Normal mood and affect.  Intake/Output from previous day: 09/16 0701 - 09/17 0700 In: 4500 [P.O.:1440; I.V.:3000; IV Piggyback:50] Out: 5956 [Urine:3550; Drains:40; Stool:2] Intake/Output this shift:    Lab Results:  Recent Labs  06/06/15 0520 06/07/15 0535  WBC 10.7* 9.7  HGB 11.4* 11.6*  HCT 34.4* 35.8*  PLT 560* 575*   BMET  Recent Labs  06/06/15 0520  NA 141  K 3.8  CL 109  CO2 26  GLUCOSE 131*  BUN <5*  CREATININE 0.51  CALCIUM 8.8*      Assessment / Plan:    54 year old female  With distal colon inflammation with abscess and colovesical / colovaginal fistula. She is s/p perc drainage by IR of one abscess. Improving on IV antibiotics. WBC has normalized. We attempted sigmoidoscopy yesterday but significant edema precluded passage of scope. Etiology unclear, diverticulitis ?  IBD?  Surgery following. Depending on clinical course we may reattempt sigmoidoscopy in a few days. She feels well today. Tolerating clears. Continue IV flagyl / cipro and supportive care for now.      LOS: 4 days   [name]  06/07/2015, 9:50 AM   Attending Addendum: I have taken an interval history and examined the patient. I agree with the Advanced Practitioner's note and impression. 54  y/o female presenting with severe distal colon inflammation with abscess formation and colovesical / colovaginal fistula. She has responded well to IV antibiotics and IR guided drainage of one abscess thus far with significant downtrend in her leukocytosis. Unclear if this presentation is due to diverticulitis or underlying IBD (would favor Crohn's in this setting). I performed flex sig 2 days ago in attempts to clarify her diagnosis, however there was significant edema and restricted mobility in the rectosigmoid / distal sigmoid colon and the exam could not be safely completed.   She is doing well recovering thus far. Possible discharge tomorrow. We can perform flex sig as outpatient in the near future or do it while inpatient depending on when she wishes to go home.  If this ultimately does turn out to be IBD, regarding her options for medical therapy, given the severity of her perforating disease with fistula formation, she would warrant aggressive immunosuppression.   We will continue to follow.  [place] Cellar, MD [facility] Pager [phone]

## 2015-06-08 MED ORDER — PANTOPRAZOLE SODIUM 40 MG PO TBEC
40.0000 mg | DELAYED_RELEASE_TABLET | Freq: Every day | ORAL | Status: DC
  Administered 2015-06-08 – 2015-06-09 (×2): 40 mg via ORAL
  Filled 2015-06-08 (×2): qty 1

## 2015-06-08 NOTE — Progress Notes (Signed)
      Progress Note   Subjective  Patient feels great. States she doesn't have much pain, Tolerating diet, overall doing well.    Objective   Vital signs in last 24 hours: Temp:  [97.8 F (36.6 C)-98.4 F (36.9 C)] 97.8 F (36.6 C) (09/18 0539) Pulse Rate:  [73-77] 77 (09/18 0539) Resp:  [15-16] 15 (09/18 0539) BP: (140-153)/(79-83) 140/83 mmHg (09/18 0539) SpO2:  [99 %-100 %] 100 % (09/18 0539) Last BM Date: 06/08/15 General:    white female in NAD Heart:  Regular rate and rhythm; no murmurs Lungs: Respirations even and unlabored, lungs CTA bilaterally Abdomen:  Soft, nontender and nondistended. Normal bowel sounds. Extremities:  Without edema. Neurologic:  Alert and oriented,  grossly normal neurologically. Psych:  Cooperative. Normal mood and affect.  Intake/Output from previous day: 09/17 0701 - 09/18 0700 In: 185 [I.V.:160] Out: 3280 [Urine:3250; Drains:30] Intake/Output this shift: Total I/O In: -  Out: 150 [Urine:150]  Lab Results:  Recent Labs  06/06/15 0520 06/07/15 0535  WBC 10.7* 9.7  HGB 11.4* 11.6*  HCT 34.4* 35.8*  PLT 560* 575*   BMET  Recent Labs  06/06/15 0520  NA 141  K 3.8  CL 109  CO2 26  GLUCOSE 131*  BUN <5*  CREATININE 0.51  CALCIUM 8.8*   LFT No results for input(s): PROT, ALBUMIN, AST, ALT, ALKPHOS, BILITOT, BILIDIR, IBILI in the last 72 hours. PT/INR No results for input(s): LABPROT, INR in the last 72 hours.  Studies/Results: No results found.     Assessment / Plan:   54 y/o female presenting with severe distal colon inflammation with abscess formation and colovesical / colovaginal fistula. She has responded well to IV antibiotics and IR guided drainage of one abscess thus far with significant downtrend in her leukocytosis. Unclear if this presentation is due to perforating diverticulitis or underlying IBD (would favor Crohn's in this setting). I performed flex sig a few days ago in attempts to clarify her diagnosis,  however there was significant edema and restricted mobility in the rectosigmoid / distal sigmoid colon and the exam could not be safely completed.   She is doing well recovering thus far. Possible discharge tomorrow. We will see if we can perform flex sig tomorrow prior to discharge however if timing doesn't work for her we can try to fit her in as outpatient in the near future. If this ultimately does turn out to be IBD, regarding her options for medical therapy, given the severity of her perforating disease with fistula formation, she would warrant aggressive immunosuppression.   Dr. Hilarie Fredrickson on service tomorrow and will touch base regarding possibility of flex sig or not.   [place] Cellar, MD [facility] Pager [phone]  Active Problems:   Perforated sigmoid colon   Malnutrition of moderate degree   Pelvic abscess in female   Colovaginal fistula   Colovesical fistula   Palliative care encounter   DNR (do not resuscitate)   Large bowel perforation     LOS: 5 days   Renelda Loma Armbruster  06/08/2015, 5:16 PM

## 2015-06-08 NOTE — Progress Notes (Signed)
3 Days Post-Op  Subjective: Patient feels much better.  Has some pressure around the drain site Still with some sediment in her urine Foley bag  Objective: Vital signs in last 24 hours: Temp:  [97.8 F (36.6 C)-98.4 F (36.9 C)] 97.8 F (36.6 C) (09/18 0539) Pulse Rate:  [73-80] 77 (09/18 0539) Resp:  [15-18] 15 (09/18 0539) BP: (140-153)/(69-83) 140/83 mmHg (09/18 0539) SpO2:  [99 %-100 %] 100 % (09/18 0539) Last BM Date: 06/06/15  Intake/Output from previous day: 09/17 0701 - 09/18 0700 In: 185 [I.V.:160] Out: 3280 [Urine:3250; Drains:30] Intake/Output this shift:    General appearance: alert, cooperative and no distress GI: soft, minimal suprapubic tenderness; no peritonitis Drain - tan milky drainage  Lab Results:   Recent Labs  06/06/15 0520 06/07/15 0535  WBC 10.7* 9.7  HGB 11.4* 11.6*  HCT 34.4* 35.8*  PLT 560* 575*   BMET  Recent Labs  06/06/15 0520  NA 141  K 3.8  CL 109  CO2 26  GLUCOSE 131*  BUN <5*  CREATININE 0.51  CALCIUM 8.8*   PT/INR No results for input(s): LABPROT, INR in the last 72 hours. ABG No results for input(s): PHART, HCO3 in the last 72 hours.  Invalid input(s): PCO2, PO2  Studies/Results: No results found.  Anti-infectives: Anti-infectives    Start     Dose/Rate Route Frequency Ordered Stop   06/07/15 1000  ciprofloxacin (CIPRO) tablet 500 mg     500 mg Oral 2 times daily 06/07/15 0933     06/07/15 1000  metroNIDAZOLE (FLAGYL) tablet 500 mg     500 mg Oral 3 times per day 06/07/15 0933     06/04/15 0400  piperacillin-tazobactam (ZOSYN) IVPB 3.375 g  Status:  Discontinued     3.375 g 12.5 mL/hr over 240 Minutes Intravenous Every 8 hours 06/03/15 2209 06/07/15 0933   06/03/15 1900  piperacillin-tazobactam (ZOSYN) IVPB 4.5 g  Status:  Discontinued     4.5 g 200 mL/hr over 30 Minutes Intravenous  Once 06/03/15 1850 06/03/15 1852   06/03/15 1900  piperacillin-tazobactam (ZOSYN) IVPB 3.375 g     3.375 g 100 mL/hr over  30 Minutes Intravenous STAT 06/03/15 1853 06/03/15 2049      Assessment/Plan: s/p Procedure(s): FLEXIBLE SIGMOIDOSCOPY (N/A) Plan for discharge tomorrow Will remove Foley today.  If able to void, will probably be ready for discharge tomorrow  Will eventually need sigmoid resection/ repair of bladder/ closure of colovaginal fistula. GI - considering another outpatient flex sig to rule out IBD  LOS: 5 days    TSUEI,MATTHEW K. 06/08/2015

## 2015-06-08 NOTE — Progress Notes (Signed)
The patient is receiving Protonix by the intravenous route.  Based on criteria approved by the Pharmacy and [place], the medication is being converted to the equivalent oral dose form.  These criteria include: -No Active GI bleeding -Able to tolerate diet of full liquids (or better) or tube feeding OR able to tolerate other medications by the oral or enteral route  If you have any questions about this conversion, please contact the Pharmacy Department (ext [phone]).  Thank you.  Biagio Borg, [facility] 06/08/2015 11:13 AM

## 2015-06-09 ENCOUNTER — Other Ambulatory Visit: Payer: Self-pay | Admitting: Radiology

## 2015-06-09 DIAGNOSIS — N739 Female pelvic inflammatory disease, unspecified: Secondary | ICD-10-CM

## 2015-06-09 DIAGNOSIS — N321 Vesicointestinal fistula: Secondary | ICD-10-CM

## 2015-06-09 DIAGNOSIS — N731 Chronic parametritis and pelvic cellulitis: Secondary | ICD-10-CM | POA: Insufficient documentation

## 2015-06-09 MED ORDER — SACCHAROMYCES BOULARDII 250 MG PO CAPS
250.0000 mg | ORAL_CAPSULE | Freq: Two times a day (BID) | ORAL | Status: DC
  Filled 2015-06-09 (×2): qty 1

## 2015-06-09 MED ORDER — FLUCONAZOLE 200 MG PO TABS: 200.0000 mg | ORAL_TABLET | Freq: Every day | ORAL | Status: DC

## 2015-06-09 MED ORDER — CIPROFLOXACIN HCL 500 MG PO TABS: 500.0000 mg | ORAL_TABLET | Freq: Two times a day (BID) | ORAL | Status: DC

## 2015-06-09 MED ORDER — HYDROCODONE-ACETAMINOPHEN 5-325 MG PO TABS: 1.0000 | ORAL_TABLET | Freq: Four times a day (QID) | ORAL | Status: DC | PRN

## 2015-06-09 MED ORDER — METRONIDAZOLE 500 MG PO TABS: 500.0000 mg | ORAL_TABLET | Freq: Three times a day (TID) | ORAL | Status: DC

## 2015-06-09 MED ORDER — SACCHAROMYCES BOULARDII 250 MG PO CAPS: 250.0000 mg | ORAL_CAPSULE | Freq: Two times a day (BID) | ORAL | Status: DC

## 2015-06-09 NOTE — Discharge Summary (Signed)
[facility] Discharge Summary   Patient ID: [name] MRN: [id number] DOB/AGE: [birth year] 54 y.o.  Admit date: 06/03/2015 Discharge date: 06/09/2015  Admitting Diagnosis: Large bowel perforation Pelvic abscess Colovaginal fistula Colovesical fistula  Discharge Diagnosis Patient Active Problem List   Diagnosis Date Noted  . Large bowel perforation   . Palliative care encounter 06/06/2015  . DNR (do not resuscitate) 06/06/2015  . Colovaginal fistula   . Colovesical fistula   . Pelvic abscess in female   . Malnutrition of moderate degree 06/04/2015  . Perforated sigmoid colon 06/03/2015  . Diarrhea 05/28/2015  . Arthritis 05/20/2015  . Melanoma of skin 05/20/2015  . Depression 05/20/2015    Consultants GI - Dr. Havery Moros Radiology - Dr. Laurence Ferrari Palliative care (re: code status) - Wadie Lessen  Imaging: No results found.  Procedures None  Hospital Course:  54 year old female who presents with an approximately one month gradually worsening illness. She initially began having diarrhea approximately one month ago. On my questioning this was associated with some lower abdominal pain but not severe. She initially was seen by primary care and stool cultures and C. Difficile were obtained and she was started on Cipro empirically. Cultures and C. Difficile were negative. She had no improvement on the Cipro. Her illness gradually worsened. She presented back to primary care approximately one week ago with persistent diarrhea and some increase in lower abdominal pain. She was also found to have worsening leukocytosis. She was referred to GI for evaluation which was performed on September 9. A CT scan of the abdomen and pelvis was ordered at that time and obtained today showing significant findings as described below and she was told to come to the emergency department because of the CT findings. The patient states that she has been having stool and also gas with  urination for at least 3 weeks. She feels she is also having gas per vagina and possibly some stool leakage as well. Her abdominal pain has been left lower quadrant and right lower quadrant, intermittent and occasionally somewhat severe but not constant. She has had occasional nausea without vomiting and lack of appetite.She actually denies any severe abdominal pain this evening. No apparent fever or chills although she has been having some night sweats. Also malaise and weakness and she has lost almost 15 pounds. She has a history of colonoscopy with findings of a colon polyp about 6 years ago but no other history of GI illness or colitis or any chronic GI complaints.  Workup showed severe diverticulitis or possible colitis with contained perforation and abscess.  Likely has colovesical and colovaginal fistula as well.  Patient was admitted for conservative management and IV antibiotics and was transferred to the floor.  Her pain improved slowly after IR placed perc drain into the abscess.  Dr. Havery Moros attempted flex sig, but was unable to complete due to edema.  Diet was slowly advanced as tolerated.  She was transitioned to oral antibiotics.  Dietitian was consulted regarding low residue/low fiber diet.  IR drain continued to be feculent/purulent drainage but 30-23mL/24hr.  On HD #6, the patient was voiding well without a foley, tolerating diet, ambulating well, pain well controlled, vital signs stable,  and felt stable for discharge home.  Patient will follow up in our office in 2 weeks and knows to call with questions or concerns.  She will call to confirm appointment date/time with Dr. Hassell Done.  HH arranged for drain management.   Physical Exam: General:  Alert, NAD, pleasant,  comfortable Abd:  Soft, ND, NT, drain with feculent purulent drainage      Medication List    STOP taking these medications        Na Sulfate-K Sulfate-Mg Sulf Soln      TAKE these medications        ciprofloxacin  500 MG tablet  Commonly known as:  CIPRO  Take 1 tablet (500 mg total) by mouth 2 (two) times daily.     DULoxetine 60 MG capsule  Commonly known as:  CYMBALTA  Take 60 mg by mouth daily.     fluconazole 200 MG tablet  Commonly known as:  DIFLUCAN  Take 1 tablet (200 mg total) by mouth daily.     metroNIDAZOLE 500 MG tablet  Commonly known as:  FLAGYL  Take 1 tablet (500 mg total) by mouth every 8 (eight) hours.     saccharomyces boulardii 250 MG capsule  Commonly known as:  FLORASTOR  Take 1 capsule (250 mg total) by mouth 2 (two) times daily.         Follow-up Information    Follow up with [facility].   Why:  drain care   Contact information:   [address] [place] [address] [phone]       Call Pedro Earls, MD.   Specialty:  General Surgery   Why:  For post-hospital follow up with the surgeon regarding your intra-abdominal infection/abscess.   Contact information:   [place] [address] [phone]       Call Scarlette Shorts, MD.   Specialty:  Gastroenterology   Why:  As needed   Contact information:   [place] [address] [phone]       Signed: Nat Christen, [facility] [phone]  06/09/2015, 11:54 AM

## 2015-06-09 NOTE — Progress Notes (Signed)
Patient ID: [name], female   DOB: 1960-09-29, 54 y.o.   MRN: [id number]    Progress Note   Subjective  Pt feeling better, hoping for discharge today.. Diarrhea improved, no further vaginal drainage. She has mild discomfort in pelvis.  Abscess drain with purulent creamy light green material .   Objective   Vital signs in last 24 hours: Temp:  [98.1 F (36.7 C)-98.5 F (36.9 C)] 98.1 F (36.7 C) (09/19 0552) Pulse Rate:  [73-82] 73 (09/19 0552) Resp:  [16-17] 17 (09/19 0552) BP: (117-140)/(61-70) 140/68 mmHg (09/19 0552) SpO2:  [98 %-99 %] 98 % (09/19 0552) Last BM Date: 06/09/15 General:    white female in NAD Heart:  Regular rate and rhythm; no murmurs Lungs: Respirations even and unlabored, lungs CTA bilaterally Abdomen:  Soft, mildly tender pelvis and nondistended. Normal bowel sounds.Drain in Right pelvis Extremities:  Without edema. Neurologic:  Alert and oriented,  grossly normal neurologically. Psych:  Cooperative. Normal mood and affect.  Intake/Output from previous day: 09/18 0701 - 09/19 0700 In: 410.7 [P.O.:120; I.V.:280.7] Out: 1600 [Urine:1550; Drains:50] Intake/Output this shift:    Lab Results:  Recent Labs  06/07/15 0535  WBC 9.7  HGB 11.6*  HCT 35.8*  PLT 575*   BMET No results for input(s): NA, K, CL, CO2, GLUCOSE, BUN, CREATININE, CALCIUM in the last 72 hours. LFT No results for input(s): PROT, ALBUMIN, AST, ALT, ALKPHOS, BILITOT, BILIDIR, IBILI in the last 72 hours. PT/INR No results for input(s): LABPROT, INR in the last 72 hours.     Assessment / Plan:    #1 54 yo female with severe distal colon inflammatory process with perforation and multiple abscesses along sigmoid and bladder dome, and colovesical  and colovaginal fistulas, and a right adnexal fluid collection measuring 8 cm ? Ovary vs infection She is stable and improved, has drain in bladder dome abscess-plan is for injection later this week to reassess  She is on Po Cipro and  po flagyl- will defer to surgery duration of abx- would anticipate at least 3 week course ? Does she need follow up CT prior to discharge to assess other abscesses   From a GI standpoint- it is not clear whether this is all from perforated diverticulitis or underlying IBD- however either way she needs surgery for resection of diseased segment and repair of bladder etc. Looking in colon prior to surgery is not going to change management , as no therapy other than antibiotics could be offered in this setting. Flex /colon also increased risk for further complications. If she requires a staged surgery colonoscopy could be done  post op at some point.  Management is surgical and defer to surgery timing of surgery. We are available- Dr. Hilarie Fredrickson available this week if surgery would like to discuss  Active Problems:   Perforated sigmoid colon   Malnutrition of moderate degree   Pelvic abscess in female   Colovaginal fistula   Colovesical fistula   Palliative care encounter   DNR (do not resuscitate)   Large bowel perforation     LOS: 6 days   Amy Esterwood  06/09/2015, 9:54 AM

## 2015-06-09 NOTE — Care Management Note (Signed)
Case Management Note  Patient Details  Name: [name] MRN: [id number] Date of Birth: [birth year]  Subjective/Objective:         Admitted with intra-abd abscess, drain placed by IR            Action/Plan: Discharge planning, spoke with patient at bedside. Planning for d/c today with drain care/flushes. No preference for [name] agency, lives in [place]. Contacted [facility] for referral, provided address of [address] [name] where patient will be staying.   Expected Discharge Date:   (unknown)               Expected Discharge Plan:  [place]  In-House Referral:  NA  Discharge planning Services  CM Consult  Post Acute Care Choice:  Home Health Choice offered to:  Patient  DME Arranged:  N/A DME Agency:  NA  HH Arranged:  RN, Disease Management [place] Agency:  [facility]  Status of Service:  Completed, signed off  Medicare Important Message Given:    Date Medicare IM Given:    Medicare IM give by:    Date Additional Medicare IM Given:    Additional Medicare Important Message give by:     If discussed at [place] of Stay Meetings, dates discussed:    Additional Comments:  Guadalupe Maple, RN 06/09/2015, 10:24 AM

## 2015-06-09 NOTE — Progress Notes (Signed)
Patient ID: [name], female   DOB: 06/28/1961, 54 y.o.   MRN: [id number]    Referring Physician(s): [facility]  Chief Complaint:  Pelvic abscess  Subjective:  Patient doing well; anxious to go home; no significant abdominal pain, nausea or vomiting.  Allergies: Review of patient's allergies indicates no known allergies.  Medications: Prior to Admission medications   Medication Sig Start Date End Date Taking? Authorizing Provider  DULoxetine (CYMBALTA) 60 MG capsule Take 60 mg by mouth daily.   Yes Historical Provider, MD  ciprofloxacin (CIPRO) 500 MG tablet Take 1 tablet (500 mg total) by mouth 2 (two) times daily. 06/09/15   Nat Christen, PA-C  fluconazole (DIFLUCAN) 200 MG tablet Take 1 tablet (200 mg total) by mouth daily. 06/09/15   Nat Christen, PA-C  metroNIDAZOLE (FLAGYL) 500 MG tablet Take 1 tablet (500 mg total) by mouth every 8 (eight) hours. 06/09/15   Nat Christen, PA-C  Na Sulfate-K Sulfate-Mg Sulf SOLN Please take as directed for colonoscopy 05/30/15   Lori P Hvozdovic, PA-C  saccharomyces boulardii (FLORASTOR) 250 MG capsule Take 1 capsule (250 mg total) by mouth 2 (two) times daily. 06/09/15   Nat Christen, PA-C     Vital Signs: BP 140/68 mmHg  Pulse 73  Temp(Src) 98.1 F (36.7 C) (Oral)  Resp 17  Ht 5\' 1"  (1.549 m)  Wt 133 lb (60.328 kg)  BMI 25.14 kg/m2  SpO2 98%  Physical Exam anterior pelvic drain intact, insertion site clean and dry, minimal tenderness to palpation, output 50 mL of feculent appearing material  Imaging: No results found.  Labs:  CBC:  Recent Labs  06/03/15 1843 06/04/15 0448 06/06/15 0520 06/07/15 0535  WBC 17.5* 10.5 10.7* 9.7  HGB 12.4 10.5* 11.4* 11.6*  HCT 36.8 31.5* 34.4* 35.8*  PLT 579* 615* 560* 575*    COAGS:  Recent Labs  06/04/15 1211  INR 1.09    BMP:  Recent Labs  05/30/15 1614 06/03/15 1843 06/04/15 0448 06/06/15 0520  NA 128* 128* 136 141  K 3.9 3.0* 3.6 3.8  CL 88* 91* 101 109  CO2 29 27 29  26   GLUCOSE 101* 105* 120* 131*  BUN 6 <5* <5* <5*  CALCIUM 9.9 9.2 8.8* 8.8*  CREATININE 0.41 0.53 0.49 0.51  GFRNONAA  --  >60 >60 >60  GFRAA  --  >60 >60 >60    LIVER FUNCTION TESTS:  Recent Labs  05/23/15 0824 05/28/15 0948 05/30/15 1614 06/03/15 1843  BILITOT 0.5 0.4 0.4 0.2*  AST 11 10 9 24   ALT 10 7 6  11*  ALKPHOS 81 64 63 52  PROT 7.1 6.8 7.3 6.7  ALBUMIN 3.3* 3.1* 3.3* 3.1*    Assessment and Plan: Patient with history of nonspecific colitis and colovesical/colovaginal fistulas; status post drainage of an abscess superior to the bladder on 9/14; patient afebrile,recent WBC on 9/17 normal; fluid cultures with few candida; recommend continuation of current drain; JP bulb exchanged out for gravity bag ;upon discharge drain will need to be flushed once daily, output recorded and dressing changes as needed. Patient will be scheduled for follow-up in drain clinic [phone]) next week.   Signed: D. Rowe Robert 06/09/2015, 1:52 PM   I spent a total of 15 minutes at the the patient's bedside AND on the patient's hospital floor or unit, greater than 50% of which was counseling/coordinating care for pelvic abscess drain

## 2015-06-09 NOTE — Plan of Care (Signed)
Problem: Food- and Nutrition-Related Knowledge Deficit (NB-1.1) Goal: Nutrition education Formal process to instruct or train a patient/client in a skill or to impart knowledge to help patients/clients voluntarily manage or modify food choices and eating behavior to maintain or improve health. Outcome: Completed/Met Date Met:  06/09/15 Nutrition Education Note  RD consulted for nutrition education regarding a low-fiber diet.  RD provided "Low Fiber Nutrition Therapy" handout from the Academy of [employer]etics. Provided examples on ways to decrease fiber intake in diet. Reviewed low and high fiber foods with patient. Encouraged use of probiotic and multi-vitamin. Teach back method used.  Expect good compliance.  Body mass index is 25.14 kg/(m^2). Pt meets criteria for overweight based on current BMI.  Current diet order is soft, patient is consuming approximately 100% of meals at this time. Labs and medications reviewed. No further nutrition interventions warranted at this time. If additional nutrition issues arise, please re-consult RD.  Clayton Bibles, MS, RD, LDN Pager: [phone] After Hours Pager: [phone]

## 2015-06-10 NOTE — Progress Notes (Signed)
Pt's vitals WNL, tolerating diet and had no complaints of pain. Discussed discharge instructions with patient. Had no questions nor concerns. Discharged to home.

## 2015-06-13 ENCOUNTER — Ambulatory Visit: Payer: BLUE CROSS/BLUE SHIELD | Admitting: Physician Assistant

## 2015-06-13 ENCOUNTER — Telehealth: Payer: Self-pay | Admitting: Physician Assistant

## 2015-06-13 NOTE — Telephone Encounter (Signed)
Patient states she was discharged from the hospital and thinks she was to have procedures set up. Please, advise.

## 2015-06-13 NOTE — Telephone Encounter (Signed)
Spoke with patient and gave her recommendations. She is going to call [facility] and schedule OV.

## 2015-06-13 NOTE — Telephone Encounter (Signed)
Pt does not need any procedures set up with [place]. She needs to see Dr Kaylyn Lim soon for surgical follow up  As he will need to schedule her surgery on bowel- any colonoscopy etc would be deferred until after she has surgery.Be sure she has an appt with Dr Hassell Done within next week or two.

## 2015-06-16 ENCOUNTER — Other Ambulatory Visit: Payer: Self-pay | Admitting: Surgery

## 2015-06-16 ENCOUNTER — Other Ambulatory Visit: Payer: Self-pay | Admitting: Radiology

## 2015-06-16 ENCOUNTER — Other Ambulatory Visit (HOSPITAL_COMMUNITY): Payer: Self-pay | Admitting: Interventional Radiology

## 2015-06-16 DIAGNOSIS — N739 Female pelvic inflammatory disease, unspecified: Secondary | ICD-10-CM

## 2015-06-17 ENCOUNTER — Ambulatory Visit
Admission: RE | Admit: 2015-06-17 | Discharge: 2015-06-17 | Disposition: A | Discharge status: Home / Self Care | Payer: BLUE CROSS/BLUE SHIELD | Source: Ambulatory Visit | Attending: Interventional Radiology | Admitting: Interventional Radiology

## 2015-06-17 ENCOUNTER — Ambulatory Visit
Admission: RE | Admit: 2015-06-17 | Discharge: 2015-06-17 | Disposition: A | Discharge status: Home / Self Care | Payer: BLUE CROSS/BLUE SHIELD | Source: Ambulatory Visit | Attending: Surgery | Admitting: Surgery

## 2015-06-17 ENCOUNTER — Other Ambulatory Visit: Payer: Self-pay | Admitting: Radiology

## 2015-06-17 DIAGNOSIS — N739 Female pelvic inflammatory disease, unspecified: Secondary | ICD-10-CM

## 2015-06-17 DIAGNOSIS — K5720 Diverticulitis of large intestine with perforation and abscess without bleeding: Secondary | ICD-10-CM

## 2015-06-17 MED ORDER — IOPAMIDOL (ISOVUE-300) INJECTION 61%
100.0000 mL | Freq: Once | INTRAVENOUS | Status: AC | PRN
  Administered 2015-06-17: 100 mL via INTRAVENOUS

## 2015-06-17 NOTE — Progress Notes (Signed)
Patient ID: [name], female   DOB: Apr 17, 1961, 54 y.o.   MRN: [id number]       Chief Complaint: Outpatient follow-up for a complex pelvic diverticular abscess. Status post percutaneous drainage.  Referring Physician(s): Hassell Done, matt  History of Present Illness: [name] is a 54 y.o. female status post percutaneous drainage of a pelvic diverticular abscess 06/04/2015. She was discharged a few days after drainage from the hospital. She returns for outpatient follow-up. She has improved clinically. No current abdominal or flank pain. No fevers. Minimal drain catheter output. Repeat CT today demonstrates near-complete resolution of the small collection drained by the catheter over the bladder dome. However, there is a second right posterior pelvic abscess which is not draining by the current catheter. She returns for outpatient follow-up and management.  Past Medical History  Diagnosis Date  . Arthritis   . Depression     pt states when her husband passed away. now feels fine.  . Melanoma     Chest     Past Surgical History  Procedure Laterality Date  . Abdominal hysterectomy    . Tonsillectomy    . Back surgery    . Melanoma excision    . Flexible sigmoidoscopy N/A 06/05/2015    Procedure: FLEXIBLE SIGMOIDOSCOPY;  Surgeon: Manus Gunning, MD;  Location: [facility];  Service: Gastroenterology;  Laterality: N/A;    Allergies: Review of patient's allergies indicates no known allergies.  Medications: Prior to Admission medications   Medication Sig Start Date End Date Taking? Authorizing Provider  ciprofloxacin (CIPRO) 500 MG tablet Take 1 tablet (500 mg total) by mouth 2 (two) times daily. 06/09/15   Nat Christen, PA-C  DULoxetine (CYMBALTA) 60 MG capsule Take 60 mg by mouth daily.    Historical Provider, MD  fluconazole (DIFLUCAN) 200 MG tablet Take 1 tablet (200 mg total) by mouth daily. 06/09/15   Nat Christen, PA-C  HYDROcodone-acetaminophen (NORCO/VICODIN)  5-325 MG per tablet Take 1 tablet by mouth every 6 (six) hours as needed for moderate pain. 06/09/15   Mickeal Skinner, MD  metroNIDAZOLE (FLAGYL) 500 MG tablet Take 1 tablet (500 mg total) by mouth every 8 (eight) hours. 06/09/15   Nat Christen, PA-C  saccharomyces boulardii (FLORASTOR) 250 MG capsule Take 1 capsule (250 mg total) by mouth 2 (two) times daily. 06/09/15   Nat Christen, PA-C     Family History  Problem Relation Age of Onset  . Hypertension Father     Social History   Social History  . Marital Status: Widowed    Spouse Name: N/A  . Number of Children: N/A  . Years of Education: N/A   Social History Main Topics  . Smoking status: Current Every Day Smoker -- 1.00 packs/day for 30 years  . Smokeless tobacco: Never Used     Comment: Pt info given 05-30-2015  . Alcohol Use: 0.0 oz/week    0 Standard drinks or equivalent per week     Comment: 2 glasses of wine a week.  . Drug Use: No  . Sexual Activity: No   Other Topics Concern  . Not on file   Social History Narrative      Review of Systems: A 12 point ROS discussed and pertinent positives are indicated in the HPI above.  All other systems are negative.  Review of Systems  Constitutional: Negative for fever, diaphoresis, activity change, appetite change and fatigue.  Respiratory: Negative for shortness of breath.   Cardiovascular: Negative for  chest pain.  Gastrointestinal: Negative for abdominal pain and abdominal distention.    Vital Signs: BP 127/75 mmHg  Pulse 101  Temp(Src) 97 F (36.1 C) (Oral)  SpO2 97%  Physical Exam  Constitutional: She appears well-developed and well-nourished. No distress.  Abdominal: Soft. Bowel sounds are normal. She exhibits no distension and no mass. There is no tenderness.  Anterior pelvic drain catheter site is clean, dry and intact.  Skin: She is not diaphoretic.    Imaging: Dg Abd 1 View  05/28/2015   CLINICAL DATA:  One month history of diarrhea, leaking of  stool this past weekend  EXAM: ABDOMEN - 1 VIEW  COMPARISON:  None in PACs  FINDINGS: There is a moderate amount of gas throughout the colon. There is a moderate stool burden as well. There is no evidence of a fecal impaction however. No evidence of small-bowel obstruction is a apparent. No free extraluminal gas collections are demonstrated. There are degenerative changes of the lower lumbar discs. There is degenerative change of the left hip.  IMPRESSION: The bowel gas pattern is not clearly abnormal. There may be mildly increased colonic stool burden but there is no evidence of fecal impaction.   Electronically Signed   By: David  Martinique M.D.   On: 05/28/2015 10:43   Ct Abdomen Pelvis W Contrast  06/03/2015   CLINICAL DATA:  Patient with 2 weeks of diarrhea, lethargy and nausea/ vomiting. Low pelvic pain. Weight loss. Elevated white blood cell count. Stool coming from urethra.  EXAM: CT ABDOMEN AND PELVIS WITH CONTRAST  TECHNIQUE: Multidetector CT imaging of the abdomen and pelvis was performed using the standard protocol following bolus administration of intravenous contrast.  CONTRAST:  159mL OMNIPAQUE IOHEXOL 300 MG/ML  SOLN  COMPARISON:  Abdominal radiographs 05/28/2015  FINDINGS: Lower chest: Normal heart size. No consolidative or nodular pulmonary opacities.  Hepatobiliary: Fatty deposition adjacent to the falciform ligament. Additional adjacent sub cm low-attenuation lesion within the left hepatic lobe too small to characterize however likely represents a small cyst. Gallbladder is decompressed. No intrahepatic or extrahepatic biliary ductal dilatation.  Pancreas: Unremarkable  Spleen: Unremarkable  Adrenals/Urinary Tract: The adrenal glands are normal. Multiple parapelvic cyst within the left renal hilum. Kidneys enhance symmetrically with contrast. There is no hydronephrosis. Wall thickening of the urinary bladder which is decompressed. There is a small amount of gas anteriorly within the urinary  bladder.  Stomach/Bowel: There is circumferential wall thickening and hyperenhancement of sigmoid colon and rectum. Multiple sigmoid colonic diverticula. There is an irregular rim enhancing fluid and gas containing collection along the right aspect of the sigmoid colon with one component measuring 6.5 x 2.7 cm (image 66; series 2). More cranially there is predominantly fat tissue stranding with an additional associated fluid collection measuring 2.7 x 2.7 cm along the bladder dome (image 71; series 2). There is a fistulous connection from this overlying abscess into urinary bladder best demonstrated on image 54; series 602. Within the left hemipelvis there is a 9 x 6 cm fluid collection (image 63; series 2) which appears to represent an adnexal cyst, likely ovarian in etiology.  Vascular/Lymphatic: No retroperitoneal lymphadenopathy. Aorta is normal in caliber. Peripheral calcified atherosclerotic plaque.  Other: Small amount a gas is demonstrated non dependently within the urinary bladder. Additionally small amount of gas is demonstrated within the vagina.  Musculoskeletal: No aggressive or acute appearing osseous lesions. Lower lumbar spine degenerative changes.  IMPRESSION: The sigmoid colon rectum is markedly thick walled most compatible with severe  colitis and/or diverticulitis, which has perforated. Along the right aspect of sigmoid colon there are multiple rim enhancing fluid and gas containing collections most compatible with abscesses. There is an abscess along the bladder dome with an adjacent small focus of gas within the bladder lumen and significant wall thickening of the adjacent bladder wall, compatible with colovesical fistula. 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  There is an 8 cm fluid collection within the left adnexa likely ovarian in etiology. This may represent a large ovarian cyst however superimposed infection is not excluded.  These results were  called by telephone at the time of interpretation on 06/03/2015 at 3:13 pm to Dr. Cecille Rubin [facility] , who verbally acknowledged these results.   Electronically Signed   By: Lovey Newcomer M.D.   On: 06/03/2015 15:20   Dg Sinus/fist Tube Chk-non Gi  06/17/2015   CLINICAL DATA:  Pelvic diverticular abscess with fistula to the colon and the bladder. Outpatient follow-up. Subsequent encounter.  EXAM: ABSCESS INJECTION  CONTRAST:  20 cc Omnipaque 300  FLUOROSCOPY TIME:  Radiation Exposure Index (as provided by the fluoroscopic device): 166 dGy  If the device does not provide the exposure index:  Fluoroscopy Time (in minutes and seconds):  30 second  Number of Acquired Images:  8  COMPARISON:  06/17/2015  FINDINGS: Under sterile conditions, the existing anterior pelvic abscess drain was injected with contrast. Fluoroscopic imaging performed. Small residual collapsed abscess cavity about the drain catheter. Small fistulous connections present to the adjacent lower sigmoid colon. There are additional small fistulous tracts to the bladder. There is also contrast leakage from the colon into a more posterior pelvic abscess consistent with continued colonic perforation.  IMPRESSION: Positive exam for fistulous tract communication from the collapsed abscess cavity to the adjacent colon as well as the adjacent bladder.  Leakage of contrast within the posterior pelvic abscess cavity from the colon compatible with continued colonic perforation. This is not adequately drained by the current drain catheter position.  This posterior abscess cavity now appears amenable to CT-guided drainage which is scheduled as an outpatient tomorrow at [facility].   Electronically Signed   By: Jerilynn Mages.  Shick M.D.   On: 06/17/2015 15:20   Ct Image Guided Drainage By Percutaneous Catheter  06/04/2015   CLINICAL DATA:  54 year old female with nonspecific colitis and colovesicular and colovaginal fistulous. Additionally, there is relatively small  volume abscess in the perirectal space and superior to the bladder.  EXAM: CT IMAGE GUIDED DRAINAGE BY PERCUTANEOUS CATHETER  Date: 06/04/2015  PROCEDURE: 1. CT-guided drain placement Interventional Radiologist:  Criselda Peaches, MD  ANESTHESIA/SEDATION: Moderate (conscious) sedation was used. 2.5 mg Versed, 75 mcg Fentanyl were administered intravenously. The patient's vital signs were monitored continuously by radiology nursing throughout the procedure.  Sedation Time: 16 minutes  MEDICATIONS: None additional  TECHNIQUE: Informed consent was obtained from the patient following explanation of the procedure, risks, benefits and alternatives. The patient understands, agrees and consents for the procedure. All questions were addressed. A time out was performed.  An initial limited barium enema was performed. The patient was then placed prone and initial axial CT imaging performed. Contrast material opacifies the redundant rectosigmoid colon. There is no focal fluid or gas collection in the perirectal space. The previously noted collections are apparently mobile and have migrated more anteriorly. There is no safe window for access from a trans gluteal approach. The large cystic structure emanating from the left ovary is  again noted. Contrast material is noted collecting anterior to the bladder consistent with extravasation of the administered contrast agent and colovesicular fistula.  Therefore, the patient was maneuvered into the supine position. Axial CT imaging was performed. A small fluid and gas collection is noted superior and right lateral to the bladder dome. This is consistent with the findings on the recent CT scan. An appropriate skin entry site was selected and marked. The region was sterilely prepped and draped in standard fashion using Betadine skin prep. Local anesthesia was attained by infiltration with 1% lidocaine. A small dermatotomy was made. Under intermittent CT fluoroscopic guidance, an 18  gauge trocar needle was advanced through the right rectus abdominis muscle and into the fluid and gas collection. An Amplatz wire was coiled within the collection. This tract was then dilated to 10 [place] and a Cook 10.2 [place] all-purpose drainage catheter advanced over the wire and formed with the locking loop centered in the small fluid and gas collection. Aspiration yields approximately 7 mL thick purulent material. This was sent for culture.  The drainage catheter was secured to the skin with 0 Prolene suture. A sterile bandage was applied. The patient tolerated the procedure well.  COMPLICATIONS: None  Estimated blood loss:  0  IMPRESSION: 1. There is no safe trans gluteal approach for abscess drainage. 2. Successful placement of an anterior approach drainage catheter into the abscess collection superior to the dome of the bladder in the presumed location of the colovesicular fistula. Aspiration yielded approximately 7 mL thick purulent material which was sent for culture.  PLAN: 1. Maintain drain to JP bulb suction for at least the first 48-72 hours. After that time, conversion to gravity bag drainage can be considered. 2. Given suspected colovesicular fistula, recommend drain injection under fluoroscopy prior to consideration of drain removal. It is likely that this drain will need to be maintained in place for a prolonged period of time.  Signed,  Criselda Peaches, MD  [facility]  [facility]   Electronically Signed   By: Jacqulynn Cadet M.D.   On: 06/04/2015 17:10    Labs:  CBC:  Recent Labs  06/03/15 1843 06/04/15 0448 06/06/15 0520 06/07/15 0535  WBC 17.5* 10.5 10.7* 9.7  HGB 12.4 10.5* 11.4* 11.6*  HCT 36.8 31.5* 34.4* 35.8*  PLT 579* 615* 560* 575*    COAGS:  Recent Labs  06/04/15 1211  INR 1.09    BMP:  Recent Labs  05/30/15 1614 06/03/15 1843 06/04/15 0448 06/06/15 0520  NA 128* 128* 136 141  K 3.9 3.0* 3.6 3.8    CL 88* 91* 101 109  CO2 29 27 29 26   GLUCOSE 101* 105* 120* 131*  BUN 6 <5* <5* <5*  CALCIUM 9.9 9.2 8.8* 8.8*  CREATININE 0.41 0.53 0.49 0.51  GFRNONAA  --  >60 >60 >60  GFRAA  --  >60 >60 >60    LIVER FUNCTION TESTS:  Recent Labs  05/23/15 0824 05/28/15 0948 05/30/15 1614 06/03/15 1843  BILITOT 0.5 0.4 0.4 0.2*  AST 11 10 9 24   ALT 10 7 6  11*  ALKPHOS 81 64 63 52  PROT 7.1 6.8 7.3 6.7  ALBUMIN 3.3* 3.1* 3.3* 3.1*     Assessment and Plan:  Status post percutaneous drainage of an anterior Complex pelvic diverticular abscess with adjacent fistulas to the rectosigmoid colon and the bladder.  Persistent posterior right pelvic undrained abscess containing contrast compatible with a continued colonic leak/perforation. This collection  appears amenable to placement of a second abscess drain. This has been scheduled at [facility] for tomorrow.  Findings discussed with Dr. Hassell Done.  Thank you for this interesting consult.  I greatly enjoyed meeting Doreatha Offer and look forward to participating in their care.  A copy of this report was sent to the requesting provider on this date.  SignedGreggory Keen 06/17/2015, 3:44 PM   I spent a total of  30 Minutes  in face to face in clinical consultation, greater than 50% of which was counseling/coordinating care for this patient with a pelvic diverticular abscess and colovesical fistula.

## 2015-06-18 ENCOUNTER — Ambulatory Visit (HOSPITAL_COMMUNITY)
Admission: RE | Admit: 2015-06-18 | Discharge: 2015-06-18 | Disposition: A | Discharge status: Home / Self Care | Payer: BLUE CROSS/BLUE SHIELD | Source: Ambulatory Visit | Attending: Interventional Radiology | Admitting: Interventional Radiology

## 2015-06-18 ENCOUNTER — Other Ambulatory Visit: Payer: BLUE CROSS/BLUE SHIELD

## 2015-06-18 DIAGNOSIS — K5720 Diverticulitis of large intestine with perforation and abscess without bleeding: Secondary | ICD-10-CM | POA: Diagnosis present

## 2015-06-18 LAB — CBC WITH DIFFERENTIAL/PLATELET
Basophils Absolute: 0.1 10*3/uL (ref 0.0–0.1)
Basophils Relative: 1 %
Eosinophils Absolute: 0.3 10*3/uL (ref 0.0–0.7)
Eosinophils Relative: 3 %
HEMATOCRIT: 35 % — AB (ref 36.0–46.0)
HEMOGLOBIN: 11.8 g/dL — AB (ref 12.0–15.0)
LYMPHS ABS: 2.5 10*3/uL (ref 0.7–4.0)
Lymphocytes Relative: 25 %
MCH: 30.8 pg (ref 26.0–34.0)
MCHC: 33.7 g/dL (ref 30.0–36.0)
MCV: 91.4 fL (ref 78.0–100.0)
MONOS PCT: 13 %
Monocytes Absolute: 1.3 10*3/uL — ABNORMAL HIGH (ref 0.1–1.0)
NEUTROS ABS: 5.8 10*3/uL (ref 1.7–7.7)
NEUTROS PCT: 58 %
Platelets: 714 10*3/uL — ABNORMAL HIGH (ref 150–400)
RBC: 3.83 MIL/uL — ABNORMAL LOW (ref 3.87–5.11)
RDW: 14.1 % (ref 11.5–15.5)
WBC: 10 10*3/uL (ref 4.0–10.5)

## 2015-06-18 LAB — COMPREHENSIVE METABOLIC PANEL
ALK PHOS: 66 U/L (ref 38–126)
ALT: 7 U/L — ABNORMAL LOW (ref 14–54)
ANION GAP: 10 (ref 5–15)
AST: 16 U/L (ref 15–41)
Albumin: 3.1 g/dL — ABNORMAL LOW (ref 3.5–5.0)
BILIRUBIN TOTAL: 0.3 mg/dL (ref 0.3–1.2)
BUN: <5 mg/dL — ABNORMAL LOW (ref 6–20)
CALCIUM: 9.3 mg/dL (ref 8.9–10.3)
CO2: 26 mmol/L (ref 22–32)
Chloride: 100 mmol/L — ABNORMAL LOW (ref 101–111)
Creatinine, Ser: 0.61 mg/dL (ref 0.44–1.00)
GLUCOSE: 109 mg/dL — AB (ref 65–99)
Potassium: 3.3 mmol/L — ABNORMAL LOW (ref 3.5–5.1)
Sodium: 136 mmol/L (ref 135–145)
TOTAL PROTEIN: 6.9 g/dL (ref 6.5–8.1)

## 2015-06-18 MED ORDER — HYDROCODONE-ACETAMINOPHEN 5-325 MG PO TABS
1.0000 | ORAL_TABLET | ORAL | Status: DC | PRN
  Administered 2015-06-18: 1 via ORAL
  Filled 2015-06-18: qty 1

## 2015-06-18 MED ORDER — MIDAZOLAM HCL 2 MG/2ML IJ SOLN
INTRAMUSCULAR | Status: AC | PRN
  Administered 2015-06-18 (×2): 1 mg via INTRAVENOUS

## 2015-06-18 MED ORDER — SODIUM CHLORIDE 0.9 % IV SOLN
INTRAVENOUS | Status: DC
  Administered 2015-06-18: 12:00:00 via INTRAVENOUS

## 2015-06-18 MED ORDER — MIDAZOLAM HCL 2 MG/2ML IJ SOLN
INTRAMUSCULAR | Status: AC
  Filled 2015-06-18: qty 6

## 2015-06-18 MED ORDER — FENTANYL CITRATE (PF) 100 MCG/2ML IJ SOLN
INTRAMUSCULAR | Status: AC
  Filled 2015-06-18: qty 4

## 2015-06-18 MED ORDER — FENTANYL CITRATE (PF) 100 MCG/2ML IJ SOLN
INTRAMUSCULAR | Status: AC | PRN
  Administered 2015-06-18: 50 ug via INTRAVENOUS

## 2015-06-18 NOTE — Progress Notes (Signed)
Patient ID: [name], female   DOB: 1960-10-08, 54 y.o.   MRN: [id number]    Referring Physician(s): Martin,M  Chief Complaint: Pelvic abscesses   Subjective:  Pt familiar to IR service from recent drainage of pelvic abscess anterior to bladder on 06/04/15. Pt was seen in IR clinic 9/27 and f/u CT revealed stable appearance of drained collection but persistent posterior pelvic air-fluid collection which appeared amenable to drainage. She presents today for CT guided drainage of this posterior pelvic abscess. She is currently on Cipro and denies fevers, chills, HA,CP,dyspnea, cough, N/V or abnormal bleeding. She cont to have some lower pelvic/back pain and vaginal drainage.   Allergies: Review of patient's allergies indicates no known allergies.  Medications: Prior to Admission medications   Medication Sig Start Date End Date Taking? Authorizing Provider  ciprofloxacin (CIPRO) 500 MG tablet Take 1 tablet (500 mg total) by mouth 2 (two) times daily. 06/09/15  Yes Nat Christen, PA-C  DULoxetine (CYMBALTA) 60 MG capsule Take 60 mg by mouth daily.   Yes Historical Provider, MD  HYDROcodone-acetaminophen (NORCO/VICODIN) 5-325 MG per tablet Take 1 tablet by mouth every 6 (six) hours as needed for moderate pain. 06/09/15  Yes Arta Bruce Kinsinger, MD  metroNIDAZOLE (FLAGYL) 500 MG tablet Take 1 tablet (500 mg total) by mouth every 8 (eight) hours. 06/09/15  Yes Nat Christen, PA-C  Prenatal Vit-Fe Fumarate-FA (PRENATAL MULTIVITAMIN) TABS tablet Take 1 tablet by mouth daily at 12 noon.   Yes Historical Provider, MD  saccharomyces boulardii (FLORASTOR) 250 MG capsule Take 1 capsule (250 mg total) by mouth 2 (two) times daily. 06/09/15  Yes Nat Christen, PA-C  fluconazole (DIFLUCAN) 200 MG tablet Take 1 tablet (200 mg total) by mouth daily. 06/09/15   Nat Christen, PA-C     Vital Signs: BP 123/76 mmHg  Pulse 75  Temp(Src) 97.1 F (36.2 C) (Oral)  Resp 20  Wt 128 lb (58.06 kg)  SpO2  94%  Physical Exam pt awake/alert; chest- few exp wheezes, distant BS bilat, bony/ST prominence at chest /sternum region,NT- hx remote melanoma resection  ; heart- RRR; abd- soft,+BS, clean, intact midline pelvic drain with small amt greenish brown fluid in bag, mild pelvic tenderness to palpation  Imaging: Ct Abdomen Pelvis W Contrast  06/17/2015   CLINICAL DATA:  Pelvic diverticular abscess, status post percutaneous drainage 06/04/2015. Evidence of colovesical fistula clinically. Outpatient follow-up.  EXAM: CT ABDOMEN AND PELVIS WITH CONTRAST  TECHNIQUE: Multidetector CT imaging of the abdomen and pelvis was performed using the standard protocol following bolus administration of intravenous contrast.  CONTRAST:  128mL ISOVUE-300 IOPAMIDOL (ISOVUE-300) INJECTION 61%  COMPARISON:  06/03/2015, 06/04/2015  FINDINGS: Lower chest: Clear lung bases. Normal heart size. No pericardial or pleural effusion. No hiatal hernia. Atherosclerosis of the lower thoracic aorta.  Abdomen: Liver demonstrates focal fatty infiltration along the falciform ligament. Small hepatic cyst measuring 7 mm in the left hepatic lobe anteriorly. No other hepatic abnormality. Patent hepatic and portal veins. No biliary dilatation. Gallbladder, biliary system, pancreas, spleen, adrenal glands, and kidneys are within normal limits for age and demonstrate no acute process. Left kidney again demonstrates an incidental parapelvic cyst measuring 2 cm. No renal obstruction or hydronephrosis. Normal excretion.  No abdominal free fluid, fluid collection, hemorrhage, abscess or adenopathy.  Negative for bowel obstruction, dilatation, ileus, or free air.  Abdominal atherosclerosis noted without aneurysm, occlusive process, or dissection.  Pelvis: Normal appendix in the right lower quadrant. Stable large left adnexal ovarian cyst  measures 8 x 6 cm, image 61. Anterior lower pelvic abscess drain is in stable position over the bladder dome. Small amount of  residual air fluid about the drain catheter position. Interval improvement in the lower sigmoid and rectum diverticulitis however there is residual wall thickening, enhancement and strandy edema.  Along the right aspect of the rectum, there is extra luminal air and fluid tracking posteriorly. There is an irregular heterogeneous fluid collection possibly containing dilute contrast in the right presacral area measuring 4.4 x 2.5 cm compatible with a persistent abscess. Density within the collection is suspicious for continued colonic leakage/perforation. This posterior collection is not drained by the current pelvic drain and appears amenable to placement of a second drain catheter with CT guidance.  Urinary bladder is collapsed. Iliac atherosclerosis noted. No pelvic adenopathy or inguinal abnormality. No hernia.  Degenerative changes of the spine, pelvis and hips. 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  IMPRESSION: Slight interval improvement in the rectus sigmoid diverticulitis.  Midline anterior pelvic drain catheter in stable position with improvement in the small abscess overlying the bladder dome.  Persistent posterior pelvic air-fluid collection possibly containing dilute contrast compatible with a persistent abscess and suspicious for a continued colonic perforation. This collection is not well drain by the existing abscess drain and appears amenable to placement of a CT-guided second drain catheter.   Electronically Signed   By: Jerilynn Mages.  Shick M.D.   On: 06/17/2015 15:43   Dg Sinus/fist Tube Chk-non Gi  06/17/2015   CLINICAL DATA:  Pelvic diverticular abscess with fistula to the colon and the bladder. Outpatient follow-up. Subsequent encounter.  EXAM: ABSCESS INJECTION  CONTRAST:  20 cc Omnipaque 300  FLUOROSCOPY TIME:  Radiation Exposure Index (as provided by the fluoroscopic device): 166 dGy  If the device does not provide the exposure index:  Fluoroscopy Time (in minutes and seconds):  30 second   Number of Acquired Images:  8  COMPARISON:  06/17/2015  FINDINGS: Under sterile conditions, the existing anterior pelvic abscess drain was injected with contrast. Fluoroscopic imaging performed. Small residual collapsed abscess cavity about the drain catheter. Small fistulous connections present to the adjacent lower sigmoid colon. There are additional small fistulous tracts to the bladder. There is also contrast leakage from the colon into a more posterior pelvic abscess consistent with continued colonic perforation.  IMPRESSION: Positive exam for fistulous tract communication from the collapsed abscess cavity to the adjacent colon as well as the adjacent bladder.  Leakage of contrast within the posterior pelvic abscess cavity from the colon compatible with continued colonic perforation. This is not adequately drained by the current drain catheter position.  This posterior abscess cavity now appears amenable to CT-guided drainage which is scheduled as an outpatient tomorrow at [facility]   Electronically Signed   By: Jerilynn Mages.  Shick M.D.   On: 06/17/2015 15:20    Labs:  CBC:  Recent Labs  06/04/15 0448 06/06/15 0520 06/07/15 0535 06/18/15 1108  WBC 10.5 10.7* 9.7 10.0  HGB 10.5* 11.4* 11.6* 11.8*  HCT 31.5* 34.4* 35.8* 35.0*  PLT 615* 560* 575* 714*    COAGS:  Recent Labs  06/04/15 1211  INR 1.09    BMP:  Recent Labs  05/30/15 1614 06/03/15 1843 06/04/15 0448 06/06/15 0520  NA 128* 128* 136 141  K 3.9 3.0* 3.6 3.8  CL 88* 91* 101 109  CO2 29 27 29 26   GLUCOSE 101* 105* 120* 131*  BUN 6 <5* <5* <5*  CALCIUM  9.9 9.2 8.8* 8.8*  CREATININE 0.41 0.53 0.49 0.51  GFRNONAA  --  >60 >60 >60  GFRAA  --  >60 >60 >60    LIVER FUNCTION TESTS:  Recent Labs  05/23/15 0824 05/28/15 0948 05/30/15 1614 06/03/15 1843  BILITOT 0.5 0.4 0.4 0.2*  AST 11 10 9 24   ALT 10 7 6  11*  ALKPHOS 81 64 63 52  PROT 7.1 6.8 7.3 6.7  ALBUMIN 3.3* 3.1* 3.3* 3.1*    Assessment and  Plan: Pt status post percutaneous drainage of an anterior complex pelvic diverticular abscess with adjacent fistulas to the rectosigmoid colon and the bladder 06/04/15; persistent posterior right pelvic undrained abscess containing contrast compatible with a continued colonic leak/perforation. This collection appears amenable to placement of a second abscess drain and pt presents today for CT guided drainage of the above mentioned collection.Risks and benefits discussed with the patient including bleeding, infection, damage to adjacent structures, bowel perforation/fistula connection, and sepsis.All of the patient's questions were answered, patient is agreeable to proceed.Consent signed and in chart.     Signed: D. Rowe Robert 06/18/2015, 12:15 PM   I spent a total of 15 minutes at the the patient's bedside AND on the patient's hospital floor or unit, greater than 50% of which was counseling/coordinating care for CT guided pelvic abscess drainage

## 2015-06-18 NOTE — Procedures (Signed)
Successful transgluteal pelvic abscess drain insertion No comp 20cc fecal comtaminated fluid aspirated No comp GS/CX sent Full report in PACS

## 2015-06-18 NOTE — Discharge Instructions (Signed)
Percutaneous Abscess Drain, Care After  Refer to this sheet in the next few weeks. These instructions provide you with information on caring for yourself after your procedure. Your health care provider may also give you more specific instructions. Your treatment has been planned according to current medical practices, but problems sometimes occur. Call your health care provider if you have any problems or questions after your procedure. WHAT TO EXPECT AFTER THE PROCEDURE After your procedure, it is typical to have the following:   A small amount of discomfort in the area where the drainage tube was placed.  A small amount of bruising around the area where the drainage tube was placed.  Sleepiness and fatigue for the rest of the day from the medicines used. HOME CARE INSTRUCTIONS  Rest at home for 1-2 days following your procedure or as directed by your health care provider.  If you go home right after the procedure, plan to have someone with you for 24 hours.  Do not take a bathor shower for 24 hours after your procedure.  Take medicines only as directed by your health care provider. Ask your health care provider when you can resume taking any normal medicines.  Change bandages (dressings) as directed.   You may be told to record the amount of drainage from the bag every time you empty it. Follow your health care provider's directions for emptying the bag. Write down the amount of drainage, the date, and the time you emptied it.  Call your health care provider when the drain is putting out less than 10 mL of drainage per day for 2-3 days in a row or as directed by your health care provider.  Follow your health care provider's instructions for cleaning the drainage tube. You may need to clean the tube every day so that it does not clog. SEEK MEDICAL CARE IF:  You have increased bleeding (more than a small spot) from the site where the drainage tube was placed.  You have redness,  swelling, or increasing pain around the site where the drainage tube was placed.  You notice a discharge or bad smell coming from the site where the drainage tube was placed.  You have a fever or chills.  You have pain that is not helped by medicine.  SEEK IMMEDIATE MEDICAL CARE IF:  There is leakage around the drainage tube.  The drainage tube pulls out.  You suddenly stop having drainage from the tube.  You suddenly have blood in the drainage fluid.  You become dizzy or faint.  You develop a rash.   You have nausea or vomiting.  You have difficulty breathing, feel short of breath, or feel faint.   You develop chest pain.  You have problems with your speech or vision.  You have trouble balancing or moving your arms or legs. Document Released: 01/21/2014 Document Reviewed: 10/26/2013 [facility] Patient Information 2015 [place]. This information is not intended to replace advice given to you by your health care provider. Make sure you discuss any questions you have with your health care provider. Percutaneous Abscess Drain An abscess is a collection of infected fluid inside the body. Your health care provider may decide to remove or drain the infected fluid from the area by placing a thin needle into the abscess. Usually, a small tube is left in place to drain the abscess fluid. The abscess fluid may take a few days to drain. LET [facility] CARE PROVIDER KNOW ABOUT:  Any allergies you have.  All medicines you are taking, including vitamins, herbs, eye drops, creams, and over-the-counter medicines. This includes steroid medicines by mouth or cream.  Previous problems you or members of your family have had with the use of anesthetics.  Any blood disorders you have.  Previous surgeries you have had.  Possibility of pregnancy, if this applies.  Medical conditions you have.  Any history of smoking. RISKS AND COMPLICATIONS Generally, this is a safe procedure.  However, problems can occur and include:   Infection.  Allergic reaction to materials used (such as contrast dye).  Damage to a nearby organ or tissue.  Bleeding.  Blockage of a tube placed to drain the abscess, requiring placement of a new drainage tube.  A need to repeat the procedure.  Failure of the procedure to adequately drain the abscess, requiring an open surgical procedure to do so. BEFORE THE PROCEDURE   Ask your health care provider about:  Changing or stopping your regular medicines. This is especially important if you are taking diabetes medicines or blood thinners.  Taking medicines such as aspirin and ibuprofen. These medicines can thin your blood. Do not take these medicines before your procedure if your health care provider asks you not to.  Your health care provider may do some blood or urine tests. These will help your health care provider learn how well your kidneys and liver are working and how well your blood clots.  Do not eat or drink anything after midnight on the night before the procedure or as directed by your health care provider.  Make arrangements for someone to drive you home after the procedure.  PROCEDURE   An IV tube will be placed in your arm. Medicine will be able to flow directly into your body through this tube.  You will lie on an X-ray table.  Your heart rate, blood pressure, and breathing will be monitored.   Your oxygen level will also be watched during the procedure. Supplemental oxygen may be given if necessary.  The skin around the area where the drainage tube (catheter) will be placed will be cleaned and numbed.  A small cut (incision) will then be made to insert the drainage tube. The drainage tube will be inserted using X-ray or CT scan to help direct where it should be placed.  The drainage tube will be guided into the abscess to drain the infected fluid.  The drainage tube may stay in place and be connected to a bag  outside your body. It will stay until the fluid has stopped draining and the infection is gone. AFTER THE PROCEDURE  You will be taken to a recovery area where you will stay until the medicines have worn off.  You will stay in bed for several hours.  Your progress will be monitored.   Your blood pressure and pulse will be checked often.   The area of the incision will be checked often.  You may have some pain or feel sick. Tell your health care provider.  As you begin to feel better, you may be given ice, fluids, and food.   When you can walk, drink, eat, and use the bathroom, you may be able to go home. Document Released: 01/21/2014 Document Reviewed: 10/26/2013 [facility] Patient Information 2015 [place]. This information is not intended to replace advice given to you by your health care provider. Make sure you discuss any questions you have with your health care provider. Conscious Sedation Sedation is the use of medicines to promote  relaxation and relieve discomfort and anxiety. Conscious sedation is a type of sedation. Under conscious sedation you are less alert than normal but are still able to respond to instructions or stimulation. Conscious sedation is used during short medical and dental procedures. It is milder than deep sedation or general anesthesia and allows you to return to your regular activities sooner.  LET [facility] CARE PROVIDER KNOW ABOUT:   Any allergies you have.  All medicines you are taking, including vitamins, herbs, eye drops, creams, and over-the-counter medicines.  Use of steroids (by mouth or creams).  Previous problems you or members of your family have had with the use of anesthetics.  Any blood disorders you have.  Previous surgeries you have had.  Medical conditions you have.  Possibility of pregnancy, if this applies.  Use of cigarettes, alcohol, or illegal drugs. RISKS AND COMPLICATIONS Generally, this is a safe procedure.  However, as with any procedure, problems can occur. Possible problems include:  Oversedation.  Trouble breathing on your own. You may need to have a breathing tube until you are awake and breathing on your own.  Allergic reaction to any of the medicines used for the procedure. BEFORE THE PROCEDURE  You may have blood tests done. These tests can help show how well your kidneys and liver are working. They can also show how well your blood clots.  A physical exam will be done.  Only take medicines as directed by your health care provider. You may need to stop taking medicines (such as blood thinners, aspirin, or nonsteroidal anti-inflammatory drugs) before the procedure.   Do not eat or drink at least 6 hours before the procedure or as directed by your health care provider.  Arrange for a responsible adult, family member, or friend to take you home after the procedure. He or she should stay with you for at least 24 hours after the procedure, until the medicine has worn off. PROCEDURE   An intravenous (IV) catheter will be inserted into one of your veins. Medicine will be able to flow directly into your body through this catheter. You may be given medicine through this tube to help prevent pain and help you relax.  The medical or dental procedure will be done. AFTER THE PROCEDURE  You will stay in a recovery area until the medicine has worn off. Your blood pressure and pulse will be checked.   Depending on the procedure you had, you may be allowed to go home when you can tolerate liquids and your pain is under control. Document Released: 06/01/2001 Document Revised: 09/11/2013 Document Reviewed: 05/14/2013 [facility] Patient Information 2015 [place], [place]. This information is not intended to replace advice given to you by your health care provider. Make sure you discuss any questions you have with your health care provider. Conscious Sedation, Adult, Care After Refer to this sheet in  the next few weeks. These instructions provide you with information on caring for yourself after your procedure. Your health care provider may also give you more specific instructions. Your treatment has been planned according to current medical practices, but problems sometimes occur. Call your health care provider if you have any problems or questions after your procedure. WHAT TO EXPECT AFTER THE PROCEDURE  After your procedure: You may feel sleepy, clumsy, and have poor balance for several hours. Vomiting may occur if you eat too soon after the procedure. HOME CARE INSTRUCTIONS Do not participate in any activities where you could become injured for at least 24  hours. Do not: Drive. Swim. Ride a bicycle. Operate heavy machinery. Cook. Use power tools. Climb ladders. Work from a high place. Do not make important decisions or sign legal documents until you are improved. If you vomit, drink water, juice, or soup when you can drink without vomiting. Make sure you have little or no nausea before eating solid foods. Only take over-the-counter or prescription medicines for pain, discomfort, or fever as directed by your health care provider. Make sure you and your family fully understand everything about the medicines given to you, including what side effects may occur. You should not drink alcohol, take sleeping pills, or take medicines that cause drowsiness for at least 24 hours. If you smoke, do not smoke without supervision. If you are feeling better, you may resume normal activities 24 hours after you were sedated. Keep all appointments with your health care provider. SEEK MEDICAL CARE IF: Your skin is pale or bluish in color. You continue to feel nauseous or vomit. Your pain is getting worse and is not helped by medicine. You have bleeding or swelling. You are still sleepy or feeling clumsy after 24 hours. SEEK IMMEDIATE MEDICAL CARE IF: You develop a rash. You have difficulty  breathing. You develop any type of allergic problem. You have a fever. MAKE SURE YOU: Understand these instructions. Will watch your condition. Will get help right away if you are not doing well or get worse. Document Released: 06/27/2013 Document Reviewed: 06/27/2013 [facility] Patient Information 2015 [place], [place]. This information is not intended to replace advice given to you by your health care provider. Make sure you discuss any questions you have with your health care provider.

## 2015-06-22 LAB — CULTURE, ROUTINE-ABSCESS: SPECIAL REQUESTS: NORMAL

## 2015-06-23 ENCOUNTER — Telehealth: Payer: Self-pay | Admitting: Medical

## 2015-06-23 ENCOUNTER — Telehealth: Payer: Self-pay | Admitting: Internal Medicine

## 2015-06-23 NOTE — Telephone Encounter (Signed)
Pt was instructed to follow-up with [facility]. Pt has OV scheduled with Dr. Hassell Done this Friday at 3:15pm. Will instruct pt to keep [facility] and that we will get pt back in if surgery thinks she needs to see GI. Left message for pt to call back.

## 2015-06-23 NOTE — Telephone Encounter (Signed)
Caller name: Precious Bard  Relation to pt: RN from  [facility]  Call back number: [phone]  Pharmacy:  Reason for call:  Requesting verbal orders for urinalysis and culture.

## 2015-06-23 NOTE — Telephone Encounter (Signed)
Spoke with patti at [place] and Per VO from ES ok to do a urinalysis and culture due to pt having brown urine, frequency, urgency, and low back pain.

## 2015-06-24 ENCOUNTER — Telehealth: Payer: Self-pay | Admitting: Internal Medicine

## 2015-06-24 LAB — ANAEROBIC CULTURE: Special Requests: NORMAL

## 2015-06-24 NOTE — Telephone Encounter (Signed)
Discussed with pt that she needed to keep appt with [facility].

## 2015-06-24 NOTE — Telephone Encounter (Signed)
Pt states that there is blood in her drainage tube this am. Discussed with pt that she needs to call [facility] regarding the tube. Phone number for [facility] given to pt.

## 2015-06-30 ENCOUNTER — Telehealth: Payer: Self-pay | Admitting: *Deleted

## 2015-06-30 ENCOUNTER — Telehealth: Payer: Self-pay | Admitting: Medical

## 2015-06-30 NOTE — Telephone Encounter (Signed)
Left message for Precious Bard to call back.

## 2015-06-30 NOTE — Telephone Encounter (Signed)
I got a urine culture back on [name]. It read 80,000 lactobacillus species. This is usually normal vagina flora. Most of time considered contaminant in urine culture. Usually not treated. Would you call [facility] [phone]. See what patients symptoms area presently. Usually this is not treated. If she has a lot of symptoms would consider treatment.

## 2015-06-30 NOTE — Telephone Encounter (Signed)
Spoke with patient reminding her of her appointment with Dr. Alen Blew tomorrow. Patient verbalized understanding.

## 2015-06-30 NOTE — Telephone Encounter (Signed)
Spoke with Precious Bard at [facility] and she is faxing over results on the pt for provider to review. Precious Bard is concerned that the pt has a UTI. She has not seen pt today so she is not aware of any symptoms.

## 2015-07-01 ENCOUNTER — Telehealth: Payer: Self-pay | Admitting: Oncology

## 2015-07-01 ENCOUNTER — Ambulatory Visit (HOSPITAL_BASED_OUTPATIENT_CLINIC_OR_DEPARTMENT_OTHER): Payer: BLUE CROSS/BLUE SHIELD | Admitting: Oncology

## 2015-07-01 VITALS — BP 131/71 | HR 109 | Temp 97.7°F | Resp 20 | Ht 61.0 in | Wt 128.2 lb

## 2015-07-01 DIAGNOSIS — D72829 Elevated white blood cell count, unspecified: Secondary | ICD-10-CM | POA: Diagnosis not present

## 2015-07-01 DIAGNOSIS — C439 Malignant melanoma of skin, unspecified: Secondary | ICD-10-CM

## 2015-07-01 DIAGNOSIS — D75839 Thrombocytosis, unspecified: Secondary | ICD-10-CM

## 2015-07-01 DIAGNOSIS — D473 Essential (hemorrhagic) thrombocythemia: Secondary | ICD-10-CM | POA: Diagnosis not present

## 2015-07-01 DIAGNOSIS — Z85820 Personal history of malignant melanoma of skin: Secondary | ICD-10-CM

## 2015-07-01 DIAGNOSIS — K5792 Diverticulitis of intestine, part unspecified, without perforation or abscess without bleeding: Secondary | ICD-10-CM

## 2015-07-01 DIAGNOSIS — R222 Localized swelling, mass and lump, trunk: Secondary | ICD-10-CM | POA: Diagnosis not present

## 2015-07-01 MED ORDER — AMPICILLIN 250 MG PO CAPS: 250.0000 mg | ORAL_CAPSULE | Freq: Three times a day (TID) | ORAL | Status: DC

## 2015-07-01 NOTE — Telephone Encounter (Signed)
Gave patient avs report and appointments for November. [facility] will call patient with ct scan - patient aware.

## 2015-07-01 NOTE — Telephone Encounter (Signed)
Caller name: Precious Bard Relation to pt: [facility]  Call back number: [phone]    Reason for call:  Following up on message below

## 2015-07-01 NOTE — Telephone Encounter (Signed)
[name] what would you like me to tell Precious Bard at [facility]?

## 2015-07-01 NOTE — Progress Notes (Signed)
Please see consult note.  

## 2015-07-01 NOTE — Telephone Encounter (Signed)
Left message for pt to call back  °

## 2015-07-01 NOTE — Consult Note (Signed)
Reason for Referral: Leukocytosis and thrombocytosis.   HPI: This is a pleasant 54 year old woman with a history of melanoma dates back to 8 years ago. At that time she had a lump removed from her anterior chest and have been disease free since that time. She had this similar recurrent GI issues including dysphagia and changes in her bowel habits and have been seen by gastroenterology in [place], [place]. She presented acutely on 06/03/2015 with abdominal pain and diarrhea and CT scan at that time showed severe diverticulitis, abscesses and possible perforation. She also had colovesical fistula and colovaginal fistula. She was hospitalized between 06/03/2015 until 06/09/2015 and had percutaneous drainage of her abscesses. During that time she was noted to have leukocytosis and thrombocytosis. Her white cell count was high as 21,000 and platelet count was 949. Her white cell count have normalized since with white cell count back to 10,000 on 06/18/2015. Her platelet count still slightly elevated at 714. Clinically, she is improving although she still have percutaneous drains in place and her appetite is very slow to recover. He does report some abdominal pain at times but no nausea or vomiting. She also noticed enlarging sternal mass for the last year. She was noted there was around her melanoma surgery and no other lumps or masses noted. She has not had any history of strokes or myocardial infarctions. Had not reported any petechiae or bleeding.  She does not report any headaches, blurry vision, syncope or seizures. She is not abort any fevers, chills or sweats. She does not report any chest pain or palpitation. Does not report any orthopnea or leg edema. Does not report any cough or hemoptysis or hematemesis. She does not report any nausea, vomiting she does report abdominal pain but no diarrhea or hematochezia. She does not report any frequency urgency or hesitancy. She does not report any  skeletal pain. Remaining review of systems unremarkable.   Past Medical History  Diagnosis Date  . Arthritis   . Depression     pt states when her husband passed away. now feels fine.  . Melanoma     Chest   :  Past Surgical History  Procedure Laterality Date  . Abdominal hysterectomy    . Tonsillectomy    . Back surgery    . Melanoma excision    . Flexible sigmoidoscopy N/A 06/05/2015    Procedure: FLEXIBLE SIGMOIDOSCOPY;  Surgeon: Manus Gunning, MD;  Location: [facility];  Service: Gastroenterology;  Laterality: N/A;  :   Current outpatient prescriptions:  .  DULoxetine (CYMBALTA) 60 MG capsule, Take 60 mg by mouth daily., Disp: , Rfl:  .  HYDROcodone-acetaminophen (NORCO/VICODIN) 5-325 MG per tablet, Take 1 tablet by mouth every 6 (six) hours as needed for moderate pain., Disp: 30 tablet, Rfl: 0 .  Prenatal Vit-Fe Fumarate-FA (PRENATAL MULTIVITAMIN) TABS tablet, Take 1 tablet by mouth daily at 12 noon., Disp: , Rfl:  .  saccharomyces boulardii (FLORASTOR) 250 MG capsule, Take 1 capsule (250 mg total) by mouth 2 (two) times daily., Disp: 60 capsule, Rfl: 0:  No Known Allergies:  Family History  Problem Relation Age of Onset  . Hypertension Father   :  Social History   Social History  . Marital Status: Widowed    Spouse Name: N/A  . Number of Children: N/A  . Years of Education: N/A   Occupational History  . Not on file.   Social History Main Topics  . Smoking status: Current Every Day Smoker -- 1.00  packs/day for 30 years  . Smokeless tobacco: Never Used     Comment: Pt info given 05-30-2015  . Alcohol Use: 0.0 oz/week    0 Standard drinks or equivalent per week     Comment: 2 glasses of wine a week.  . Drug Use: No  . Sexual Activity: No   Other Topics Concern  . Not on file   Social History Narrative  :  Pertinent items are noted in HPI.  Exam: ECOG 1 Blood pressure 131/71, pulse 109, temperature 97.7 F (36.5 C), temperature source Oral,  resp. rate 20, height 5\' 1"  (1.549 m), weight 128 lb 3.2 oz (58.151 kg), SpO2 100 %. General appearance: alert and mild distress. Chronically ill-appearing. Head: Normocephalic, without obvious abnormality.  Throat: lips, mucosa, and tongue normal; teeth and gums normal. No oral ulcers or lesions. Neck: no adenopathy, masses or thyromegaly. Chest wall examination: Incision scar noted. Slight protrusion noted around that area. No hard masses or lumps. Was soft to touch. Lymph nodes examination: I could not appreciate any axillary or inguinal or cervical adenopathy. Resp: clear to auscultation bilaterally Chest wall: no tenderness Cardio: regular rate and rhythm, S1, S2 normal, no murmur, click, rub or gallop GI: Soft, slightly tender to touch with good bowel sounds. No rebound or guarding. Extremities: extremities normal, atraumatic, no cyanosis or edema Pulses: 2+ and symmetric Skin: Skin color, texture, turgor normal. No rashes or lesions  CBC    Component Value Date/Time   WBC 10.0 06/18/2015 1108   RBC 3.83* 06/18/2015 1108   HGB 11.8* 06/18/2015 1108   HCT 35.0* 06/18/2015 1108   PLT 714* 06/18/2015 1108   MCV 91.4 06/18/2015 1108   MCH 30.8 06/18/2015 1108   MCHC 33.7 06/18/2015 1108   RDW 14.1 06/18/2015 1108   LYMPHSABS 2.5 06/18/2015 1108   MONOABS 1.3* 06/18/2015 1108   EOSABS 0.3 06/18/2015 1108   BASOSABS 0.1 06/18/2015 1108      Chemistry      Component Value Date/Time   NA 136 06/18/2015 1108   K 3.3* 06/18/2015 1108   CL 100* 06/18/2015 1108   CO2 26 06/18/2015 1108   BUN <5* 06/18/2015 1108   CREATININE 0.61 06/18/2015 1108      Component Value Date/Time   CALCIUM 9.3 06/18/2015 1108   ALKPHOS 66 06/18/2015 1108   AST 16 06/18/2015 1108   ALT 7* 06/18/2015 1108   BILITOT 0.3 06/18/2015 1108       Ct Abdomen Pelvis W Contrast  06/17/2015   CLINICAL DATA:  Pelvic diverticular abscess, status post percutaneous drainage 06/04/2015. Evidence of  colovesical fistula clinically. Outpatient follow-up.  EXAM: CT ABDOMEN AND PELVIS WITH CONTRAST  TECHNIQUE: Multidetector CT imaging of the abdomen and pelvis was performed using the standard protocol following bolus administration of intravenous contrast.  CONTRAST:  143mL ISOVUE-300 IOPAMIDOL (ISOVUE-300) INJECTION 61%  COMPARISON:  06/03/2015, 06/04/2015  FINDINGS: Lower chest: Clear lung bases. Normal heart size. No pericardial or pleural effusion. No hiatal hernia. Atherosclerosis of the lower thoracic aorta.  Abdomen: Liver demonstrates focal fatty infiltration along the falciform ligament. Small hepatic cyst measuring 7 mm in the left hepatic lobe anteriorly. No other hepatic abnormality. Patent hepatic and portal veins. No biliary dilatation. Gallbladder, biliary system, pancreas, spleen, adrenal glands, and kidneys are within normal limits for age and demonstrate no acute process. Left kidney again demonstrates an incidental parapelvic cyst measuring 2 cm. No renal obstruction or hydronephrosis. Normal excretion.  No abdominal free fluid, fluid  collection, hemorrhage, abscess or adenopathy.  Negative for bowel obstruction, dilatation, ileus, or free air.  Abdominal atherosclerosis noted without aneurysm, occlusive process, or dissection.  Pelvis: Normal appendix in the right lower quadrant. Stable large left adnexal ovarian cyst measures 8 x 6 cm, image 61. Anterior lower pelvic abscess drain is in stable position over the bladder dome. Small amount of residual air fluid about the drain catheter position. Interval improvement in the lower sigmoid and rectum diverticulitis however there is residual wall thickening, enhancement and strandy edema.  Along the right aspect of the rectum, there is extra luminal air and fluid tracking posteriorly. There is an irregular heterogeneous fluid collection possibly containing dilute contrast in the right presacral area measuring 4.4 x 2.5 cm compatible with a persistent  abscess. Density within the collection is suspicious for continued colonic leakage/perforation. This posterior collection is not drained by the current pelvic drain and appears amenable to placement of a second drain catheter with CT guidance.  Urinary bladder is collapsed. Iliac atherosclerosis noted. No pelvic adenopathy or inguinal abnormality. No hernia.  Degenerative changes of the spine, pelvis and hips. 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  IMPRESSION: Slight interval improvement in the rectus sigmoid diverticulitis.  Midline anterior pelvic drain catheter in stable position with improvement in the small abscess overlying the bladder dome.  Persistent posterior pelvic air-fluid collection possibly containing dilute contrast compatible with a persistent abscess and suspicious for a continued colonic perforation. This collection is not well drain by the existing abscess drain and appears amenable to placement of a CT-guided second drain catheter.   Electronically Signed   By: Jerilynn Mages.  Shick M.D.   On: 06/17/2015 15:43   Ct Abdomen Pelvis W Contrast  06/03/2015   CLINICAL DATA:  Patient with 2 weeks of diarrhea, lethargy and nausea/ vomiting. Low pelvic pain. Weight loss. Elevated white blood cell count. Stool coming from urethra.  EXAM: CT ABDOMEN AND PELVIS WITH CONTRAST  TECHNIQUE: Multidetector CT imaging of the abdomen and pelvis was performed using the standard protocol following bolus administration of intravenous contrast.  CONTRAST:  119mL OMNIPAQUE IOHEXOL 300 MG/ML  SOLN  COMPARISON:  Abdominal radiographs 05/28/2015  FINDINGS: Lower chest: Normal heart size. No consolidative or nodular pulmonary opacities.  Hepatobiliary: Fatty deposition adjacent to the falciform ligament. Additional adjacent sub cm low-attenuation lesion within the left hepatic lobe too small to characterize however likely represents a small cyst. Gallbladder is decompressed. No intrahepatic or extrahepatic biliary ductal  dilatation.  Pancreas: Unremarkable  Spleen: Unremarkable  Adrenals/Urinary Tract: The adrenal glands are normal. Multiple parapelvic cyst within the left renal hilum. Kidneys enhance symmetrically with contrast. There is no hydronephrosis. Wall thickening of the urinary bladder which is decompressed. There is a small amount of gas anteriorly within the urinary bladder.  Stomach/Bowel: There is circumferential wall thickening and hyperenhancement of sigmoid colon and rectum. Multiple sigmoid colonic diverticula. There is an irregular rim enhancing fluid and gas containing collection along the right aspect of the sigmoid colon with one component measuring 6.5 x 2.7 cm (image 66; series 2). More cranially there is predominantly fat tissue stranding with an additional associated fluid collection measuring 2.7 x 2.7 cm along the bladder dome (image 71; series 2). There is a fistulous connection from this overlying abscess into urinary bladder best demonstrated on image 54; series 602. Within the left hemipelvis there is a 9 x 6 cm fluid collection (image 63; series 2) which appears to represent an adnexal cyst, likely ovarian  in etiology.  Vascular/Lymphatic: No retroperitoneal lymphadenopathy. Aorta is normal in caliber. Peripheral calcified atherosclerotic plaque.  Other: Small amount a gas is demonstrated non dependently within the urinary bladder. Additionally small amount of gas is demonstrated within the vagina.  Musculoskeletal: No aggressive or acute appearing osseous lesions. Lower lumbar spine degenerative changes.  IMPRESSION: The sigmoid colon rectum is markedly thick walled most compatible with severe colitis and/or diverticulitis, which has perforated. Along the right aspect of sigmoid colon there are multiple rim enhancing fluid and gas containing collections most compatible with abscesses. There is an abscess along the bladder dome with an adjacent small focus of gas within the bladder lumen and  significant wall thickening of the adjacent bladder wall, compatible with colovesical fistula. 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  There is an 8 cm fluid collection within the left adnexa likely ovarian in etiology. This may represent a large ovarian cyst however superimposed infection is not excluded.  These results were called by telephone at the time of interpretation on 06/03/2015 at 3:13 pm to Dr. Cecille Rubin [facility] , who verbally acknowledged these results.   Electronically Signed   By: Lovey Newcomer M.D.   On: 06/03/2015 15:20   Dg Sinus/fist Tube Chk-non Gi  06/17/2015   CLINICAL DATA:  Pelvic diverticular abscess with fistula to the colon and the bladder. Outpatient follow-up. Subsequent encounter.  EXAM: ABSCESS INJECTION  CONTRAST:  20 cc Omnipaque 300  FLUOROSCOPY TIME:  Radiation Exposure Index (as provided by the fluoroscopic device): 166 dGy  If the device does not provide the exposure index:  Fluoroscopy Time (in minutes and seconds):  30 second  Number of Acquired Images:  8  COMPARISON:  06/17/2015  FINDINGS: Under sterile conditions, the existing anterior pelvic abscess drain was injected with contrast. Fluoroscopic imaging performed. Small residual collapsed abscess cavity about the drain catheter. Small fistulous connections present to the adjacent lower sigmoid colon. There are additional small fistulous tracts to the bladder. There is also contrast leakage from the colon into a more posterior pelvic abscess consistent with continued colonic perforation.  IMPRESSION: Positive exam for fistulous tract communication from the collapsed abscess cavity to the adjacent colon as well as the adjacent bladder.  Leakage of contrast within the posterior pelvic abscess cavity from the colon compatible with continued colonic perforation. This is not adequately drained by the current drain catheter position.  This posterior abscess cavity now appears amenable to  CT-guided drainage which is scheduled as an outpatient tomorrow at [facility].   Electronically Signed   By: Jerilynn Mages.  Shick M.D.   On: 06/17/2015 15:20   Ct Image Guided Drainage By Percutaneous Catheter  06/04/2015   CLINICAL DATA:  54 year old female with nonspecific colitis and colovesicular and colovaginal fistulous. Additionally, there is relatively small volume abscess in the perirectal space and superior to the bladder.  EXAM: CT IMAGE GUIDED DRAINAGE BY PERCUTANEOUS CATHETER  Date: 06/04/2015  PROCEDURE: 1. CT-guided drain placement Interventional Radiologist:  Criselda Peaches, MD  ANESTHESIA/SEDATION: Moderate (conscious) sedation was used. 2.5 mg Versed, 75 mcg Fentanyl were administered intravenously. The patient's vital signs were monitored continuously by radiology nursing throughout the procedure.  Sedation Time: 16 minutes  MEDICATIONS: None additional  TECHNIQUE: Informed consent was obtained from the patient following explanation of the procedure, risks, benefits and alternatives. The patient understands, agrees and consents for the procedure. All questions were addressed. A time out was performed.  An initial limited barium  enema was performed. The patient was then placed prone and initial axial CT imaging performed. Contrast material opacifies the redundant rectosigmoid colon. There is no focal fluid or gas collection in the perirectal space. The previously noted collections are apparently mobile and have migrated more anteriorly. There is no safe window for access from a trans gluteal approach. The large cystic structure emanating from the left ovary is again noted. Contrast material is noted collecting anterior to the bladder consistent with extravasation of the administered contrast agent and colovesicular fistula.  Therefore, the patient was maneuvered into the supine position. Axial CT imaging was performed. A small fluid and gas collection is noted superior and right lateral to  the bladder dome. This is consistent with the findings on the recent CT scan. An appropriate skin entry site was selected and marked. The region was sterilely prepped and draped in standard fashion using Betadine skin prep. Local anesthesia was attained by infiltration with 1% lidocaine. A small dermatotomy was made. Under intermittent CT fluoroscopic guidance, an 18 gauge trocar needle was advanced through the right rectus abdominis muscle and into the fluid and gas collection. An Amplatz wire was coiled within the collection. This tract was then dilated to 10 [place] and a Cook 10.2 [place] all-purpose drainage catheter advanced over the wire and formed with the locking loop centered in the small fluid and gas collection. Aspiration yields approximately 7 mL thick purulent material. This was sent for culture.  The drainage catheter was secured to the skin with 0 Prolene suture. A sterile bandage was applied. The patient tolerated the procedure well.  COMPLICATIONS: None  Estimated blood loss:  0  IMPRESSION: 1. There is no safe trans gluteal approach for abscess drainage. 2. Successful placement of an anterior approach drainage catheter into the abscess collection superior to the dome of the bladder in the presumed location of the colovesicular fistula. Aspiration yielded approximately 7 mL thick purulent material which was sent for culture.  PLAN: 1. Maintain drain to JP bulb suction for at least the first 48-72 hours. After that time, conversion to gravity bag drainage can be considered. 2. Given suspected colovesicular fistula, recommend drain injection under fluoroscopy prior to consideration of drain removal. It is likely that this drain will need to be maintained in place for a prolonged period of time.  Signed,  Criselda Peaches, MD  [facility]  [facility]   Electronically Signed   By: Jacqulynn Cadet M.D.   On: 06/04/2015 17:10   Ct Image Guided  Drainage Percut Cath  Peritoneal Retroperit  06/18/2015   CLINICAL DATA:  Pelvic diverticulitis, perforation, residual pelvic abscess posteriorly, undrained by the existing pelvic drain  EXAM: CT-GUIDED DRAIN INSERTION INTO THE POSTERIOR PELVIC ABSCESS  Date:  9/28/20169/28/2016 2:38 pm  Radiologist:  M. Daryll Brod, MD  Guidance:  CT  FLUOROSCOPY TIME:  None. Minutes  MEDICATIONS AND MEDICAL HISTORY: 3 mg Versed, 100 mcg fentanyl  ANESTHESIA/SEDATION: 15 minutes  CONTRAST:  None.  COMPLICATIONS: None immediate  PROCEDURE: Informed consent was obtained from the patient following explanation of the procedure, risks, benefits and alternatives. The patient understands, agrees and consents for the procedure. All questions were addressed. A time out was performed.  Maximal barrier sterile technique utilized including caps, mask, sterile gowns, sterile gloves, large sterile drape, hand hygiene, and ChloraPrep.  Previous imaging reviewed. Patient positioned prone. Noncontrast localization CT performed through the pelvis. The complex air, fluid and contrast collection was demonstrated along the  rectum. This was correlated with the prior study.  Under sterile conditions and local anesthesia, an 18 gauge 10 cm access needle was advanced percutaneously from a left trans gluteal approach into the collection adjacent to the rectum. Needle position confirmed with serial CT. Syringe aspiration yielded fecal contaminated fluid. Guidewire inserted. The guidewire coiled easily within the collection. Tract dilatation performed to insert a pigtail drain. Drain catheter confirmed within the collection. Syringe aspiration yielded 25 cc fecal contaminated fluid. Sample sent for Gram stain and culture. Catheter secured with a Prolene suture and connected to external gravity drainage. No immediate complication. Patient tolerated the procedure well.  IMPRESSION: Successful CT-guided left trans gluteal posterior pelvic abscess drain  insertion.   Electronically Signed   By: Jerilynn Mages.  Shick M.D.   On: 06/18/2015 16:32    Assessment and Plan:    54 year old woman with the following issues:  1. Leukocytosis and thrombocytosis: This was detected in the setting of acute colitis, abscess formation and possible perforation. Most likely this represented a reactive findings as indicated by a rapid recovery of her white cell count. Her white cell count went from 21,000 with left shift down to normal white cell count with normal differential.  Her thrombocytosis is also declining but continue to be slightly elevated. I still believe this is reactive in nature and less likely a hematological disorder. Primary myeloproliferative disorder such as essential thrombocythemia is also a possibility. Reactive thrombocytosis related to malignancy is also a consideration.  The plan is to repeat her CBC in 1 week and I will also obtain a JAK2 mutation to rule out essential thrombocythemia.  2. History of melanoma and enlarging sternal mass: We have to rule out recurrence at this time although the pattern and the presentation is not classic presentation. I do not have the details of her melanoma diagnosis or surgery. We will obtain these records when possible. CT scan images of the chest would be adequate and I discussed with her the possibility of needing a biopsy if the imaging studies are suspicious for malignancy.  The plan is to follow up after CT scan and possible biopsy to discuss these results.  3. Diverticulitis and abscess formation: She does have percutaneous drainage tube in place and possible surgery in the future. Do not think any of these findings is related to malignancy nor any treatment should be delayed because of her other findings.  All questions are answered today to her satisfaction.

## 2015-07-01 NOTE — Telephone Encounter (Signed)
Left message for Precious Bard to call back.

## 2015-07-01 NOTE — Telephone Encounter (Signed)
I did print out rx ampicillin. But unless symptomatic I would not want to rx this for lactobacillus. Since likely contaminant as prior note states. In addition she had a lot of Gi issues and was hospitalized. So would want to avoid possible c dif. So when staff member from [facility] calls back I need to talk with them.

## 2015-07-02 NOTE — Progress Notes (Signed)
Needs orders in epic for 10-25 surgery pre op is 10-20

## 2015-07-03 NOTE — Telephone Encounter (Signed)
Spoke with Patti at [facility] about pt and she still states that she is having symptoms of urgency, frequency, painful urination. Patti sent pt to surgeon Dr. Hassell Done and she is not sure if pt has been treated for UTI.

## 2015-07-03 NOTE — Telephone Encounter (Signed)
Spoke with [name] at [facility], Dr. Hassell Done has not been given any antibiotics at this time.

## 2015-07-03 NOTE — Telephone Encounter (Signed)
Left message for pt to call back  °

## 2015-07-04 ENCOUNTER — Telehealth: Payer: Self-pay | Admitting: Medical

## 2015-07-04 NOTE — Telephone Encounter (Signed)
Pt states she is having symptoms of UTI. Advised pt I will send in Rx to pharmacy for her.

## 2015-07-04 NOTE — Telephone Encounter (Signed)
Spoke with pt and she voices understanding.  

## 2015-07-04 NOTE — Telephone Encounter (Signed)
Caller name: Teliyah Royal   Relationship to patient: Self   Can be reached: [phone]   Reason for call: Pt is returning your call.Marland Kitchen

## 2015-07-04 NOTE — Telephone Encounter (Signed)
Left message for pt to call back  °

## 2015-07-07 ENCOUNTER — Other Ambulatory Visit (HOSPITAL_COMMUNITY): Payer: Self-pay | Admitting: Interventional Radiology

## 2015-07-07 ENCOUNTER — Telehealth: Payer: Self-pay | Admitting: Medical

## 2015-07-07 ENCOUNTER — Other Ambulatory Visit: Payer: Self-pay | Admitting: Surgery

## 2015-07-07 DIAGNOSIS — N739 Female pelvic inflammatory disease, unspecified: Secondary | ICD-10-CM

## 2015-07-07 NOTE — Telephone Encounter (Signed)
Spoke with pt and she voices understanding. Pt will call back if she is not feeling any better in the next day or two. Spoke with Precious Bard and she states that pt told her that she is having some stool in her urine.

## 2015-07-07 NOTE — Telephone Encounter (Signed)
Caller name: Patty  Relation to pt: RN from [place] Call back number: [phone]    Reason for call:  As per nurse ampicillin (PRINCIPEN) 250 MG capsule is causing patient to vomit and experience diarrhea. Please advise

## 2015-07-07 NOTE — Telephone Encounter (Signed)
Left message for [name] to call back.

## 2015-07-07 NOTE — Telephone Encounter (Signed)
Stop the antibiotic. As I mentioned would not give this initially unless she had symptoms of uti that were significant. Lactobacillus is normal flora. So if symptoms are persisting she would need office visit. I wrote the antibiotic because the complaint and concern was being brought up repeatetly.

## 2015-07-07 NOTE — Telephone Encounter (Signed)
Patti  From [facility] Returning your call about patient having allergic reaction to antibiotics

## 2015-07-07 NOTE — Telephone Encounter (Signed)
Edward please advise.  

## 2015-07-08 ENCOUNTER — Other Ambulatory Visit: Payer: BLUE CROSS/BLUE SHIELD

## 2015-07-08 ENCOUNTER — Encounter (HOSPITAL_COMMUNITY): Payer: Self-pay

## 2015-07-08 ENCOUNTER — Other Ambulatory Visit (HOSPITAL_BASED_OUTPATIENT_CLINIC_OR_DEPARTMENT_OTHER): Payer: BLUE CROSS/BLUE SHIELD

## 2015-07-08 ENCOUNTER — Ambulatory Visit (HOSPITAL_COMMUNITY)
Admission: RE | Admit: 2015-07-08 | Discharge: 2015-07-08 | Disposition: A | Discharge status: Home / Self Care | Payer: BLUE CROSS/BLUE SHIELD | Source: Ambulatory Visit | Attending: Oncology | Admitting: Oncology

## 2015-07-08 DIAGNOSIS — D72829 Elevated white blood cell count, unspecified: Secondary | ICD-10-CM | POA: Diagnosis not present

## 2015-07-08 DIAGNOSIS — K5792 Diverticulitis of intestine, part unspecified, without perforation or abscess without bleeding: Secondary | ICD-10-CM

## 2015-07-08 DIAGNOSIS — D75839 Thrombocytosis, unspecified: Secondary | ICD-10-CM

## 2015-07-08 DIAGNOSIS — K769 Liver disease, unspecified: Secondary | ICD-10-CM | POA: Insufficient documentation

## 2015-07-08 DIAGNOSIS — N281 Cyst of kidney, acquired: Secondary | ICD-10-CM | POA: Insufficient documentation

## 2015-07-08 DIAGNOSIS — I700 Atherosclerosis of aorta: Secondary | ICD-10-CM | POA: Insufficient documentation

## 2015-07-08 DIAGNOSIS — I2510 Atherosclerotic heart disease of native coronary artery without angina pectoris: Secondary | ICD-10-CM | POA: Diagnosis not present

## 2015-07-08 DIAGNOSIS — J439 Emphysema, unspecified: Secondary | ICD-10-CM | POA: Diagnosis not present

## 2015-07-08 DIAGNOSIS — R918 Other nonspecific abnormal finding of lung field: Secondary | ICD-10-CM | POA: Insufficient documentation

## 2015-07-08 DIAGNOSIS — C439 Malignant melanoma of skin, unspecified: Secondary | ICD-10-CM | POA: Diagnosis present

## 2015-07-08 DIAGNOSIS — D473 Essential (hemorrhagic) thrombocythemia: Secondary | ICD-10-CM

## 2015-07-08 LAB — CBC WITH DIFFERENTIAL/PLATELET
BASO%: 1.1 % (ref 0.0–2.0)
BASOS ABS: 0.1 10*3/uL (ref 0.0–0.1)
EOS ABS: 0.4 10*3/uL (ref 0.0–0.5)
EOS%: 5.8 % (ref 0.0–7.0)
HEMATOCRIT: 32.8 % — AB (ref 34.8–46.6)
HEMOGLOBIN: 10.7 g/dL — AB (ref 11.6–15.9)
LYMPH#: 1.6 10*3/uL (ref 0.9–3.3)
LYMPH%: 21.5 % (ref 14.0–49.7)
MCH: 29.2 pg (ref 25.1–34.0)
MCHC: 32.6 g/dL (ref 31.5–36.0)
MCV: 89.4 fL (ref 79.5–101.0)
MONO#: 0.7 10*3/uL (ref 0.1–0.9)
MONO%: 10.1 % (ref 0.0–14.0)
NEUT#: 4.4 10*3/uL (ref 1.5–6.5)
NEUT%: 61.5 % (ref 38.4–76.8)
PLATELETS: 527 10*3/uL — AB (ref 145–400)
RBC: 3.67 10*6/uL — AB (ref 3.70–5.45)
RDW: 15.4 % — AB (ref 11.2–14.5)
WBC: 7.2 10*3/uL (ref 3.9–10.3)

## 2015-07-08 LAB — TECHNOLOGIST REVIEW

## 2015-07-08 MED ORDER — IOHEXOL 300 MG/ML  SOLN
75.0000 mL | Freq: Once | INTRAMUSCULAR | Status: AC | PRN
  Administered 2015-07-08: 75 mL via INTRAVENOUS

## 2015-07-09 ENCOUNTER — Encounter: Payer: Self-pay | Admitting: Skilled Nursing Facility1

## 2015-07-09 ENCOUNTER — Ambulatory Visit
Admission: RE | Admit: 2015-07-09 | Discharge: 2015-07-09 | Disposition: A | Discharge status: Home / Self Care | Payer: BLUE CROSS/BLUE SHIELD | Source: Ambulatory Visit | Attending: Interventional Radiology | Admitting: Interventional Radiology

## 2015-07-09 ENCOUNTER — Ambulatory Visit
Admission: RE | Admit: 2015-07-09 | Discharge: 2015-07-09 | Disposition: A | Discharge status: Home / Self Care | Payer: BLUE CROSS/BLUE SHIELD | Source: Ambulatory Visit | Attending: Surgery | Admitting: Surgery

## 2015-07-09 DIAGNOSIS — N739 Female pelvic inflammatory disease, unspecified: Secondary | ICD-10-CM

## 2015-07-09 MED ORDER — IOPAMIDOL (ISOVUE-300) INJECTION 61%
100.0000 mL | Freq: Once | INTRAVENOUS | Status: AC | PRN
  Administered 2015-07-09: 100 mL via INTRAVENOUS

## 2015-07-09 NOTE — Progress Notes (Signed)
Chief Complaint: Patient was seen in consultation today for No chief complaint on file.  at the request of [place]  Referring Physician(s): Johnathan Hausen  History of Present Illness: [name] is a 54 y.o. 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 She underwent placement of a percutaneous drainage catheter on 06/17/2015. This was subsequently removed into the small perirectal abscess collection on 06/17/2015. This was subsequently removed on 07/04/2015 due to significant discomfort.  She is currently scheduled for surgery next week on 07/15/2015. The plan is for clean out the residual abscesses as well as partial sigmoidectomy and possibly repair of any underlying fistula.  Today, she denies new pain, fever or chills. Her drainage catheter continues to put out foul-smelling feculent material. Additionally, she continues to have intermittent debris in her urine. She is not taking any antibiotics.  Past Medical History  Diagnosis Date  . Arthritis   . Depression     pt states when her husband passed away. now feels fine.  . Melanoma [facility])     Chest     Past Surgical History  Procedure Laterality Date  . Abdominal hysterectomy    . Tonsillectomy    . Back surgery    . Melanoma excision    . Flexible sigmoidoscopy N/A 06/05/2015    Procedure: FLEXIBLE SIGMOIDOSCOPY;  Surgeon: Manus Gunning, MD;  Location: [facility];  Service: Gastroenterology;  Laterality: N/A;    Allergies: Review of patient's allergies indicates no known allergies.  Medications: Prior to Admission medications   Medication Sig Start Date End Date Taking? Authorizing Provider  ampicillin (PRINCIPEN) 250 MG capsule Take 1 capsule (250 mg total) by mouth 3 (three) times daily. Patient not taking: Reported on 07/04/2015 07/01/15   Mackie Pai, PA-C  DULoxetine (CYMBALTA) 60 MG capsule Take 60  mg by mouth daily.    Historical Provider, MD  HYDROcodone-acetaminophen (NORCO/VICODIN) 5-325 MG per tablet Take 1 tablet by mouth every 6 (six) hours as needed for moderate pain. 06/09/15   Mickeal Skinner, MD  Prenatal Vit-Fe Fumarate-FA (PRENATAL MULTIVITAMIN) TABS tablet Take 1 tablet by mouth daily at 12 noon.    Historical Provider, MD  saccharomyces boulardii (FLORASTOR) 250 MG capsule Take 1 capsule (250 mg total) by mouth 2 (two) times daily. 06/09/15   Nat Christen, PA-C     Family History  Problem Relation Age of Onset  . Hypertension Father     Social History   Social History  . Marital Status: Widowed    Spouse Name: N/A  . Number of Children: N/A  . Years of Education: N/A   Social History Main Topics  . Smoking status: Current Every Day Smoker -- 1.00 packs/day for 30 years  . Smokeless tobacco: Never Used     Comment: Pt info given 05-30-2015  . Alcohol Use: 0.0 oz/week    0 Standard drinks or equivalent per week     Comment: 2 glasses of wine a week.  . Drug Use: No  . Sexual Activity: No   Other Topics Concern  . Not on file   Social History Narrative    Review of Systems: A 12 point ROS discussed and pertinent positives are indicated in the HPI above.  All other systems are negative.  Review of Systems  Vital Signs: BP 118/72 mmHg  Pulse 108  Temp(Src) 97.7 F (36.5 C) (Oral)  SpO2 100%  Physical Exam  Constitutional: She is oriented  to person, place, and time. She appears well-developed. She is cooperative. She has a sickly appearance. No distress.  HENT:  Head: Atraumatic.  Eyes: No scleral icterus.  Cardiovascular: Tachycardia present.   Pulmonary/Chest: Effort normal.  Abdominal: Soft. She exhibits no distension. There is no tenderness.  Neurological: She is alert and oriented to person, place, and time.  Skin: Skin is warm and dry.  Psychiatric: She has a normal mood and affect. Her behavior is normal.    Imaging: Ct Chest W  Contrast  07/08/2015  CLINICAL DATA:  Restaging left chest melanoma diagnosed in 2010. Initial encounter. EXAM: CT CHEST WITH CONTRAST TECHNIQUE: Multidetector CT imaging of the chest was performed during intravenous contrast administration. CONTRAST:  77mL OMNIPAQUE IOHEXOL 300 MG/ML  SOLN COMPARISON:  Abdominal CT 06/17/2015.  No previous chest CT. FINDINGS: Mediastinum/Nodes: There are no enlarged mediastinal, hilar or axillary lymph nodes. The thyroid gland, trachea and esophagus demonstrate no significant findings. The heart size is normal. There is no pericardial effusion. There is moderate atherosclerosis of the aorta, great vessels and coronary arteries. Lungs/Pleura: There is no pleural effusion. The lungs demonstrate emphysema with small calcified granulomas in both upper lobes (images 25 and 26). In addition, there are tiny noncalcified pulmonary nodules in both upper lobes, seen on images 15 and 20. No highly suspicious nodules demonstrated. Upper abdomen: The visualized upper abdomen demonstrates no significant findings. There is focal fat adjacent to the falciform ligament which appears unchanged. There is a small hypervascular lesion in the left hepatic lobe on image 49, stable and likely a small hemangioma or other incidental vascular lesion. A left renal parapelvic cyst appears unchanged. Musculoskeletal/Chest wall: There is no chest wall mass or suspicious osseous finding. IMPRESSION: 1. No acute chest findings. 2. No findings suspicious for metastatic disease. There are tiny calcified and noncalcified pulmonary nodules bilaterally which are nonspecific although likely postinflammatory. Stability of these nodules could be addressed with follow-up CT in 6-12 months. Fleischner criteria do not apply given the history of melanoma. 3. Moderate atherosclerosis and emphysema noted. Electronically Signed   By: Richardean Sale M.D.   On: 07/08/2015 15:34   Ct Abdomen Pelvis W Contrast  06/17/2015   CLINICAL DATA:  Pelvic diverticular abscess, status post percutaneous drainage 06/04/2015. Evidence of colovesical fistula clinically. Outpatient follow-up. EXAM: CT ABDOMEN AND PELVIS WITH CONTRAST TECHNIQUE: Multidetector CT imaging of the abdomen and pelvis was performed using the standard protocol following bolus administration of intravenous contrast. CONTRAST:  118mL ISOVUE-300 IOPAMIDOL (ISOVUE-300) INJECTION 61% COMPARISON:  06/03/2015, 06/04/2015 FINDINGS: Lower chest: Clear lung bases. Normal heart size. No pericardial or pleural effusion. No hiatal hernia. Atherosclerosis of the lower thoracic aorta. Abdomen: Liver demonstrates focal fatty infiltration along the falciform ligament. Small hepatic cyst measuring 7 mm in the left hepatic lobe anteriorly. No other hepatic abnormality. Patent hepatic and portal veins. No biliary dilatation. Gallbladder, biliary system, pancreas, spleen, adrenal glands, and kidneys are within normal limits for age and demonstrate no acute process. Left kidney again demonstrates an incidental parapelvic cyst measuring 2 cm. No renal obstruction or hydronephrosis. Normal excretion. No abdominal free fluid, fluid collection, hemorrhage, abscess or adenopathy. Negative for bowel obstruction, dilatation, ileus, or free air. Abdominal atherosclerosis noted without aneurysm, occlusive process, or dissection. Pelvis: Normal appendix in the right lower quadrant. Stable large left adnexal ovarian cyst measures 8 x 6 cm, image 61. Anterior lower pelvic abscess drain is in stable position over the bladder dome. Small amount of residual air fluid about  the drain catheter position. Interval improvement in the lower sigmoid and rectum diverticulitis however there is residual wall thickening, enhancement and strandy edema. Along the right aspect of the rectum, there is extra luminal air and fluid tracking posteriorly. There is an irregular heterogeneous fluid collection possibly containing  dilute contrast in the right presacral area measuring 4.4 x 2.5 cm compatible with a persistent abscess. Density within the collection is suspicious for continued colonic leakage/perforation. This posterior collection is not drained by the current pelvic drain and appears amenable to placement of a second drain catheter with CT guidance. Urinary bladder is collapsed. Iliac atherosclerosis noted. No pelvic adenopathy or inguinal abnormality. No hernia. Degenerative changes of the spine, pelvis and hips. 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 IMPRESSION: Slight interval improvement in the rectus sigmoid diverticulitis. Midline anterior pelvic drain catheter in stable position with improvement in the small abscess overlying the bladder dome. Persistent posterior pelvic air-fluid collection possibly containing dilute contrast compatible with a persistent abscess and suspicious for a continued colonic perforation. This collection is not well drain by the existing abscess drain and appears amenable to placement of a CT-guided second drain catheter. Electronically Signed   By: Jerilynn Mages.  Shick M.D.   On: 06/17/2015 15:43   Dg Sinus/fist Tube Chk-non Gi  06/17/2015  CLINICAL DATA:  Pelvic diverticular abscess with fistula to the colon and the bladder. Outpatient follow-up. Subsequent encounter. EXAM: ABSCESS INJECTION CONTRAST:  20 cc Omnipaque 300 FLUOROSCOPY TIME:  Radiation Exposure Index (as provided by the fluoroscopic device): 166 dGy If the device does not provide the exposure index: Fluoroscopy Time (in minutes and seconds):  30 second Number of Acquired Images:  8 COMPARISON:  06/17/2015 FINDINGS: Under sterile conditions, the existing anterior pelvic abscess drain was injected with contrast. Fluoroscopic imaging performed. Small residual collapsed abscess cavity about the drain catheter. Small fistulous connections present to the adjacent lower sigmoid colon. There are additional small fistulous tracts to the  bladder. There is also contrast leakage from the colon into a more posterior pelvic abscess consistent with continued colonic perforation. IMPRESSION: Positive exam for fistulous tract communication from the collapsed abscess cavity to the adjacent colon as well as the adjacent bladder. Leakage of contrast within the posterior pelvic abscess cavity from the colon compatible with continued colonic perforation. This is not adequately drained by the current drain catheter position. This posterior abscess cavity now appears amenable to CT-guided drainage which is scheduled as an outpatient tomorrow at [facility]. Electronically Signed   By: Jerilynn Mages.  Shick M.D.   On: 06/17/2015 15:20   Ct Image Guided Drainage Percut Cath  Peritoneal Retroperit  06/18/2015  CLINICAL DATA:  Pelvic diverticulitis, perforation, residual pelvic abscess posteriorly, undrained by the existing pelvic drain EXAM: CT-GUIDED DRAIN INSERTION INTO THE POSTERIOR PELVIC ABSCESS Date:  9/28/20169/28/2016 2:38 pm Radiologist:  M. Daryll Brod, MD Guidance:  CT FLUOROSCOPY TIME:  None. Minutes MEDICATIONS AND MEDICAL HISTORY: 3 mg Versed, 100 mcg fentanyl ANESTHESIA/SEDATION: 15 minutes CONTRAST:  None. COMPLICATIONS: None immediate PROCEDURE: Informed consent was obtained from the patient following explanation of the procedure, risks, benefits and alternatives. The patient understands, agrees and consents for the procedure. All questions were addressed. A time out was performed. Maximal barrier sterile technique utilized including caps, mask, sterile gowns, sterile gloves, large sterile drape, hand hygiene, and ChloraPrep. Previous imaging reviewed. Patient positioned prone. Noncontrast localization CT performed through the pelvis. The complex air, fluid and contrast collection was demonstrated along the rectum. This was  correlated with the prior study. Under sterile conditions and local anesthesia, an 18 gauge 10 cm access needle was advanced  percutaneously from a left trans gluteal approach into the collection adjacent to the rectum. Needle position confirmed with serial CT. Syringe aspiration yielded fecal contaminated fluid. Guidewire inserted. The guidewire coiled easily within the collection. Tract dilatation performed to insert a pigtail drain. Drain catheter confirmed within the collection. Syringe aspiration yielded 25 cc fecal contaminated fluid. Sample sent for Gram stain and culture. Catheter secured with a Prolene suture and connected to external gravity drainage. No immediate complication. Patient tolerated the procedure well. IMPRESSION: Successful CT-guided left trans gluteal posterior pelvic abscess drain insertion. Electronically Signed   By: Jerilynn Mages.  Shick M.D.   On: 06/18/2015 16:32    Labs:  CBC:  Recent Labs  06/06/15 0520 06/07/15 0535 06/18/15 1108 07/08/15 1130  WBC 10.7* 9.7 10.0 7.2  HGB 11.4* 11.6* 11.8* 10.7*  HCT 34.4* 35.8* 35.0* 32.8*  PLT 560* 575* 714* 527*    COAGS:  Recent Labs  06/04/15 1211  INR 1.09    BMP:  Recent Labs  06/03/15 1843 06/04/15 0448 06/06/15 0520 06/18/15 1108  NA 128* 136 141 136  K 3.0* 3.6 3.8 3.3*  CL 91* 101 109 100*  CO2 27 29 26 26   GLUCOSE 105* 120* 131* 109*  BUN <5* <5* <5* <5*  CALCIUM 9.2 8.8* 8.8* 9.3  CREATININE 0.53 0.49 0.51 0.61  GFRNONAA >60 >60 >60 >60  GFRAA >60 >60 >60 >60    LIVER FUNCTION TESTS:  Recent Labs  05/28/15 0948 05/30/15 1614 06/03/15 1843 06/18/15 1108  BILITOT 0.4 0.4 0.2* 0.3  AST 10 9 24 16   ALT 7 6 11* 7*  ALKPHOS 64 63 52 66  PROT 6.8 7.3 6.7 6.9  ALBUMIN 3.1* 3.3* 3.1* 3.1*    TUMOR MARKERS: No results for input(s): AFPTM, CEA, CA199, CHROMGRNA in the last 8760 hours.  Assessment and Plan:  54 year old female with a history of colitis, treated by intra-abdominal abscess as well as colovesicular and colovaginal fistula. She continues to have feculent output from her existing drainage catheter. She is  scheduled for surgery next week.  Debris and her urine continue to occur intermittently.   1.) We will leave drain in place for now. The drain can be removed at the time of surgery.  Thank you for this interesting consult.  I greatly enjoyed meeting Karlie Aung and look forward to participating in their care.  A copy of this report was sent to the requesting provider on this date.  SignedJacqulynn Cadet 07/09/2015, 2:27 PM   I spent a total of  10 Minutes in face to face in clinical consultation, greater than 50% of which was counseling/coordinating care for abscess drain.

## 2015-07-09 NOTE — Progress Notes (Signed)
Subjective:     Patient ID: Claudina Lick, female   DOB: Jul 16, 1961, 54 y.o.   MRN: [id number]  HPI   Review of Systems     Objective:   Physical Exam To assist the pt in identifying some dietary strategies to gain some lost wt back.    Assessment:     Pt identified as being malnourished due to losing some wt. Pt contacted via the telephone at [phone]. Pt states she has lost about 30 pounds and is now 122 pounds. Pt states her appetite is better and eating more. Pt states she does not drink ensure or boost because she does not like them so she tried muscle milk and slim fast but is not keen on those either.    Plan:     Dietitian educated the pt on why ensure/boost would better fit her needs rather than slim fast/muscle milk. Dietitian also advised she try the ensure/boost again but this time in a covered cup with a lot of ice or turn them into a milkshake/smoothie. Dietitian also advised the pt to not avoid foods she previously thought of as unhealthy.  Dietitian will contact Ernestene Kiel CSO,RD,LDN about sending the pt some nutrition information.

## 2015-07-09 NOTE — Patient Instructions (Addendum)
Atiyana Johnston  07/09/2015   Your procedure is scheduled on: Tuesday 07/15/2015  Report to [facility] Main  Entrance take [place]  elevators to 3rd floor to  [place] at  1215 AM.  Call this number if you have problems the morning of surgery [phone]   Remember: ONLY 1 PERSON MAY GO WITH YOU TO SHORT STAY TO GET  READY MORNING OF [place].   Do not eat food  :After Midnight. MAY HAVE CLEAR LIQUIDS FROM MIDNIGHT TIL 0815 AM THEN NOTHING TIL AFTER SURGERY!               FOLLOW BOWEL PREP INSTRUCTIONS FROM DR. MARTIN'S OFFICE THE DAY BEFORE SURGERY ON Monday 07/14/2015 AITH A CLEAR LIQUID DIET ALL DAY TIL MIDNIGHT!   Take these medicines the morning of surgery with A SIP OF WATER: CYMBALTA                DO NOT TAKE ANY DIABETIC MEDICATIONS DAY OF YOUR SURGERY!                               You may not have any metal on your body including hair pins and              piercings  Do not wear jewelry, make-up, lotions, powders or perfumes, deodorant             Do not wear nail polish.  Do not shave  48 hours prior to surgery.              Men may shave face and neck.   Do not bring valuables to the hospital. [place].  Contacts, dentures or bridgework may not be worn into surgery.  Leave suitcase in the car. After surgery it may be brought to your room.     Patients discharged the day of surgery will not be allowed to drive home.  Name and phone number of your driver:  Special Instructions: N/A              Please read over the following fact sheets you were given: _____________________________________________________________________             [facility] - Preparing for Surgery Before surgery, you can play an important role.  Because skin is not sterile, your skin needs to be as free of germs as possible.  You can reduce the number of germs on your skin by washing with CHG (chlorahexidine  gluconate) soap before surgery.  CHG is an antiseptic cleaner which kills germs and bonds with the skin to continue killing germs even after washing. Please DO NOT use if you have an allergy to CHG or antibacterial soaps.  If your skin becomes reddened/irritated stop using the CHG and inform your nurse when you arrive at Short Stay. Do not shave (including legs and underarms) for at least 48 hours prior to the first CHG shower.  You may shave your face/neck. Please follow these instructions carefully:  1.  Shower with CHG Soap the night before surgery and the  morning of Surgery.  2.  If you choose to wash your hair, wash your hair first as usual with your  normal  shampoo.  3.  After you shampoo, rinse your hair and body thoroughly to remove the  shampoo.                           4.  Use CHG as you would any other liquid soap.  You can apply chg directly  to the skin and wash                       Gently with a scrungie or clean washcloth.  5.  Apply the CHG Soap to your body ONLY FROM THE NECK DOWN.   Do not use on face/ open                           Wound or open sores. Avoid contact with eyes, ears mouth and genitals (private parts).                       Wash face,  Genitals (private parts) with your normal soap.             6.  Wash thoroughly, paying special attention to the area where your surgery  will be performed.  7.  Thoroughly rinse your body with warm water from the neck down.  8.  DO NOT shower/wash with your normal soap after using and rinsing off  the CHG Soap.                9.  Pat yourself dry with a clean towel.            10.  Wear clean pajamas.            11.  Place clean sheets on your bed the night of your first shower and do not  sleep with pets. Day of Surgery : Do not apply any lotions/deodorants the morning of surgery.  Please wear clean clothes to the hospital/surgery center.  FAILURE TO FOLLOW THESE INSTRUCTIONS MAY RESULT IN THE CANCELLATION OF YOUR  SURGERY PATIENT SIGNATURE_________________________________  NURSE SIGNATURE__________________________________  ________________________________________________________________________   Adam Phenix  An incentive spirometer is a tool that can help keep your lungs clear and active. This tool measures how well you are filling your lungs with each breath. Taking long deep breaths may help reverse or decrease the chance of developing breathing (pulmonary) problems (especially infection) following:  A long period of time when you are unable to move or be active. BEFORE THE PROCEDURE   If the spirometer includes an indicator to show your best effort, your nurse or respiratory therapist will set it to a desired goal.  If possible, sit up straight or lean slightly forward. Try not to slouch.  Hold the incentive spirometer in an upright position. INSTRUCTIONS FOR USE   Sit on the edge of your bed if possible, or sit up as far as you can in bed or on a chair.  Hold the incentive spirometer in an upright position.  Breathe out normally.  Place the mouthpiece in your mouth and seal your lips tightly around it.  Breathe in slowly and as deeply as possible, raising the piston or the ball toward the top of the column.  Hold your breath for 3-5 seconds or for as long as possible. Allow the piston or ball to fall to the bottom of the column.  Remove the mouthpiece from your mouth and breathe out normally.  Rest for a  few seconds and repeat Steps 1 through 7 at least 10 times every 1-2 hours when you are awake. Take your time and take a few normal breaths between deep breaths.  The spirometer may include an indicator to show your best effort. Use the indicator as a goal to work toward during each repetition.  After each set of 10 deep breaths, practice coughing to be sure your lungs are clear. If you have an incision (the cut made at the time of surgery), support your incision when  coughing by placing a pillow or rolled up towels firmly against it. Once you are able to get out of bed, walk around indoors and cough well. You may stop using the incentive spirometer when instructed by your caregiver.  RISKS AND COMPLICATIONS  Take your time so you do not get dizzy or light-headed.  If you are in pain, you may need to take or ask for pain medication before doing incentive spirometry. It is harder to take a deep breath if you are having pain. AFTER USE  Rest and breathe slowly and easily.  It can be helpful to keep track of a log of your progress. Your caregiver can provide you with a simple table to help with this. If you are using the spirometer at home, follow these instructions: [place] IF:   You are having difficultly using the spirometer.  You have trouble using the spirometer as often as instructed.  Your pain medication is not giving enough relief while using the spirometer.  You develop fever of 100.5 F (38.1 C) or higher. SEEK IMMEDIATE MEDICAL CARE IF:   You cough up bloody sputum that had not been present before.  You develop fever of 102 F (38.9 C) or greater.  You develop worsening pain at or near the incision site. MAKE SURE YOU:   Understand these instructions.  Will watch your condition.  Will get help right away if you are not doing well or get worse. Document Released: 01/17/2007 Document Revised: 11/29/2011 Document Reviewed: 03/20/2007 ExitCare Patient Information 2014 ExitCare, [place].   ________________________________________________________________________  WHAT IS A BLOOD TRANSFUSION? Blood Transfusion Information  A transfusion is the replacement of blood or some of its parts. Blood is made up of multiple cells which provide different functions.  Red blood cells carry oxygen and are used for blood loss replacement.  White blood cells fight against infection.  Platelets control bleeding.  Plasma helps clot  blood.  Other blood products are available for specialized needs, such as hemophilia or other clotting disorders. BEFORE THE TRANSFUSION  Who gives blood for transfusions?   Healthy volunteers who are fully evaluated to make sure their blood is safe. This is blood bank blood. Transfusion therapy is the safest it has ever been in the practice of medicine. Before blood is taken from a donor, a complete history is taken to make sure that person has no history of diseases nor engages in risky social behavior (examples are intravenous drug use or sexual activity with multiple partners). The donor's travel history is screened to minimize risk of transmitting infections, such as malaria. The donated blood is tested for signs of infectious diseases, such as HIV and hepatitis. The blood is then tested to be sure it is compatible with you in order to minimize the chance of a transfusion reaction. If you or a relative donates blood, this is often done in anticipation of surgery and is not appropriate for emergency situations. It takes many days to  process the donated blood. RISKS AND COMPLICATIONS Although transfusion therapy is very safe and saves many lives, the main dangers of transfusion include:   Getting an infectious disease.  Developing a transfusion reaction. This is an allergic reaction to something in the blood you were given. Every precaution is taken to prevent this. The decision to have a blood transfusion has been considered carefully by your caregiver before blood is given. Blood is not given unless the benefits outweigh the risks. AFTER THE TRANSFUSION  Right after receiving a blood transfusion, you will usually feel much better and more energetic. This is especially true if your red blood cells have gotten low (anemic). The transfusion raises the level of the red blood cells which carry oxygen, and this usually causes an energy increase.  The nurse administering the transfusion will monitor  you carefully for complications. HOME CARE INSTRUCTIONS  No special instructions are needed after a transfusion. You may find your energy is better. Speak with your caregiver about any limitations on activity for underlying diseases you may have. SEEK MEDICAL CARE IF:   Your condition is not improving after your transfusion.  You develop redness or irritation at the intravenous (IV) site. SEEK IMMEDIATE MEDICAL CARE IF:  Any of the following symptoms occur over the next 12 hours:  Shaking chills.  You have a temperature by mouth above 102 F (38.9 C), not controlled by medicine.  Chest, back, or muscle pain.  People around you feel you are not acting correctly or are confused.  Shortness of breath or difficulty breathing.  Dizziness and fainting.  You get a rash or develop hives.  You have a decrease in urine output.  Your urine turns a dark color or changes to pink, red, or brown. Any of the following symptoms occur over the next 10 days:  You have a temperature by mouth above 102 F (38.9 C), not controlled by medicine.  Shortness of breath.  Weakness after normal activity.  The white part of the eye turns yellow (jaundice).  You have a decrease in the amount of urine or are urinating less often.  Your urine turns a dark color or changes to pink, red, or brown. Document Released: 09/03/2000 Document Revised: 11/29/2011 Document Reviewed: 04/22/2008 ExitCare Patient Information 2014 ExitCare, [place].  _______________________________________________________________________   CLEAR LIQUID DIET   Foods Allowed                                                                     Foods Excluded  Coffee and tea, regular and decaf                             liquids that you cannot  Plain Jell-O in any flavor                                             see through such as: Fruit ices (not with fruit pulp)  milk, soups, orange juice   Iced Popsicles                                    All solid food Carbonated beverages, regular and diet                                    Cranberry, grape and apple juices Sports drinks like Gatorade Lightly seasoned clear broth or consume(fat free) Sugar, honey syrup  Sample Menu Breakfast                                Lunch                                     Supper Cranberry juice                    Beef broth                            Chicken broth Jell-O                                     Grape juice                           Apple juice Coffee or tea                        Jell-O                                      Popsicle                                                Coffee or tea                        Coffee or tea  _____________________________________________________________________

## 2015-07-10 ENCOUNTER — Encounter (HOSPITAL_COMMUNITY): Payer: Self-pay

## 2015-07-10 ENCOUNTER — Encounter (INDEPENDENT_AMBULATORY_CARE_PROVIDER_SITE_OTHER): Payer: Self-pay

## 2015-07-10 ENCOUNTER — Ambulatory Visit: Payer: Self-pay | Admitting: Surgery

## 2015-07-10 ENCOUNTER — Encounter (HOSPITAL_COMMUNITY)
Admission: RE | Admit: 2015-07-10 | Discharge: 2015-07-10 | Disposition: A | Discharge status: Home / Self Care | Payer: BLUE CROSS/BLUE SHIELD | Source: Ambulatory Visit | Attending: Surgery | Admitting: Surgery

## 2015-07-10 DIAGNOSIS — Z01818 Encounter for other preprocedural examination: Secondary | ICD-10-CM | POA: Insufficient documentation

## 2015-07-10 DIAGNOSIS — N824 Other female intestinal-genital tract fistulae: Secondary | ICD-10-CM | POA: Diagnosis not present

## 2015-07-10 LAB — BASIC METABOLIC PANEL
Anion gap: 6 (ref 5–15)
CHLORIDE: 98 mmol/L — AB (ref 101–111)
CO2: 30 mmol/L (ref 22–32)
CREATININE: 0.49 mg/dL (ref 0.44–1.00)
Calcium: 8.7 mg/dL — ABNORMAL LOW (ref 8.9–10.3)
GFR calc Af Amer: >60 mL/min (ref >60)
GFR calc non Af Amer: >60 mL/min (ref >60)
Glucose, Bld: 112 mg/dL — ABNORMAL HIGH (ref 65–99)
POTASSIUM: 4.2 mmol/L (ref 3.5–5.1)
Sodium: 134 mmol/L — ABNORMAL LOW (ref 135–145)

## 2015-07-10 LAB — CBC
HEMATOCRIT: 32.6 % — AB (ref 36.0–46.0)
HEMOGLOBIN: 10.6 g/dL — AB (ref 12.0–15.0)
MCH: 29.4 pg (ref 26.0–34.0)
MCHC: 32.5 g/dL (ref 30.0–36.0)
MCV: 90.3 fL (ref 78.0–100.0)
Platelets: 631 10*3/uL — ABNORMAL HIGH (ref 150–400)
RBC: 3.61 MIL/uL — AB (ref 3.87–5.11)
RDW: 15.3 % (ref 11.5–15.5)
WBC: 12.1 10*3/uL — ABNORMAL HIGH (ref 4.0–10.5)

## 2015-07-10 LAB — ABO/RH: ABO/RH(D): B POS

## 2015-07-10 NOTE — Consult Note (Addendum)
07/10/15 WOC consult requested for presurgical stoma site marking for possible colostomy or ileostomy.  Pt plans for surgery with Dr Hassell Done on 10/25.  Assessed abd while standing and sitting.  Mark placed within line of vision, to area free from folds, within rectus muscles. Placement above or below this site should be avoided if possible, since there are folds in these locations. Skin cleansed with chlorhexidine wipe prior to mark placement and Tegaderm dressings placed over sites.  Site located 6 cm to the left of umbilicus, 3 cm below, to LLQ and 6 cm to the right of the umbilicus, 3 cm below, to RLQ.  Briefly discussed pouching routines and demonstrated pouch appearance.  Educational materials given to the patient.  [place] team will follow post-op for teaching sessions if patient recieves an ostomy. Please re-consult if further assistance is needed.  Thank-you,  Julien Girt MSN, [place], [place], [place], [place]

## 2015-07-11 LAB — HEMOGLOBIN A1C
Hgb A1c MFr Bld: 5.5 % (ref 4.8–5.6)
MEAN PLASMA GLUCOSE: 111 mg/dL

## 2015-07-15 ENCOUNTER — Inpatient Hospital Stay (HOSPITAL_COMMUNITY)
Admission: RE | Admit: 2015-07-15 | Discharge: 2015-07-21 | DRG: 982 | Disposition: A | Discharge status: Home Health | Payer: BLUE CROSS/BLUE SHIELD | Source: Ambulatory Visit | Attending: Surgery | Admitting: Surgery

## 2015-07-15 ENCOUNTER — Inpatient Hospital Stay (HOSPITAL_COMMUNITY): Payer: BLUE CROSS/BLUE SHIELD | Admitting: Anesthesiology

## 2015-07-15 ENCOUNTER — Encounter (HOSPITAL_COMMUNITY): Payer: Self-pay | Admitting: *Deleted

## 2015-07-15 ENCOUNTER — Encounter (HOSPITAL_COMMUNITY)
Admission: RE | Disposition: A | Discharge status: Home Health | Payer: Self-pay | Source: Ambulatory Visit | Attending: Surgery

## 2015-07-15 DIAGNOSIS — K5780 Diverticulitis of intestine, part unspecified, with perforation and abscess without bleeding: Secondary | ICD-10-CM | POA: Diagnosis present

## 2015-07-15 DIAGNOSIS — Z66 Do not resuscitate: Secondary | ICD-10-CM | POA: Diagnosis present

## 2015-07-15 DIAGNOSIS — Z85820 Personal history of malignant melanoma of skin: Secondary | ICD-10-CM

## 2015-07-15 DIAGNOSIS — Z79891 Long term (current) use of opiate analgesic: Secondary | ICD-10-CM

## 2015-07-15 DIAGNOSIS — M1990 Unspecified osteoarthritis, unspecified site: Secondary | ICD-10-CM | POA: Diagnosis present

## 2015-07-15 DIAGNOSIS — Z79899 Other long term (current) drug therapy: Secondary | ICD-10-CM | POA: Diagnosis not present

## 2015-07-15 DIAGNOSIS — Z01812 Encounter for preprocedural laboratory examination: Secondary | ICD-10-CM

## 2015-07-15 DIAGNOSIS — F17210 Nicotine dependence, cigarettes, uncomplicated: Secondary | ICD-10-CM | POA: Diagnosis present

## 2015-07-15 DIAGNOSIS — D649 Anemia, unspecified: Secondary | ICD-10-CM | POA: Diagnosis present

## 2015-07-15 DIAGNOSIS — Z8249 Family history of ischemic heart disease and other diseases of the circulatory system: Secondary | ICD-10-CM | POA: Diagnosis not present

## 2015-07-15 DIAGNOSIS — N321 Vesicointestinal fistula: Principal | ICD-10-CM | POA: Diagnosis present

## 2015-07-15 DIAGNOSIS — N824 Other female intestinal-genital tract fistulae: Secondary | ICD-10-CM | POA: Diagnosis present

## 2015-07-15 HISTORY — PX: PARTIAL COLECTOMY: SHX5273

## 2015-07-15 LAB — TYPE AND SCREEN
ABO/RH(D): B POS
ANTIBODY SCREEN: NEGATIVE

## 2015-07-15 LAB — CREATININE, SERUM: Creatinine, Ser: 0.41 mg/dL — ABNORMAL LOW (ref 0.44–1.00)

## 2015-07-15 LAB — CBC
HEMATOCRIT: 28 % — AB (ref 36.0–46.0)
HEMOGLOBIN: 9.3 g/dL — AB (ref 12.0–15.0)
MCH: 29.5 pg (ref 26.0–34.0)
MCHC: 33.2 g/dL (ref 30.0–36.0)
MCV: 88.9 fL (ref 78.0–100.0)
Platelets: 625 10*3/uL — ABNORMAL HIGH (ref 150–400)
RBC: 3.15 MIL/uL — AB (ref 3.87–5.11)
RDW: 15.8 % — AB (ref 11.5–15.5)
WBC: 29 10*3/uL — AB (ref 4.0–10.5)

## 2015-07-15 SURGERY — COLECTOMY, PARTIAL
Anesthesia: General

## 2015-07-15 MED ORDER — BUPIVACAINE-EPINEPHRINE (PF) 0.25% -1:200000 IJ SOLN
INTRAMUSCULAR | Status: AC
  Filled 2015-07-15: qty 30

## 2015-07-15 MED ORDER — FENTANYL CITRATE (PF) 250 MCG/5ML IJ SOLN
INTRAMUSCULAR | Status: AC
  Filled 2015-07-15: qty 25

## 2015-07-15 MED ORDER — ALUM & MAG HYDROXIDE-SIMETH 200-200-20 MG/5ML PO SUSP: 30.0000 mL | Freq: Four times a day (QID) | ORAL | Status: DC | PRN

## 2015-07-15 MED ORDER — SUGAMMADEX SODIUM 200 MG/2ML IV SOLN
INTRAVENOUS | Status: DC | PRN
  Administered 2015-07-15: 200 mg via INTRAVENOUS

## 2015-07-15 MED ORDER — SACCHAROMYCES BOULARDII 250 MG PO CAPS
250.0000 mg | ORAL_CAPSULE | Freq: Two times a day (BID) | ORAL | Status: DC
  Administered 2015-07-16 – 2015-07-21 (×10): 250 mg via ORAL
  Filled 2015-07-15 (×13): qty 1

## 2015-07-15 MED ORDER — PHENYLEPHRINE 40 MCG/ML (10ML) SYRINGE FOR IV PUSH (FOR BLOOD PRESSURE SUPPORT)
PREFILLED_SYRINGE | INTRAVENOUS | Status: AC
  Filled 2015-07-15: qty 20

## 2015-07-15 MED ORDER — ESMOLOL HCL 100 MG/10ML IV SOLN
INTRAVENOUS | Status: DC | PRN
  Administered 2015-07-15: 20 mg via INTRAVENOUS

## 2015-07-15 MED ORDER — HYDROMORPHONE HCL 1 MG/ML IJ SOLN
INTRAMUSCULAR | Status: AC
  Filled 2015-07-15: qty 1

## 2015-07-15 MED ORDER — CHLORHEXIDINE GLUCONATE CLOTH 2 % EX PADS: 6.0000 | MEDICATED_PAD | Freq: Once | CUTANEOUS | Status: DC

## 2015-07-15 MED ORDER — HYDROMORPHONE HCL 1 MG/ML IJ SOLN
0.5000 mg | INTRAMUSCULAR | Status: DC | PRN
  Administered 2015-07-15 – 2015-07-16 (×7): 0.5 mg via INTRAVENOUS
  Filled 2015-07-15 (×8): qty 1

## 2015-07-15 MED ORDER — 0.9 % SODIUM CHLORIDE (POUR BTL) OPTIME
TOPICAL | Status: DC | PRN
  Administered 2015-07-15: 2000 mL

## 2015-07-15 MED ORDER — PHENYLEPHRINE HCL 10 MG/ML IJ SOLN
INTRAMUSCULAR | Status: DC | PRN
  Administered 2015-07-15 (×3): 40 ug via INTRAVENOUS
  Administered 2015-07-15: 80 ug via INTRAVENOUS

## 2015-07-15 MED ORDER — ACETAMINOPHEN 10 MG/ML IV SOLN
INTRAVENOUS | Status: AC
  Filled 2015-07-15: qty 100

## 2015-07-15 MED ORDER — DEXTROSE 5 % IV SOLN
2.0000 g | INTRAVENOUS | Status: AC
  Administered 2015-07-15: 2 g via INTRAVENOUS
  Filled 2015-07-15: qty 2

## 2015-07-15 MED ORDER — MIDAZOLAM HCL 2 MG/2ML IJ SOLN
INTRAMUSCULAR | Status: AC
  Filled 2015-07-15: qty 4

## 2015-07-15 MED ORDER — DIPHENHYDRAMINE HCL 25 MG PO CAPS: 25.0000 mg | ORAL_CAPSULE | Freq: Four times a day (QID) | ORAL | Status: DC | PRN

## 2015-07-15 MED ORDER — KCL IN DEXTROSE-NACL 20-5-0.45 MEQ/L-%-% IV SOLN
INTRAVENOUS | Status: DC
  Administered 2015-07-15: 22:00:00 via INTRAVENOUS
  Administered 2015-07-16: 100 mL/h via INTRAVENOUS
  Administered 2015-07-16 – 2015-07-18 (×3): via INTRAVENOUS
  Administered 2015-07-19: 75 mL via INTRAVENOUS
  Administered 2015-07-19 – 2015-07-21 (×2): via INTRAVENOUS
  Filled 2015-07-15 (×15): qty 1000

## 2015-07-15 MED ORDER — SUGAMMADEX SODIUM 200 MG/2ML IV SOLN
INTRAVENOUS | Status: AC
Start: 2015-07-15 — End: 2015-07-15
  Filled 2015-07-15: qty 2

## 2015-07-15 MED ORDER — ZOLPIDEM TARTRATE 5 MG PO TABS
5.0000 mg | ORAL_TABLET | Freq: Every evening | ORAL | Status: DC | PRN
Start: 2015-07-15 — End: 2015-07-22

## 2015-07-15 MED ORDER — SUCCINYLCHOLINE CHLORIDE 20 MG/ML IJ SOLN
INTRAMUSCULAR | Status: DC | PRN
  Administered 2015-07-15: 100 mg via INTRAVENOUS

## 2015-07-15 MED ORDER — DEXTROSE 5 % IV SOLN
2.0000 g | Freq: Two times a day (BID) | INTRAVENOUS | Status: AC
  Administered 2015-07-15: 2 g via INTRAVENOUS
  Filled 2015-07-15: qty 2

## 2015-07-15 MED ORDER — ALBUTEROL SULFATE HFA 108 (90 BASE) MCG/ACT IN AERS
INHALATION_SPRAY | RESPIRATORY_TRACT | Status: AC
  Filled 2015-07-15: qty 6.7

## 2015-07-15 MED ORDER — PROPOFOL 10 MG/ML IV BOLUS
INTRAVENOUS | Status: AC
  Filled 2015-07-15: qty 20

## 2015-07-15 MED ORDER — HEPARIN SODIUM (PORCINE) 5000 UNIT/ML IJ SOLN
5000.0000 [IU] | Freq: Three times a day (TID) | INTRAMUSCULAR | Status: DC
  Administered 2015-07-16 – 2015-07-21 (×17): 5000 [IU] via SUBCUTANEOUS
  Filled 2015-07-15 (×19): qty 1

## 2015-07-15 MED ORDER — ONDANSETRON HCL 4 MG/2ML IJ SOLN: 4.0000 mg | Freq: Four times a day (QID) | INTRAMUSCULAR | Status: DC | PRN

## 2015-07-15 MED ORDER — ONDANSETRON HCL 4 MG/2ML IJ SOLN
INTRAMUSCULAR | Status: DC | PRN
  Administered 2015-07-15: 4 mg via INTRAVENOUS

## 2015-07-15 MED ORDER — FENTANYL CITRATE (PF) 100 MCG/2ML IJ SOLN
INTRAMUSCULAR | Status: DC | PRN
  Administered 2015-07-15 (×10): 50 ug via INTRAVENOUS

## 2015-07-15 MED ORDER — ACETAMINOPHEN 10 MG/ML IV SOLN
1000.0000 mg | Freq: Once | INTRAVENOUS | Status: AC
  Administered 2015-07-15: 1000 mg via INTRAVENOUS

## 2015-07-15 MED ORDER — PROMETHAZINE HCL 25 MG/ML IJ SOLN: 6.2500 mg | INTRAMUSCULAR | Status: DC | PRN

## 2015-07-15 MED ORDER — DIPHENHYDRAMINE HCL 50 MG/ML IJ SOLN: 25.0000 mg | Freq: Four times a day (QID) | INTRAMUSCULAR | Status: DC | PRN

## 2015-07-15 MED ORDER — LACTATED RINGERS IV SOLN
INTRAVENOUS | Status: DC
  Administered 2015-07-15: 18:00:00 via INTRAVENOUS
  Administered 2015-07-15: 1000 mL via INTRAVENOUS
  Administered 2015-07-15 (×2): via INTRAVENOUS

## 2015-07-15 MED ORDER — LACTATED RINGERS IR SOLN
Status: DC | PRN
  Administered 2015-07-15: 1

## 2015-07-15 MED ORDER — PROPOFOL 10 MG/ML IV BOLUS
INTRAVENOUS | Status: DC | PRN
  Administered 2015-07-15: 20 mg via INTRAVENOUS
  Administered 2015-07-15: 120 mg via INTRAVENOUS

## 2015-07-15 MED ORDER — DEXAMETHASONE SODIUM PHOSPHATE 10 MG/ML IJ SOLN
INTRAMUSCULAR | Status: DC | PRN
  Administered 2015-07-15: 10 mg via INTRAVENOUS

## 2015-07-15 MED ORDER — LIDOCAINE HCL (CARDIAC) 20 MG/ML IV SOLN
INTRAVENOUS | Status: DC | PRN
  Administered 2015-07-15: 80 mg via INTRAVENOUS
  Administered 2015-07-15: 100 mg via INTRATRACHEAL

## 2015-07-15 MED ORDER — MIDAZOLAM HCL 5 MG/5ML IJ SOLN
INTRAMUSCULAR | Status: DC | PRN
  Administered 2015-07-15: 2 mg via INTRAVENOUS

## 2015-07-15 MED ORDER — HYDROMORPHONE HCL 1 MG/ML IJ SOLN
0.2500 mg | INTRAMUSCULAR | Status: DC | PRN
  Administered 2015-07-15: 0.25 mg via INTRAVENOUS
  Administered 2015-07-15: 0.5 mg via INTRAVENOUS
  Administered 2015-07-15: 0.25 mg via INTRAVENOUS
  Administered 2015-07-15: 0.5 mg via INTRAVENOUS
  Administered 2015-07-15 (×2): 0.25 mg via INTRAVENOUS

## 2015-07-15 MED ORDER — ATROPINE SULFATE 0.4 MG/ML IJ SOLN
INTRAMUSCULAR | Status: AC
  Filled 2015-07-15: qty 1

## 2015-07-15 MED ORDER — ONDANSETRON HCL 4 MG PO TABS: 4.0000 mg | ORAL_TABLET | Freq: Four times a day (QID) | ORAL | Status: DC | PRN

## 2015-07-15 MED ORDER — ROCURONIUM BROMIDE 100 MG/10ML IV SOLN
INTRAVENOUS | Status: DC | PRN
  Administered 2015-07-15: 10 mg via INTRAVENOUS
  Administered 2015-07-15: 30 mg via INTRAVENOUS

## 2015-07-15 MED ORDER — DULOXETINE HCL 60 MG PO CPEP
60.0000 mg | ORAL_CAPSULE | Freq: Every day | ORAL | Status: DC
  Administered 2015-07-16 – 2015-07-21 (×6): 60 mg via ORAL
  Filled 2015-07-15 (×6): qty 1

## 2015-07-15 MED ORDER — ALBUTEROL SULFATE HFA 108 (90 BASE) MCG/ACT IN AERS
INHALATION_SPRAY | RESPIRATORY_TRACT | Status: DC | PRN
  Administered 2015-07-15: 4 via RESPIRATORY_TRACT

## 2015-07-15 MED ORDER — HEPARIN SODIUM (PORCINE) 5000 UNIT/ML IJ SOLN
5000.0000 [IU] | Freq: Once | INTRAMUSCULAR | Status: AC
  Administered 2015-07-15: 5000 [IU] via SUBCUTANEOUS
  Filled 2015-07-15: qty 1

## 2015-07-15 MED ORDER — EPHEDRINE SULFATE 50 MG/ML IJ SOLN
INTRAMUSCULAR | Status: AC
Start: 2015-07-15 — End: 2015-07-15
  Filled 2015-07-15: qty 1

## 2015-07-15 MED ORDER — SODIUM CHLORIDE 0.9 % IJ SOLN
INTRAMUSCULAR | Status: AC
  Filled 2015-07-15: qty 10

## 2015-07-15 SURGICAL SUPPLY — 69 items
APPLICATOR COTTON TIP 6IN STRL (MISCELLANEOUS) IMPLANT
BLADE EXTENDED COATED 6.5IN (ELECTRODE) ×2 IMPLANT
BLADE HEX COATED 2.75 (ELECTRODE) IMPLANT
BLADE SURG SZ10 CARB STEEL (BLADE) IMPLANT
CABLE HIGH FREQUENCY MONO STRZ (ELECTRODE) ×2 IMPLANT
CELLS DAT CNTRL 66122 CELL SVR (MISCELLANEOUS) ×1 IMPLANT
CLIP TI LARGE 6 (CLIP) IMPLANT
COUNTER NEEDLE 20 DBL MAG RED (NEEDLE) ×2 IMPLANT
COVER MAYO STAND STRL (DRAPES) ×6 IMPLANT
COVER SURGICAL LIGHT HANDLE (MISCELLANEOUS) ×4 IMPLANT
DRAPE LAPAROSCOPIC ABDOMINAL (DRAPES) ×2 IMPLANT
DRAPE SHEET LG 3/4 BI-LAMINATE (DRAPES) ×2 IMPLANT
DRAPE UTILITY XL STRL (DRAPES) ×2 IMPLANT
DRAPE WARM FLUID 44X44 (DRAPE) ×2 IMPLANT
DRSG PAD ABDOMINAL 8X10 ST (GAUZE/BANDAGES/DRESSINGS) IMPLANT
ELECT PENCIL ROCKER SW 15FT (MISCELLANEOUS) ×4 IMPLANT
ELECT REM PT RETURN 9FT ADLT (ELECTROSURGICAL) ×2
ELECTRODE REM PT RTRN 9FT ADLT (ELECTROSURGICAL) ×1 IMPLANT
ENSEAL DEVICE STD TIP 35CM (ENDOMECHANICALS) IMPLANT
GAUZE SPONGE 4X4 12PLY STRL (GAUZE/BANDAGES/DRESSINGS) IMPLANT
GLOVE BIO SURGEON STRL SZ7.5 (GLOVE) ×2 IMPLANT
GLOVE BIOGEL M 8.0 STRL (GLOVE) ×4 IMPLANT
GLOVE BIOGEL PI IND STRL 7.0 (GLOVE) ×2 IMPLANT
GLOVE BIOGEL PI INDICATOR 7.0 (GLOVE) ×2
GLOVE ECLIPSE 8.0 STRL XLNG CF (GLOVE) ×6 IMPLANT
GLOVE INDICATOR 8.0 STRL GRN (GLOVE) ×4 IMPLANT
GOWN SPEC L4 XLG W/TWL (GOWN DISPOSABLE) ×2 IMPLANT
GOWN STRL REUS W/TWL LRG LVL3 (GOWN DISPOSABLE) ×4 IMPLANT
GOWN STRL REUS W/TWL XL LVL3 (GOWN DISPOSABLE) ×6 IMPLANT
KIT BASIN OR (CUSTOM PROCEDURE TRAY) ×2 IMPLANT
LEGGING LITHOTOMY PAIR STRL (DRAPES) ×2 IMPLANT
LIGASURE IMPACT 36 18CM CVD LR (INSTRUMENTS) ×2 IMPLANT
NS IRRIG 1000ML POUR BTL (IV SOLUTION) ×4 IMPLANT
PACK COLON (CUSTOM PROCEDURE TRAY) ×2 IMPLANT
PACK GENERAL/GYN (CUSTOM PROCEDURE TRAY) IMPLANT
PAD POSITIONING PINK XL (MISCELLANEOUS) ×2 IMPLANT
RTRCTR WOUND ALEXIS 18CM MED (MISCELLANEOUS) ×2
SET IRRIG TUBING LAPAROSCOPIC (IRRIGATION / IRRIGATOR) ×2 IMPLANT
SHEARS HARMONIC ACE PLUS 36CM (ENDOMECHANICALS) ×2 IMPLANT
SLEEVE XCEL OPT CAN 5 100 (ENDOMECHANICALS) ×4 IMPLANT
SPONGE DRAIN TRACH 4X4 STRL 2S (GAUZE/BANDAGES/DRESSINGS) ×2 IMPLANT
STAPLER CUT CVD 40MM BLUE (STAPLE) ×2 IMPLANT
STAPLER CUT RELOAD GREEN (STAPLE) ×2 IMPLANT
STAPLER VISISTAT 35W (STAPLE) ×2 IMPLANT
SUCTION POOLE TIP (SUCTIONS) IMPLANT
SUT CHROMIC 3 0 SH 27 (SUTURE) ×4 IMPLANT
SUT ETHILON 2 0 PS N (SUTURE) ×2 IMPLANT
SUT NOV 1 T60/GS (SUTURE) IMPLANT
SUT NOVA NAB DX-16 0-1 5-0 T12 (SUTURE) ×4 IMPLANT
SUT NOVA T20/GS 25 (SUTURE) IMPLANT
SUT PDS AB 1 TP1 96 (SUTURE) ×4 IMPLANT
SUT PROLENE 2 0 BLUE (SUTURE) ×2 IMPLANT
SUT SILK 2 0 (SUTURE) ×2
SUT SILK 2 0 SH CR/8 (SUTURE) ×2 IMPLANT
SUT SILK 2 0SH CR/8 30 (SUTURE) IMPLANT
SUT SILK 2-0 18XBRD TIE 12 (SUTURE) ×2 IMPLANT
SUT SILK 2-0 30XBRD TIE 12 (SUTURE) IMPLANT
SUT SILK 3 0 (SUTURE) ×1
SUT SILK 3 0 SH CR/8 (SUTURE) ×2 IMPLANT
SUT SILK 3-0 18XBRD TIE 12 (SUTURE) ×1 IMPLANT
SUT VIC AB 2-0 SH 18 (SUTURE) ×2 IMPLANT
TOWEL OR 17X26 10 PK STRL BLUE (TOWEL DISPOSABLE) ×4 IMPLANT
TOWEL OR NON WOVEN STRL DISP B (DISPOSABLE) ×4 IMPLANT
TRAY FOLEY W/METER SILVER 14FR (SET/KITS/TRAYS/PACK) ×2 IMPLANT
TRAY FOLEY W/METER SILVER 16FR (SET/KITS/TRAYS/PACK) IMPLANT
TROCAR BLADELESS OPT 5 100 (ENDOMECHANICALS) ×2 IMPLANT
TUBING INSUFFLATION 10FT LAP (TUBING) ×2 IMPLANT
WATER STERILE IRR 1500ML POUR (IV SOLUTION) IMPLANT
YANKAUER SUCT BULB TIP NO VENT (SUCTIONS) IMPLANT

## 2015-07-15 NOTE — H&P (Signed)
Chief Complaint:  Colovesicle/colovaginal fistula  History of Present Illness:  [name] is an 54 y.o. female who presented to [facility] on Sept 13 with a one month history of diarrhea and recent feces per vagina.  She was treated with antibiotics and percutaneous drainage of peri colonic abscesses thought secondary to diverticulitis or possibly IBD.  She did not tolerate the percutaneous drains so well at home and she has been followed by me in the office.  She is aware that she may require a colostomy.  Past Medical History  Diagnosis Date  . Arthritis   . Depression     pt states when her husband passed away. now feels fine.  . Melanoma [facility])     Chest     Past Surgical History  Procedure Laterality Date  . Abdominal hysterectomy    . Tonsillectomy    . Back surgery    . Melanoma excision    . Flexible sigmoidoscopy N/A 06/05/2015    Procedure: FLEXIBLE SIGMOIDOSCOPY;  Surgeon: Manus Gunning, MD;  Location: [facility];  Service: Gastroenterology;  Laterality: N/A;    No current facility-administered medications for this encounter.   Current Outpatient Prescriptions  Medication Sig Dispense Refill  . DULoxetine (CYMBALTA) 60 MG capsule Take 60 mg by mouth daily.    Marland Kitchen HYDROcodone-acetaminophen (NORCO/VICODIN) 5-325 MG per tablet Take 1 tablet by mouth every 6 (six) hours as needed for moderate pain. 30 tablet 0  . Prenatal Vit-Fe Fumarate-FA (PRENATAL MULTIVITAMIN) TABS tablet Take 1 tablet by mouth daily at 12 noon.    . saccharomyces boulardii (FLORASTOR) 250 MG capsule Take 1 capsule (250 mg total) by mouth 2 (two) times daily. 60 capsule 0  . ampicillin (PRINCIPEN) 250 MG capsule Take 1 capsule (250 mg total) by mouth 3 (three) times daily. (Patient not taking: Reported on 07/04/2015) 21 capsule 0-   Ampicillin Family History  Problem Relation Age of Onset  . Hypertension Father    Social History:   reports that she has been smoking Cigarettes.  She has a 15  pack-year smoking history. She has never used smokeless tobacco. She reports that she drinks alcohol. She reports that she does not use illicit drugs.   REVIEW OF SYSTEMS : Negative except for her depression and her DNR.  She has had a skin melanoma but has no known terminal diseases but she did care for her late husband for 4 years as he suffered with colon cancer and chemotherapy  Physical Exam:   There were no vitals taken for this visit. There is no weight on file to calculate BMI.  Gen:  WDthin WF NAD  Neurological: Alert and oriented to person, place, and time. Motor and sensory function is grossly intact  Head: Normocephalic and atraumatic.  Eyes: Conjunctivae are normal. Pupils are equal, round, and reactive to light. No scleral icterus.  Neck: Normal range of motion. Neck supple. No tracheal deviation or thyromegaly present.  Cardiovascular:  SR without murmurs or gallops.  No carotid bruits Breast:  Not examined Respiratory: Effort normal.  No respiratory distress. No chest wall tenderness. Breath sounds normal.  No wheezes, rales or rhonchi.  Abdomen:  Soft and flat GU:  Posterior perc drain site Musculoskeletal: Normal range of motion. Extremities are nontender. No cyanosis, edema or clubbing noted Lymphadenopathy: No cervical, preauricular, postauricular or axillary adenopathy is present Skin: Skin is warm and dry. No rash noted. No diaphoresis. No erythema. No pallor. Pscyh: Normal mood and affect. Behavior is normal. Judgment  and thought content normal.   LABORATORY RESULTS: No results found for this or any previous visit (from the past 48 hour(s)).   RADIOLOGY RESULTS: No results found.  Problem List: Patient Active Problem List   Diagnosis Date Noted  . Colonic diverticular abscess   . 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 Large bowel perforation ([place])   . Palliative care encounter 06/06/2015  . DNR (do not resuscitate) 06/06/2015  .  Colovaginal fistula   . Colovesical fistula   . Pelvic abscess in female   . Malnutrition of moderate degree ([place]) 06/04/2015  . Perforated sigmoid colon ([place]) 06/03/2015  . Diarrhea 05/28/2015  . Arthritis 05/20/2015  . Melanoma of skin ([place]) 05/20/2015  . Depression 05/20/2015    Assessment & Plan: Colovaginal/colovesicle fistula.  Plan takedown of fistula with possible colostomy.      Matt B. Hassell Done, MD, [facility] [phone] beeper [phone]  07/15/2015 10:51 AM

## 2015-07-15 NOTE — Anesthesia Preprocedure Evaluation (Signed)
Anesthesia Evaluation  Patient identified by MRN, date of birth, ID band Patient awake    Reviewed: Allergy & Precautions, H&P , NPO status , Patient's Chart, lab work & pertinent test results  History of Anesthesia Complications Negative for: history of anesthetic complications  Airway Mallampati: II  TM Distance: >3 FB Neck ROM: full    Dental no notable dental hx.    Pulmonary Current Smoker,    Pulmonary exam normal breath sounds clear to auscultation       Cardiovascular negative cardio ROS Normal cardiovascular exam Rhythm:regular Rate:Normal     Neuro/Psych Depression negative neurological ROS     GI/Hepatic negative GI ROS, Neg liver ROS,   Endo/Other  negative endocrine ROS  Renal/GU negative Renal ROS     Musculoskeletal  (+) Arthritis ,   Abdominal   Peds  Hematology negative hematology ROS (+)   Anesthesia Other Findings Patient with skin melanoma, has chronic pelvic abscess with colovaginal fistula  Reproductive/Obstetrics negative OB ROS                             Anesthesia Physical Anesthesia Plan  ASA: III  Anesthesia Plan: General   Post-op Pain Management:    Induction: Intravenous  Airway Management Planned: Oral ETT  Additional Equipment: None  Intra-op Plan:   Post-operative Plan: Extubation in OR  Informed Consent: I have reviewed the patients History and Physical, chart, labs and discussed the procedure including the risks, benefits and alternatives for the proposed anesthesia with the patient or authorized representative who has indicated his/her understanding and acceptance.   Dental Advisory Given  Plan Discussed with: Anesthesiologist, CRNA and Surgeon  Anesthesia Plan Comments:         Anesthesia Quick Evaluation

## 2015-07-15 NOTE — Anesthesia Procedure Notes (Signed)
Procedure Name: Intubation Date/Time: 07/15/2015 3:15 PM Performed by: Montel Clock Pre-anesthesia Checklist: Patient identified, Emergency Drugs available, Suction available, Patient being monitored and Timeout performed Patient Re-evaluated:Patient Re-evaluated prior to inductionOxygen Delivery Method: Circle system utilized Preoxygenation: Pre-oxygenation with 100% oxygen Intubation Type: IV induction Ventilation: Mask ventilation without difficulty Laryngoscope Size: Mac and 3 Grade View: Grade I Tube type: Oral Tube size: 7.5 mm Number of attempts: 1 Airway Equipment and Method: Stylet Placement Confirmation: ETT inserted through vocal cords under direct vision,  positive ETCO2 and breath sounds checked- equal and bilateral Secured at: 21 cm Tube secured with: Tape Dental Injury: Teeth and Oropharynx as per pre-operative assessment

## 2015-07-15 NOTE — Transfer of Care (Signed)
Immediate Anesthesia Transfer of Care Note  Patient: [name]  Procedure(s) Performed: Procedure(s): LAPAROSCOPIC ASSISTED SIGMOID COLECTOMY WITH COLOSTOMY, HARTMANN PROCEDURE, WITH PLACEMENT OF WOUND VAC (N/A)  Patient Location: PACU  Anesthesia Type:General  Level of Consciousness:  sedated, patient cooperative and responds to stimulation  Airway & Oxygen Therapy:Patient Spontanous Breathing and Patient connected to face mask oxgen  Post-op Assessment:  Report given to PACU RN and Post -op Vital signs reviewed and stable  Post vital signs:  Reviewed and stable  Last Vitals:  Filed Vitals:   07/15/15 1226  BP:   Pulse: 112  Temp:   Resp:     Complications: No apparent anesthesia complications

## 2015-07-15 NOTE — Interval H&P Note (Signed)
History and Physical Interval Note:  07/15/2015 2:14 PM  [name]  has presented today for surgery, with the diagnosis of COLOVAGINAL FISTULA  The various methods of treatment have been discussed with the patient and family. After consideration of risks, benefits and other options for treatment, the patient has consented to  Procedure(s): SIGMOID COLECTOMY POSSIBLE COLOSTOMY (N/A) as a surgical intervention .  The patient's history has been reviewed, patient examined, no change in status, stable for surgery.  I have reviewed the patient's chart and labs.  Questions were answered to the patient's satisfaction.     [name] B

## 2015-07-15 NOTE — Brief Op Note (Signed)
07/15/2015  6:06 PM  PATIENT:  Claudina Lick  54 y.o. female  PRE-OPERATIVE DIAGNOSIS:  COLOVAGINAL FISTULA  POST-OPERATIVE DIAGNOSIS:  COLOVAGINAL FISTULA  PROCEDURE:  Procedure(s): LAPAROSCOPIC ASSISTED SIGMOID COLECTOMY WITH COLOSTOMY, HARTMANN PROCEDURE, WITH PLACEMENT OF WOUND VAC (N/A)  SURGEON:  Surgeon(s) and Role:    * Johnathan Hausen, MD - Primary    * Greer Pickerel, MD - Assisting  PHYSICIAN ASSISTANT:   ASSISTANTS: Greer Pickerel, MD, FACS   ANESTHESIA:   general  EBL:  Total I/O In: 3000 [I.V.:3000] Out: 350 [Urine:150; Blood:200]  BLOOD ADMINISTERED:none  DRAINS: (1) Jackson-Pratt drain(s) with closed bulb suction in the pelvis   LOCAL MEDICATIONS USED:  NONE  SPECIMEN:  Source of Specimen:  sigmoid colon  DISPOSITION OF SPECIMEN:  PATHOLOGY  COUNTS:  YES  TOURNIQUET:  * No tourniquets in log *  DICTATION: .Other Dictation: Dictation Number [id number]  PLAN OF CARE: Admit to inpatient   PATIENT DISPOSITION:  PACU - hemodynamically stable.   Delay start of Pharmacological VTE agent (>24hrs) due to surgical blood loss or risk of bleeding: yes

## 2015-07-16 ENCOUNTER — Encounter (HOSPITAL_COMMUNITY): Payer: Self-pay | Admitting: Surgery

## 2015-07-16 LAB — BASIC METABOLIC PANEL
Anion gap: 7 (ref 5–15)
CHLORIDE: 102 mmol/L (ref 101–111)
CO2: 27 mmol/L (ref 22–32)
CREATININE: 0.62 mg/dL (ref 0.44–1.00)
Calcium: 8.3 mg/dL — ABNORMAL LOW (ref 8.9–10.3)
GFR calc Af Amer: >60 mL/min (ref >60)
GFR calc non Af Amer: >60 mL/min (ref >60)
GLUCOSE: 163 mg/dL — AB (ref 65–99)
POTASSIUM: 3.2 mmol/L — AB (ref 3.5–5.1)
Sodium: 136 mmol/L (ref 135–145)

## 2015-07-16 LAB — CBC
HEMATOCRIT: 28.3 % — AB (ref 36.0–46.0)
Hemoglobin: 9.3 g/dL — ABNORMAL LOW (ref 12.0–15.0)
MCH: 29.4 pg (ref 26.0–34.0)
MCHC: 32.9 g/dL (ref 30.0–36.0)
MCV: 89.6 fL (ref 78.0–100.0)
Platelets: 684 10*3/uL — ABNORMAL HIGH (ref 150–400)
RBC: 3.16 MIL/uL — ABNORMAL LOW (ref 3.87–5.11)
RDW: 16 % — AB (ref 11.5–15.5)
WBC: 22 10*3/uL — AB (ref 4.0–10.5)

## 2015-07-16 MED ORDER — CETYLPYRIDINIUM CHLORIDE 0.05 % MT LIQD
7.0000 mL | Freq: Two times a day (BID) | OROMUCOSAL | Status: DC
  Administered 2015-07-16 – 2015-07-17 (×3): 7 mL via OROMUCOSAL

## 2015-07-16 MED ORDER — MORPHINE SULFATE (PF) 2 MG/ML IV SOLN
1.0000 mg | INTRAVENOUS | Status: DC | PRN
  Administered 2015-07-16 (×2): 2 mg via INTRAVENOUS
  Administered 2015-07-16: 1 mg via INTRAVENOUS
  Administered 2015-07-16 (×2): 2 mg via INTRAVENOUS
  Administered 2015-07-16: 1 mg via INTRAVENOUS
  Administered 2015-07-17 (×6): 4 mg via INTRAVENOUS
  Administered 2015-07-17: 2 mg via INTRAVENOUS
  Administered 2015-07-17 – 2015-07-18 (×9): 4 mg via INTRAVENOUS
  Filled 2015-07-16 (×2): qty 2
  Filled 2015-07-16: qty 1
  Filled 2015-07-16 (×4): qty 2
  Filled 2015-07-16: qty 1
  Filled 2015-07-16: qty 2
  Filled 2015-07-16: qty 1
  Filled 2015-07-16: qty 2
  Filled 2015-07-16: qty 1
  Filled 2015-07-16 (×5): qty 2
  Filled 2015-07-16: qty 1
  Filled 2015-07-16: qty 2
  Filled 2015-07-16: qty 1
  Filled 2015-07-16 (×2): qty 2

## 2015-07-16 MED ORDER — SODIUM CHLORIDE 0.9 % IV BOLUS (SEPSIS)
500.0000 mL | Freq: Once | INTRAVENOUS | Status: AC
  Administered 2015-07-16: 500 mL via INTRAVENOUS

## 2015-07-16 MED ORDER — CHLORHEXIDINE GLUCONATE 0.12 % MT SOLN
15.0000 mL | Freq: Two times a day (BID) | OROMUCOSAL | Status: DC
  Administered 2015-07-17 – 2015-07-21 (×8): 15 mL via OROMUCOSAL
  Filled 2015-07-16 (×11): qty 15

## 2015-07-16 NOTE — Progress Notes (Signed)
Patient ID: [name], female   DOB: 08-19-1961, 54 y.o.   MRN: [id number] [facility] Surgery Progress Note:   1 Day Post-Op  Subjective: Mental status is clear. Objective: Vital signs in last 24 hours: Temp:  [97.5 F (36.4 C)-98.9 F (37.2 C)] 98.1 F (36.7 C) (10/26 1000) Pulse Rate:  [71-125] 71 (10/26 1000) Resp:  [11-20] 20 (10/26 1000) BP: (100-152)/(58-94) 111/58 mmHg (10/26 1000) SpO2:  [96 %-100 %] 96 % (10/26 1000) Weight:  [56.048 kg (123 lb 9 oz)] 56.048 kg (123 lb 9 oz) (10/25 1226)  Intake/Output from previous day: 10/25 0701 - 10/26 0700 In: 3981.7 [I.V.:3981.7] Out: 1290 [Urine:880; Drains:210; Blood:200] Intake/Output this shift: Total I/O In: 385 [I.V.:385] Out: 0   Physical Exam: Work of breathing is normal. JP serosanguinous.  Incision OK.  Ostomy without output  Lab Results:  Results for orders placed or performed during the hospital encounter of 07/15/15 (from the past 48 hour(s))  CBC     Status: Abnormal   Collection Time: 07/15/15  9:06 PM  Result Value Ref Range   WBC 29.0 (H) 4.0 - 10.5 K/uL   RBC 3.15 (L) 3.87 - 5.11 MIL/uL   Hemoglobin 9.3 (L) 12.0 - 15.0 g/dL   HCT 28.0 (L) 36.0 - 46.0 %   MCV 88.9 78.0 - 100.0 fL   MCH 29.5 26.0 - 34.0 pg   MCHC 33.2 30.0 - 36.0 g/dL   RDW 15.8 (H) 11.5 - 15.5 %   Platelets 625 (H) 150 - 400 K/uL  Creatinine, serum     Status: Abnormal   Collection Time: 07/15/15  9:06 PM  Result Value Ref Range   Creatinine, Ser 0.41 (L) 0.44 - 1.00 mg/dL   GFR calc non Af Amer >60 >60 mL/min   GFR calc Af Amer >60 >60 mL/min    Comment: (NOTE) The eGFR has been calculated using the CKD EPI equation. This calculation has not been validated in all clinical situations. eGFR's persistently <60 mL/min signify possible Chronic Kidney Disease.   Basic metabolic panel     Status: Abnormal   Collection Time: 07/16/15  5:45 AM  Result Value Ref Range   Sodium 136 135 - 145 mmol/L   Potassium 3.2 (L) 3.5 - 5.1  mmol/L   Chloride 102 101 - 111 mmol/L   CO2 27 22 - 32 mmol/L   Glucose, Bld 163 (H) 65 - 99 mg/dL   BUN <5 (L) 6 - 20 mg/dL   Creatinine, Ser 0.62 0.44 - 1.00 mg/dL   Calcium 8.3 (L) 8.9 - 10.3 mg/dL   GFR calc non Af Amer >60 >60 mL/min   GFR calc Af Amer >60 >60 mL/min    Comment: (NOTE) The eGFR has been calculated using the CKD EPI equation. This calculation has not been validated in all clinical situations. eGFR's persistently <60 mL/min signify possible Chronic Kidney Disease.    Anion gap 7 5 - 15  CBC     Status: Abnormal   Collection Time: 07/16/15  5:45 AM  Result Value Ref Range   WBC 22.0 (H) 4.0 - 10.5 K/uL   RBC 3.16 (L) 3.87 - 5.11 MIL/uL   Hemoglobin 9.3 (L) 12.0 - 15.0 g/dL   HCT 28.3 (L) 36.0 - 46.0 %   MCV 89.6 78.0 - 100.0 fL   MCH 29.4 26.0 - 34.0 pg   MCHC 32.9 30.0 - 36.0 g/dL   RDW 16.0 (H) 11.5 - 15.5 %   Platelets 684 (H)  150 - 400 K/uL    Radiology/Results: No results found.  Anti-infectives: Anti-infectives    Start     Dose/Rate Route Frequency Ordered Stop   07/15/15 2200  cefoTEtan (CEFOTAN) 2 g in dextrose 5 % 50 mL IVPB     2 g 100 mL/hr over 30 Minutes Intravenous Every 12 hours 07/15/15 1915 07/15/15 2251   07/15/15 1158  cefoTEtan (CEFOTAN) 2 g in dextrose 5 % 50 mL IVPB     2 g 100 mL/hr over 30 Minutes Intravenous On call to O.R. 07/15/15 1158 07/15/15 1520      Assessment/Plan: Problem List: Patient Active Problem List   Diagnosis Date Noted  . Colonic diverticular abscess   . 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 Large bowel perforation ([place])   . Palliative care encounter 06/06/2015  . DNR (do not resuscitate) 06/06/2015  . Colovaginal fistula   . Colovesical fistula   . Pelvic abscess in female   . Malnutrition of moderate degree ([place]) 06/04/2015  . Perforated sigmoid colon ([place]) 06/03/2015  . Diarrhea 05/28/2015  . Arthritis 05/20/2015  . Melanoma of skin ([place]) 05/20/2015  . Depression  05/20/2015    Per patient request I placed her back as a DNR.  Will offer ice chips and observe until passing flatus.   1 Day Post-Op    LOS: 1 day   Matt B. Hassell Done, MD, [facility] [phone] beeper [phone]  07/16/2015 11:30 AM

## 2015-07-16 NOTE — Op Note (Signed)
[name], [name] NO.:  [id number]  MEDICAL RECORD NO.:  [id number]  LOCATION:  6979                         FACILITY:  [facility]  PHYSICIAN:  Isabel Caprice. Hassell Done, MD  DATE OF BIRTH:  05-Dec-1960  DATE OF PROCEDURE:  07/15/2015 DATE OF DISCHARGE:                              OPERATIVE REPORT   PREOPERATIVE DIAGNOSIS:  Apparent diverticular abscesses with colovesical and colovaginal fistulae.  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  PROCEDURES:  Laparoscopy with open takedown of fistulae, resection of segment of sigmoid colon, and descending colostomy (Hartmann procedure) with Prolene suture sutured to the distal stump.  SURGEON:  Isabel Caprice. Hassell Done, MD  ASSISTANT:  Greer Pickerel.  ANESTHESIA:  General endotracheal.  DESCRIPTION OF PROCEDURE:  The patient was taken to room #4 of [place], and placed up in the [place].  She previously had a vaginal douche and had a bowel prep.  She had an anterior drain still in the cavity which was draining stoolish material.  This was first removed.  The patient was then prepped.  Preoperatively, she had been marked for ostomies and these were re-marked.  After a time-out was performed, I entered the abdomen through the left upper quadrant using 5 mm OptiView and insufflated without difficulty. A second 5 mm was placed down on the left side at the site of a possible ostomy.  I then found the pelvis to be pretty well walled off.  I took down some adhesions to the midline where the previous drains had been. The colon, however, was very mobile on the left lateral gutter and the descending portion of the sigmoid was plastered into the pelvis.  I elected to open with a midline incision in the lower midline, entering without difficulty.  Using finger fracture, I was able to take the sigmoid colon down off the bladder and down as far as I could into  the pelvis.  However, I was unable to get any soft bowel.  It was all pretty firm.  There were 2 loops of small bowel stuck down onto an abscess and I had to mobilize those and get them out of the pelvis.  I went proximally and divided the bowel with a contour stapler.  First, a blue load and then later I used it down below with the green load.  However, first, I divided the bowel proximally and then went through the mesentery with the LigaSure and carried this down, and then was able to get around this very thickened colon and divided it with a contour stapler.  There was still sigmoid below that, it was firm.  A 19 Blake drain was placed in the pelvis.  Bleeding had been controlled with the LigaSure.  The area was irrigated.  The ostomy was matured.  This was done with 3-0 running locking chromic. The wound was closed with interrupted #1 Novafil and a wound VAC.  The patient tolerated the procedure well and was taken to the recovery room.  At the end of the case, I did check her vagina and she has some obviously granulation tissue and there was some watery bloody drainage coming out of  the vagina, consistent with bloody irrigation.  The patient tolerated the procedure well and was taken to the recovery room in satisfactory condition.     Isabel Caprice Hassell Done, MD     MBM/MEDQ  D:  07/15/2015  T:  07/16/2015  Job:  [id number]

## 2015-07-16 NOTE — Consult Note (Addendum)
WOC ostomy consult note Stoma type/location: LLQ Coostomy Stomal assessment/size: Slightly smaller than 1 and 1/4 inches round, moist, edematous, slightly dusky Peristomal assessment: intact Treatment options for stomal/peristomal skin: Skin barrier ring Output: Scant serosanguinous Ostomy pouching: 1pc pouching system with skin barrier ring.  Education provided: Patient is taught stoma characteristics and pouch characteristics, but has been recently medicated for pain and it is difficult for her to fucus.  RN states that patient has been uncomfortable most of afternoon. A teaching booklet is provided as well as a 1-page tip sheet for 1-piece pouch application.  3 pouches, 3 rings and a measuring guide/pattern are provided in the cupboard over the sink in her room. Patient is shown how to perform the Lock and Roll Closure of the pouching system x2 and is able to give a return demonstration of this skill. She is taught that pouches are emptied several times a day, but that they are typically changed every 2-3 days. Enrolled patient in [place] program: Yes  (4-CeraPlus1 piece pouching systems, opaque with integrated gas filter, 4 skin barrier rings plus Adapt pectin powder) on 07/16/15 Note:  NPWT dressing in place and drape is trimmed slightly to accommodate pouch change.  MD note requests that  abdominal dressing be left in place for at least 48 hours (written at approximately 6pm on 10/25) which may mean that the first surgical dressing change (NPWT) and next pouch change could be on Friday, 10/28. I will be out of town until Monday, 10/31, so will ask my partners to follow up on this in my absence. [place] nursing team will follow, and will remain available to this patient, the nursing, surgical and medical teams.  Thanks, Maudie Flakes, MSN, RN, [place], [place], [place] [phone])

## 2015-07-17 ENCOUNTER — Other Ambulatory Visit: Payer: BLUE CROSS/BLUE SHIELD

## 2015-07-17 MED ORDER — CETYLPYRIDINIUM CHLORIDE 0.05 % MT LIQD
7.0000 mL | Freq: Two times a day (BID) | OROMUCOSAL | Status: DC
  Administered 2015-07-17 – 2015-07-21 (×5): 7 mL via OROMUCOSAL

## 2015-07-17 NOTE — Progress Notes (Signed)
Patient ID: [name], female   DOB: 06/15/1961, 54 y.o.   MRN: [id number] [facility] Surgery Progress Note:   2 Days Post-Op  Subjective: Mental status is clear.  Had sharp gas pains Objective: Vital signs in last 24 hours: Temp:  [97.6 F (36.4 C)-98.1 F (36.7 C)] 97.6 F (36.4 C) (10/27 0554) Pulse Rate:  [71-102] 102 (10/27 0554) Resp:  [18-20] 20 (10/27 0554) BP: (111-125)/(58-74) 113/74 mmHg (10/27 0554) SpO2:  [96 %-100 %] 99 % (10/27 0554)  Intake/Output from previous day: 10/26 0701 - 10/27 0700 In: 1285 [I.V.:785; IV Piggyback:500] Out: 1405 [Urine:1250; Drains:155] Intake/Output this shift:    Physical Exam: Work of breathing is not elevated.  Incision with VAC.  Ostomy bag changed but no gas yet.    Lab Results:  Results for orders placed or performed during the hospital encounter of 07/15/15 (from the past 48 hour(s))  CBC     Status: Abnormal   Collection Time: 07/15/15  9:06 PM  Result Value Ref Range   WBC 29.0 (H) 4.0 - 10.5 K/uL   RBC 3.15 (L) 3.87 - 5.11 MIL/uL   Hemoglobin 9.3 (L) 12.0 - 15.0 g/dL   HCT 28.0 (L) 36.0 - 46.0 %   MCV 88.9 78.0 - 100.0 fL   MCH 29.5 26.0 - 34.0 pg   MCHC 33.2 30.0 - 36.0 g/dL   RDW 15.8 (H) 11.5 - 15.5 %   Platelets 625 (H) 150 - 400 K/uL  Creatinine, serum     Status: Abnormal   Collection Time: 07/15/15  9:06 PM  Result Value Ref Range   Creatinine, Ser 0.41 (L) 0.44 - 1.00 mg/dL   GFR calc non Af Amer >60 >60 mL/min   GFR calc Af Amer >60 >60 mL/min    Comment: (NOTE) The eGFR has been calculated using the CKD EPI equation. This calculation has not been validated in all clinical situations. eGFR's persistently <60 mL/min signify possible Chronic Kidney Disease.   Basic metabolic panel     Status: Abnormal   Collection Time: 07/16/15  5:45 AM  Result Value Ref Range   Sodium 136 135 - 145 mmol/L   Potassium 3.2 (L) 3.5 - 5.1 mmol/L   Chloride 102 101 - 111 mmol/L   CO2 27 22 - 32 mmol/L   Glucose,  Bld 163 (H) 65 - 99 mg/dL   BUN <5 (L) 6 - 20 mg/dL   Creatinine, Ser 0.62 0.44 - 1.00 mg/dL   Calcium 8.3 (L) 8.9 - 10.3 mg/dL   GFR calc non Af Amer >60 >60 mL/min   GFR calc Af Amer >60 >60 mL/min    Comment: (NOTE) The eGFR has been calculated using the CKD EPI equation. This calculation has not been validated in all clinical situations. eGFR's persistently <60 mL/min signify possible Chronic Kidney Disease.    Anion gap 7 5 - 15  CBC     Status: Abnormal   Collection Time: 07/16/15  5:45 AM  Result Value Ref Range   WBC 22.0 (H) 4.0 - 10.5 K/uL   RBC 3.16 (L) 3.87 - 5.11 MIL/uL   Hemoglobin 9.3 (L) 12.0 - 15.0 g/dL   HCT 28.3 (L) 36.0 - 46.0 %   MCV 89.6 78.0 - 100.0 fL   MCH 29.4 26.0 - 34.0 pg   MCHC 32.9 30.0 - 36.0 g/dL   RDW 16.0 (H) 11.5 - 15.5 %   Platelets 684 (H) 150 - 400 K/uL    Radiology/Results: No results  found.  Anti-infectives: Anti-infectives    Start     Dose/Rate Route Frequency Ordered Stop   07/15/15 2200  cefoTEtan (CEFOTAN) 2 g in dextrose 5 % 50 mL IVPB     2 g 100 mL/hr over 30 Minutes Intravenous Every 12 hours 07/15/15 1915 07/15/15 2251   07/15/15 1158  cefoTEtan (CEFOTAN) 2 g in dextrose 5 % 50 mL IVPB     2 g 100 mL/hr over 30 Minutes Intravenous On call to O.R. 07/15/15 1158 07/15/15 1520      Assessment/Plan: Problem List: Patient Active Problem List   Diagnosis Date Noted  . Colonic diverticular abscess   . 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 Large bowel perforation ([place])   . Palliative care encounter 06/06/2015  . DNR (do not resuscitate) 06/06/2015  . Colovaginal fistula   . Colovesical fistula   . Pelvic abscess in female   . Malnutrition of moderate degree ([place]) 06/04/2015  . Perforated sigmoid colon ([place]) 06/03/2015  . Diarrhea 05/28/2015  . Arthritis 05/20/2015  . Melanoma of skin ([place]) 05/20/2015  . Depression 05/20/2015    JP serosanguinous.  Stable.  Wait for flatus.   2 Days  Post-Op    LOS: 2 days   Matt B. Hassell Done, MD, [facility] [phone] beeper [phone]  07/17/2015 9:20 AM

## 2015-07-17 NOTE — Care Management Note (Signed)
Case Management Note  Patient Details  Name: [name] MRN: [id number] Date of Birth: 1961/06/13  Subjective/Objective:   Laparoscopy with open takedown of fistulae, resection of segment of sigmoid colon, and descending colostomy                  Action/Plan: Discharge planning, spoke with patient at bedside. States she wants to use [facility] services as she has used them in the past. Will d/c to same address in [place] ([address] [place] ). Will need ostomy care but unsure if will continue vac. Will continue to follow and apply for vac if requested.   Expected Discharge Date:                  Expected Discharge Plan:  [place]  In-House Referral:  NA  Discharge planning Services  CM Consult  Post Acute Care Choice:  Home Health Choice offered to:  Patient  DME Arranged:  Vac DME Agency:  [facility]  HH Arranged:  RN, Disease Management [place] Agency:  [place]  Status of Service:  Completed, signed off  Medicare Important Message Given:    Date Medicare IM Given:    Medicare IM give by:    Date Additional Medicare IM Given:    Additional Medicare Important Message give by:     If discussed at [place] of Stay Meetings, dates discussed:    Additional Comments:  Guadalupe Maple, RN 07/17/2015, 11:48 AM

## 2015-07-17 NOTE — Consult Note (Addendum)
WOC wound follow up Vac intact to midline abd post-op wound with good seal at 117mm cont suction.  Dr Hassell Done will order dressing change when desired.  Ostomy pouch was changed yesterday and teaching session was performed.  Pouch intact with good seal, no stool or flatus at this time. Stoma red and viable when visualized through pouch. Pt is able to open and close velcro to empty.  Briefly discussed pouching routines.  Pt asks appropriate questions.  Supplies at bedside for staff nurse use.  [place] team will continue to follow for further teaching sessions. Placed on [place] discharge program: Yes Julien Girt MSN, [place], [place], [place], [place]

## 2015-07-18 LAB — CBC WITH DIFFERENTIAL/PLATELET
BASOS PCT: 0 %
Basophils Absolute: 0 10*3/uL (ref 0.0–0.1)
EOS ABS: 0.3 10*3/uL (ref 0.0–0.7)
EOS PCT: 2 %
HCT: 25.6 % — ABNORMAL LOW (ref 36.0–46.0)
HEMOGLOBIN: 8.1 g/dL — AB (ref 12.0–15.0)
LYMPHS PCT: 19 %
Lymphs Abs: 2.6 10*3/uL (ref 0.7–4.0)
MCH: 29.5 pg (ref 26.0–34.0)
MCHC: 31.6 g/dL (ref 30.0–36.0)
MCV: 93.1 fL (ref 78.0–100.0)
MONO ABS: 1.3 10*3/uL — AB (ref 0.1–1.0)
Monocytes Relative: 9 %
NEUTROS ABS: 9.7 10*3/uL — AB (ref 1.7–7.7)
NEUTROS PCT: 70 %
PLATELETS: 644 10*3/uL — AB (ref 150–400)
RBC: 2.75 MIL/uL — ABNORMAL LOW (ref 3.87–5.11)
RDW: 16.9 % — ABNORMAL HIGH (ref 11.5–15.5)
WBC: 13.9 10*3/uL — AB (ref 4.0–10.5)

## 2015-07-18 MED ORDER — HYDROMORPHONE HCL 1 MG/ML IJ SOLN
1.0000 mg | INTRAMUSCULAR | Status: DC | PRN
  Administered 2015-07-18 – 2015-07-21 (×17): 1 mg via INTRAVENOUS
  Filled 2015-07-18 (×17): qty 1

## 2015-07-18 NOTE — Progress Notes (Signed)
Dr Hassell Done paged after patient stated her pain does not seem to get better than a 4 with current pain med.  New orders received patient made aware

## 2015-07-18 NOTE — Progress Notes (Signed)
Patient ID: [name], female   DOB: 08-05-1961, 54 y.o.   MRN: [id number] [facility] Surgery Progress Note:   3 Days Post-Op  Subjective: Mental status is clear.  Discussed path-perforated diverticulitis (no cancer).  Will get [facility] MD service to follow next week.  Objective: Vital signs in last 24 hours: Temp:  [97.6 F (36.4 C)-98.4 F (36.9 C)] 97.9 F (36.6 C) (10/28 0534) Pulse Rate:  [95-109] 108 (10/28 0534) Resp:  [18-20] 18 (10/28 0534) BP: (87-120)/(52-69) 107/69 mmHg (10/28 0534) SpO2:  [93 %-100 %] 94 % (10/28 0534)  Intake/Output from previous day: 10/27 0701 - 10/28 0700 In: 2325 [I.V.:2325] Out: 2000 [Urine:1875; Drains:125] Intake/Output this shift:    Physical Exam: Work of breathing is normal.  VAC in place-due for change.    Lab Results:  Results for orders placed or performed during the hospital encounter of 07/15/15 (from the past 48 hour(s))  CBC with Differential/Platelet     Status: Abnormal   Collection Time: 07/18/15  5:20 AM  Result Value Ref Range   WBC 13.9 (H) 4.0 - 10.5 K/uL   RBC 2.75 (L) 3.87 - 5.11 MIL/uL   Hemoglobin 8.1 (L) 12.0 - 15.0 g/dL   HCT 25.6 (L) 36.0 - 46.0 %   MCV 93.1 78.0 - 100.0 fL   MCH 29.5 26.0 - 34.0 pg   MCHC 31.6 30.0 - 36.0 g/dL   RDW 16.9 (H) 11.5 - 15.5 %   Platelets 644 (H) 150 - 400 K/uL   Neutrophils Relative % 70 %   Lymphocytes Relative 19 %   Monocytes Relative 9 %   Eosinophils Relative 2 %   Basophils Relative 0 %   Neutro Abs 9.7 (H) 1.7 - 7.7 K/uL   Lymphs Abs 2.6 0.7 - 4.0 K/uL   Monocytes Absolute 1.3 (H) 0.1 - 1.0 K/uL   Eosinophils Absolute 0.3 0.0 - 0.7 K/uL   Basophils Absolute 0.0 0.0 - 0.1 K/uL   WBC Morphology MILD LEFT SHIFT (1-5% METAS, OCC MYELO, OCC BANDS)     Radiology/Results: No results found.  Anti-infectives: Anti-infectives    Start     Dose/Rate Route Frequency Ordered Stop   07/15/15 2200  cefoTEtan (CEFOTAN) 2 g in dextrose 5 % 50 mL IVPB     2 g 100 mL/hr over  30 Minutes Intravenous Every 12 hours 07/15/15 1915 07/15/15 2251   07/15/15 1158  cefoTEtan (CEFOTAN) 2 g in dextrose 5 % 50 mL IVPB     2 g 100 mL/hr over 30 Minutes Intravenous On call to O.R. 07/15/15 1158 07/15/15 1520      Assessment/Plan: Problem List: Patient Active Problem List   Diagnosis Date Noted  . Colonic diverticular abscess   . 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 Large bowel perforation ([place])   . Palliative care encounter 06/06/2015  . DNR (do not resuscitate) 06/06/2015  . Colovaginal fistula   . Colovesical fistula   . Pelvic abscess in female   . Malnutrition of moderate degree ([place]) 06/04/2015  . Perforated sigmoid colon ([place]) 06/03/2015  . Diarrhea 05/28/2015  . Arthritis 05/20/2015  . Melanoma of skin ([place]) 05/20/2015  . Depression 05/20/2015    WBC down and Hg to 8.  Will get VAC changed.  Flatus and liquid in ostomy bag.  Will begin clear liquids.   3 Days Post-Op    LOS: 3 days   Matt B. Hassell Done, MD, [facility] [phone] beeper  [phone]  07/18/2015 7:32 AM

## 2015-07-18 NOTE — Consult Note (Signed)
WOC wound consult note Reason for Consult:NPWT (VAC) dressing change to midline abdominal surgical wound.  POuch change to LLQ Colostomy Wound type:Surgical incision, midline Pressure Ulcer POA: N/A Measurement:8 cm x 3.4 cm x 2.2 cm  Wound TKK:OECXF red, nongranulating tissue, adipose tissue noted.  Drainage (amount, consistency, odor) Minimal serosanguinous drainage in canister.  No odor.  Periwound:Intact.  LLQ Colostomy 6 cm left will keep the two separate.  Dressing procedure/placement/frequency:Cleanse abdominal wound with NS and pat dry.  Gently fill with black Granufoam (used 2 pieces today).  COver with drape.  Seal immediately achieved at 166mmHg.  Change Mon/Wed/Fri.  [place] team will continue to follow.  Domenic Moras RN BSN [facility] Pager [phone]  [place] ostomy consult note Stoma type/location: LLQ COlostomy Stomal assessment/size: 1 1/8", slightly oval.  Slightly budded.  Pink patent and producing brown liquid stool.  Peristomal assessment: Creasing noted at 2 o'clock.  Will add barrier ring to 1 piece flat pouch.  Treatment options for stomal/peristomal skin: Barrier ring.  1 piece flat pouch for added flexibility.  Output Liquid brown stool Ostomy pouching: 1pc. Flat Barrier ring Education provided: Discussed principles of self care, cleansing, pouch change frequency and cutting pouch off center to accommodate NPWT (VAC) dressing.  Able to roll pouch closed.  Understands to close when 1/3 full.   Enrolled patient in [place] program: Yes [place] team will continue to follow.   Domenic Moras RN BSN [place] Pager [phone]

## 2015-07-18 NOTE — Plan of Care (Signed)
Problem: Phase I Progression Outcomes Goal: Voiding-avoid urinary catheter unless indicated Outcome: Not Applicable Date Met:  75/42/37 Patient to discharge with foley

## 2015-07-19 LAB — CBC WITH DIFFERENTIAL/PLATELET
BASOS PCT: 1 %
Basophils Absolute: 0.1 10*3/uL (ref 0.0–0.1)
EOS PCT: 2 %
Eosinophils Absolute: 0.3 10*3/uL (ref 0.0–0.7)
HCT: 26 % — ABNORMAL LOW (ref 36.0–46.0)
Hemoglobin: 8.2 g/dL — ABNORMAL LOW (ref 12.0–15.0)
LYMPHS ABS: 2.2 10*3/uL (ref 0.7–4.0)
Lymphocytes Relative: 17 %
MCH: 29 pg (ref 26.0–34.0)
MCHC: 31.5 g/dL (ref 30.0–36.0)
MCV: 91.9 fL (ref 78.0–100.0)
MONO ABS: 1.3 10*3/uL — AB (ref 0.1–1.0)
MONOS PCT: 10 %
Neutro Abs: 9 10*3/uL — ABNORMAL HIGH (ref 1.7–7.7)
Neutrophils Relative %: 70 %
PLATELETS: 639 10*3/uL — AB (ref 150–400)
RBC: 2.83 MIL/uL — AB (ref 3.87–5.11)
RDW: 16.7 % — AB (ref 11.5–15.5)
WBC: 12.9 10*3/uL — AB (ref 4.0–10.5)

## 2015-07-19 LAB — BASIC METABOLIC PANEL
ANION GAP: 5 (ref 5–15)
CALCIUM: 8.4 mg/dL — AB (ref 8.9–10.3)
CO2: 32 mmol/L (ref 22–32)
Chloride: 101 mmol/L (ref 101–111)
Creatinine, Ser: 0.34 mg/dL — ABNORMAL LOW (ref 0.44–1.00)
GFR calc Af Amer: >60 mL/min (ref >60)
Glucose, Bld: 106 mg/dL — ABNORMAL HIGH (ref 65–99)
Potassium: 4.2 mmol/L (ref 3.5–5.1)
SODIUM: 138 mmol/L (ref 135–145)

## 2015-07-19 NOTE — Progress Notes (Signed)
General Surgery Note  LOS: 4 days  POD -  4 Days Post-Op  Assessment/Plan: 1.  LAPAROSCOPIC ASSISTED SIGMOID COLECTOMY WITH COLOSTOMY, HARTMANN PROCEDURE, WITH PLACEMENT OF WOUND VAC - 07/14/2014 Hassell Done  For diverticulitis with colovesical fistula and colovaginal fistula  Tolerated clr liquids, will advance to reg diet  2.  Quit smoking in Sept 2016 3.  History of melanoma 4.  DVT prophylaxis - SQ Heparin 5.  Anemia - Hgb - 8.2 - 07/09/2015   Active Problems:   Colovaginal fistula   Subjective:  Doing better.  She had a lot complaints about the staff not getting her out of bed and not giving her pain meds.  Her mother was in the room and said the same thing. Objective:   Filed Vitals:   07/19/15 0504  BP: 126/79  Pulse: 94  Temp: 98.3 F (36.8 C)  Resp: 18     Intake/Output from previous day:  10/28 0701 - 10/29 0700 In: 2512.9 [P.O.:660; I.V.:1852.9] Out: 2490 [Urine:2350; Drains:140]  Intake/Output this shift:  Total I/O In: 240 [P.O.:240] Out: 400 [Urine:400]   Physical Exam:   General: WN WF who is alert and oriented.    HEENT: Normal. Pupils equal. .   Lungs: Clear.   Abdomen: Soft with rare BS.   Wound: Colostomy LLQ - okay Drain - RLQ - 140 cc recorded the last 24 hours.   Lab Results:    Recent Labs  07/18/15 0520 07/19/15 0545  WBC 13.9* 12.9*  HGB 8.1* 8.2*  HCT 25.6* 26.0*  PLT 644* 639*    BMET   Recent Labs  07/19/15 0545  NA 138  K 4.2  CL 101  CO2 32  GLUCOSE 106*  BUN <5*  CREATININE 0.34*  CALCIUM 8.4*    PT/INR  No results for input(s): LABPROT, INR in the last 72 hours.  ABG  No results for input(s): PHART, HCO3 in the last 72 hours.  Invalid input(s): PCO2, PO2   Studies/Results:  No results found.   Anti-infectives:   Anti-infectives    Start     Dose/Rate Route Frequency Ordered Stop   07/15/15 2200  cefoTEtan (CEFOTAN) 2 g in dextrose 5 % 50 mL IVPB     2 g 100 mL/hr over 30 Minutes Intravenous Every 12  hours 07/15/15 1915 07/15/15 2251   07/15/15 1158  cefoTEtan (CEFOTAN) 2 g in dextrose 5 % 50 mL IVPB     2 g 100 mL/hr over 30 Minutes Intravenous On call to O.R. 07/15/15 1158 07/15/15 1520      Alphonsa Overall, MD, FACS Pager: [place] [facility] Office: [phone] 07/19/2015

## 2015-07-20 LAB — CBC WITH DIFFERENTIAL/PLATELET
BASOS ABS: 0.1 10*3/uL (ref 0.0–0.1)
Basophils Relative: 1 %
EOS PCT: 2 %
Eosinophils Absolute: 0.2 10*3/uL (ref 0.0–0.7)
HEMATOCRIT: 26.5 % — AB (ref 36.0–46.0)
Hemoglobin: 8.5 g/dL — ABNORMAL LOW (ref 12.0–15.0)
LYMPHS PCT: 20 %
Lymphs Abs: 2.5 10*3/uL (ref 0.7–4.0)
MCH: 29.3 pg (ref 26.0–34.0)
MCHC: 32.1 g/dL (ref 30.0–36.0)
MCV: 91.4 fL (ref 78.0–100.0)
MONOS PCT: 9 %
Monocytes Absolute: 1.1 10*3/uL — ABNORMAL HIGH (ref 0.1–1.0)
NEUTROS ABS: 8.4 10*3/uL — AB (ref 1.7–7.7)
Neutrophils Relative %: 68 %
PLATELETS: 645 10*3/uL — AB (ref 150–400)
RBC: 2.9 MIL/uL — ABNORMAL LOW (ref 3.87–5.11)
RDW: 17 % — AB (ref 11.5–15.5)
WBC: 12.3 10*3/uL — ABNORMAL HIGH (ref 4.0–10.5)

## 2015-07-20 MED ORDER — OXYCODONE HCL 5 MG PO TABS
10.0000 mg | ORAL_TABLET | ORAL | Status: DC | PRN
  Administered 2015-07-20 – 2015-07-21 (×5): 10 mg via ORAL
  Filled 2015-07-20 (×5): qty 2

## 2015-07-20 NOTE — Progress Notes (Signed)
5 Days Post-Op  Subjective: tol diet, pain controlled, hasnt walked  Objective: Vital signs in last 24 hours: Temp:  [97.6 F (36.4 C)-98.7 F (37.1 C)] 97.8 F (36.6 C) (10/30 0600) Pulse Rate:  [92-98] 93 (10/30 0600) Resp:  [16-18] 18 (10/30 0600) BP: (105-155)/(68-82) 155/82 mmHg (10/30 0600) SpO2:  [95 %-99 %] 99 % (10/30 0600) Last BM Date: 07/19/15  Intake/Output from previous day: 10/29 0701 - 10/30 0700 In: 2280 [P.O.:480; I.V.:1800] Out: 3425 [Urine:3400; Drains:25] Intake/Output this shift:    General appearance: no distress Resp: clear to auscultation bilaterally Cardio: regular rate and rhythm GI: soft nt/nd bs present vac in place stoma functional  Lab Results:   Recent Labs  07/19/15 0545 07/20/15 0546  WBC 12.9* 12.3*  HGB 8.2* 8.5*  HCT 26.0* 26.5*  PLT 639* 645*   BMET  Recent Labs  07/19/15 0545  NA 138  K 4.2  CL 101  CO2 32  GLUCOSE 106*  BUN <5*  CREATININE 0.34*  CALCIUM 8.4*   PT/INR No results for input(s): LABPROT, INR in the last 72 hours. ABG No results for input(s): PHART, HCO3 in the last 72 hours.  Invalid input(s): PCO2, PO2  Studies/Results: No results found.  Anti-infectives: Anti-infectives    Start     Dose/Rate Route Frequency Ordered Stop   07/15/15 2200  cefoTEtan (CEFOTAN) 2 g in dextrose 5 % 50 mL IVPB     2 g 100 mL/hr over 30 Minutes Intravenous Every 12 hours 07/15/15 1915 07/15/15 2251   07/15/15 1158  cefoTEtan (CEFOTAN) 2 g in dextrose 5 % 50 mL IVPB     2 g 100 mL/hr over 30 Minutes Intravenous On call to O.R. 07/15/15 1158 07/15/15 1520      Assessment/Plan: POD 4 LAPAROSCOPIC ASSISTED SIGMOID COLECTOMY WITH COLOSTOMY, HARTMANN PROCEDURE, WITH PLACEMENT OF WOUND VAC  1. Add oral pain meds to iv today 2. Regular diet 3. Wound vac tomorrow 4. pulm toilet, needs to be oob more 5. Hopefully home next 24 hours 6. Sq heparin 7. Continue foley  [name] 07/20/2015

## 2015-07-21 LAB — BASIC METABOLIC PANEL
Anion gap: 4 — ABNORMAL LOW (ref 5–15)
CHLORIDE: 103 mmol/L (ref 101–111)
CO2: 32 mmol/L (ref 22–32)
CREATININE: 0.48 mg/dL (ref 0.44–1.00)
Calcium: 8.7 mg/dL — ABNORMAL LOW (ref 8.9–10.3)
GFR calc Af Amer: >60 mL/min (ref >60)
GFR calc non Af Amer: >60 mL/min (ref >60)
Glucose, Bld: 110 mg/dL — ABNORMAL HIGH (ref 65–99)
POTASSIUM: 4 mmol/L (ref 3.5–5.1)
Sodium: 139 mmol/L (ref 135–145)

## 2015-07-21 LAB — CBC
HEMATOCRIT: 25.8 % — AB (ref 36.0–46.0)
HEMOGLOBIN: 8.4 g/dL — AB (ref 12.0–15.0)
MCH: 30.2 pg (ref 26.0–34.0)
MCHC: 32.6 g/dL (ref 30.0–36.0)
MCV: 92.8 fL (ref 78.0–100.0)
Platelets: 605 10*3/uL — ABNORMAL HIGH (ref 150–400)
RBC: 2.78 MIL/uL — AB (ref 3.87–5.11)
RDW: 17.7 % — ABNORMAL HIGH (ref 11.5–15.5)
WBC: 11.7 10*3/uL — AB (ref 4.0–10.5)

## 2015-07-21 MED ORDER — FERROUS SULFATE 325 (65 FE) MG PO TABS
325.0000 mg | ORAL_TABLET | Freq: Every day | ORAL | Status: DC
  Administered 2015-07-21: 325 mg via ORAL
  Filled 2015-07-21: qty 1

## 2015-07-21 MED ORDER — FERROUS SULFATE 325 (65 FE) MG PO TABS: ORAL_TABLET | ORAL | Status: DC

## 2015-07-21 MED ORDER — OXYCODONE HCL 5 MG PO TABS
5.0000 mg | ORAL_TABLET | ORAL | Status: DC | PRN
  Administered 2015-07-21 (×2): 15 mg via ORAL
  Filled 2015-07-21 (×2): qty 3

## 2015-07-21 MED ORDER — OXYCODONE HCL 5 MG PO TABS: 5.0000 mg | ORAL_TABLET | ORAL | Status: DC | PRN

## 2015-07-21 MED ORDER — HYDROMORPHONE HCL 1 MG/ML IJ SOLN: 1.0000 mg | INTRAMUSCULAR | Status: DC | PRN

## 2015-07-21 MED ORDER — ACETAMINOPHEN 500 MG PO TABS
1000.0000 mg | ORAL_TABLET | Freq: Four times a day (QID) | ORAL | Status: DC
Start: 2015-07-21 — End: 2017-02-18

## 2015-07-21 MED ORDER — ACETAMINOPHEN 500 MG PO TABS
1000.0000 mg | ORAL_TABLET | Freq: Four times a day (QID) | ORAL | Status: DC
  Administered 2015-07-21 (×2): 1000 mg via ORAL
  Filled 2015-07-21 (×2): qty 2

## 2015-07-21 NOTE — Progress Notes (Signed)
Dalbert Batman, MD arrived on unit and saw patient. Verbally told RN to D/C JP drain. Drain removed. Pt tolerated well. Gauze and tape applied over site.   Roselind Rily

## 2015-07-21 NOTE — Progress Notes (Signed)
   Wound, awaiting home health approval of wound vac.    Ostomy site, pt learning how to use again today.

## 2015-07-21 NOTE — Progress Notes (Signed)
Wound vac authorization faxed to [facility]. Awaiting call back to arrange delivery.

## 2015-07-21 NOTE — Progress Notes (Signed)
Patient alert and oriented. Discharge instructions given. Patient comfortable with drains and ostomy care. Prescriptions given to patient. All questions answered. Wound vac present prior to departure.

## 2015-07-21 NOTE — Progress Notes (Signed)
6 Days Post-Op  Subjective: She seems to be doing well, she has been taking both PO and IV pain meds.  Tolerating diet.    Objective: Vital signs in last 24 hours: Temp:  [97.7 F (36.5 C)-98.1 F (36.7 C)] 97.7 F (36.5 C) (10/31 0539) Pulse Rate:  [90-103] 90 (10/31 0539) Resp:  [16-18] 18 (10/31 0539) BP: (105-121)/(57-74) 121/67 mmHg (10/31 0539) SpO2:  [97 %-98 %] 98 % (10/31 0539) Last BM Date: 07/20/15 PO 480 Regular diet Drains 102 Stool 20 recorded Afebrile, VSS WBC down to 11. 7 H/H is down but stable at 8.4 BMP OK Intake/Output from previous day: 10/30 0701 - 10/31 0700 In: 1738.3 [P.O.:480; I.V.:1258.3] Out: 2100 [Urine:1975; Drains:105; Stool:20] Intake/Output this shift:    General appearance: alert, cooperative and no distress Resp: clear to auscultation bilaterally Abdomen:  Tender, wound vac in place, stool in ostomy, but not very much.  Foley in place.  Drain is out.   Lab Results:   Recent Labs  07/20/15 0546 07/21/15 0003  WBC 12.3* 11.7*  HGB 8.5* 8.4*  HCT 26.5* 25.8*  PLT 645* 605*    BMET  Recent Labs  07/19/15 0545 07/21/15 0003  NA 138 139  K 4.2 4.0  CL 101 103  CO2 32 32  GLUCOSE 106* 110*  BUN <5* <5*  CREATININE 0.34* 0.48  CALCIUM 8.4* 8.7*   PT/INR No results for input(s): LABPROT, INR in the last 72 hours.  No results for input(s): AST, ALT, ALKPHOS, BILITOT, PROT, ALBUMIN in the last 168 hours.   Lipase  No results found for: LIPASE   Studies/Results: No results found.  Medications: . antiseptic oral rinse  7 mL Mouth Rinse q12n4p  . chlorhexidine  15 mL Mouth Rinse BID  . DULoxetine  60 mg Oral Daily  . heparin subcutaneous  5,000 Units Subcutaneous 3 times per day  . saccharomyces boulardii  250 mg Oral BID   . dextrose 5 % and 0.45 % NaCl with KCl 20 mEq/L 50 mL/hr at 07/21/15 0314   Prior to Admission medications   Medication Sig Start Date End Date Taking? Authorizing Provider  DULoxetine  (CYMBALTA) 60 MG capsule Take 60 mg by mouth daily.   Yes Historical Provider, MD  HYDROcodone-acetaminophen (NORCO/VICODIN) 5-325 MG per tablet Take 1 tablet by mouth every 6 (six) hours as needed for moderate pain. 06/09/15  Yes Mickeal Skinner, MD  Prenatal Vit-Fe Fumarate-FA (PRENATAL MULTIVITAMIN) TABS tablet Take 1 tablet by mouth daily at 12 noon.   Yes Historical Provider, MD  saccharomyces boulardii (FLORASTOR) 250 MG capsule Take 1 capsule (250 mg total) by mouth 2 (two) times daily. 06/09/15  Yes Nat Christen, PA-C  ampicillin (PRINCIPEN) 250 MG capsule Take 1 capsule (250 mg total) by mouth 3 (three) times daily. 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  07/16/15, Dr. Johnathan Hausen. Tobacco use  Quit September 2016,  Anemia   Hx of Melanoma Hx of depression Antibiotics:  None DVT:  Heparin/SCD  Plan:  Our plan is to let her go home today, but still working out issues of home health and pain management.   She should have her wound vac changed here today, I have signed order  for wound vac for home and home health.  I am going to try her on continuous tylenol and oxycodone prn for pain.   Will follow up with her this afternoon. Case manager is working on all the home health stuff this AM.        LOS: 6 days    Jahseh Lucchese 07/21/2015

## 2015-07-21 NOTE — Progress Notes (Signed)
Changed dressing to portable wound vac.  Patient teaching done on portable wound vac and told to call home health with any questions.  Patient left via wheelchair in stable condition.  Patient was with her mother and nurse tech. All supplies were sent home and transported to her car.

## 2015-07-21 NOTE — Discharge Instructions (Signed)
[facility]      [facility] °[phone] ° °OPEN ABDOMINAL SURGERY: POST OP INSTRUCTIONS ° °Always review your discharge instruction sheet given to you by the facility where your surgery was performed. ° °IF YOU HAVE DISABILITY OR FAMILY LEAVE FORMS, YOU MUST BRING THEM TO THE OFFICE FOR PROCESSING.  PLEASE DO NOT GIVE THEM TO YOUR DOCTOR. ° °1. A prescription for pain medication may be given to you upon discharge.  Take your pain medication as prescribed, if needed.  If narcotic pain medicine is not needed, then you may take acetaminophen (Tylenol) or ibuprofen (Advil) as needed. °2. Take your usually prescribed medications unless otherwise directed. °3. If you need a refill on your pain medication, please contact your pharmacy. They will contact our office to request authorization.  Prescriptions will not be filled after 5pm or on week-ends. °4. You should follow a light diet the first few days after arrival home, such as soup and crackers, pudding, etc.unless your doctor has advised otherwise. A high-fiber, low fat diet can be resumed as tolerated.   Be sure to include lots of fluids daily. Most patients will experience some swelling and bruising on the chest and neck area.  Ice packs will help.  Swelling and bruising can take several days to resolve °5. Most patients will experience some swelling and bruising in the area of the incision. Ice pack will help. Swelling and bruising can take several days to resolve..  °6. It is common to experience some constipation if taking pain medication after surgery.  Increasing fluid intake and taking a stool softener will usually help or prevent this problem from occurring.  A mild laxative (Milk of Magnesia or Miralax) should be taken according to package directions if there are no bowel movements after 48 hours. °7.  You may have steri-strips (small skin tapes) in place directly over the incision.  These strips should be left on the skin for 7-10 days.  If your  surgeon used skin glue on the incision, you may shower in 24 hours.  The glue will flake off over the next 2-3 weeks.  Any sutures or staples will be removed at the office during your follow-up visit. You may find that a light gauze bandage over your incision may keep your staples from being rubbed or pulled. You may shower and replace the bandage daily. °8. ACTIVITIES:  You may resume regular (light) daily activities beginning the next day--such as daily self-care, walking, climbing stairs--gradually increasing activities as tolerated.  You may have sexual intercourse when it is comfortable.  Refrain from any heavy lifting or straining until approved by your doctor. °a. You may drive when you no longer are taking prescription pain medication, you can comfortably wear a seatbelt, and you can safely maneuver your car and apply brakes °b. Return to Work: ___________________________________ °9. You should see your doctor in the office for a follow-up appointment approximately two weeks after your surgery.  Make sure that you call for this appointment within a day or two after you arrive home to insure a convenient appointment time. °OTHER INSTRUCTIONS:  °_____________________________________________________________ °_____________________________________________________________ ° °WHEN TO CALL YOUR DOCTOR: °1. Fever over 101.0 °2. Inability to urinate °3. Nausea and/or vomiting °4. Extreme swelling or bruising °5. Continued bleeding from incision. °6. Increased pain, redness, or drainage from the incision. °7. Difficulty swallowing or breathing °8. Muscle cramping or spasms. °9. Numbness or tingling in hands or feet or around lips. ° °The clinic staff is available to   answer your questions during regular business hours.  Please dont hesitate to call and ask to speak to one of the nurses if you have concerns.  For further questions, please visit [email or web address]  Colostomy Home Guide A colostomy is an  opening for stool to leave your body when a medical condition prevents it from leaving through the usual opening (rectum). During a surgery, a piece of large intestine (colon) is brought through a hole in the abdominal wall. The new opening is called a stoma or ostomy. A bag or pouch fits over the stoma to catch stool and gas. Your stool may be liquid, somewhat pasty, or formed. CARING FOR YOUR STOMA  Normally, the stoma looks a lot like the inside of your cheek: pink, red, and moist. At first it may be swollen, but this swelling will decrease within 6 weeks. Keep the skin around your stoma clean and dry. You can gently wash your stoma and the skin around your stoma in the shower with a clean, soft washcloth. If you develop any skin irritation, your caregiver may give you a stoma powder or ointment to help heal the area. Do not use any products other than those specifically given to you by your caregiver.  Your stoma should not be uncomfortable. If you notice any stinging or burning, your pouch may be leaking, and the skin around your stoma may be coming into contact with stool. This can cause skin irritation. If you notice stinging, replace your pouch with a new one and discard the old one. OSTOMY POUCHES  The pouch that fits over the ostomy can be made up of either 1 or 2 pieces. A one-piece pouch has a skin barrier piece and the pouch itself in one unit. A two-piece pouch has a skin barrier with a separate pouch that snaps on and off of the skin barrier. Either way, you should empty the pouch when it is only  to  full. Do not let more stool or gas build up. This could cause the pouch to leak. Some ostomy bags have a built-in gas release valve. Ostomy deodorizer (5 drops) can be put into the pouch to prevent odor. Some people use ostomy lubricant drops inside the pouch to help the stool slide out of the bag more easily and completely.  EMPTYING YOUR OSTOMY POUCH  You may get lessons on how to empty your  pouch from a wound-ostomy nurse before you leave the hospital. Here are the basic steps: 1. Wash your hands with soap and water. 2. Sit far back on the toilet. 3. Put several pieces of toilet paper into the toilet water. This will prevent splashing as you empty the stool into the toilet bowl. 4. Unclip or unvelcro the tail end of the pouch. 5. Unroll the tail and empty stool into the toilet. 6. Clean the tail with toilet paper. 7. Reroll the tail, and clip or velcro it closed. 8. Wash your hands again. CHANGING YOUR OSTOMY POUCH  Change your ostomy pouch about every 3 to 4 days for the first 6 weeks, then every 5 to7 days. Always change the bag sooner if there is any leakage or you begin to notice any discomfort or irritation of the skin around the stoma. When possible, plan to change your ostomy pouch before eating or drinking as this will lessen the chance of stool coming out during the pouch change. A wound-ostomy nurse may teach you how to change your pouch before you leave the hospital. Here are  the basic steps: 1. Lay out your supplies. 2. Wash your hands with soap and water. 3. Carefully remove the old pouch. 4. Wash the stoma and allow it to dry. Men may be advised to shave any hair around the stoma very carefully. This will make the adhesive stick better. 5. Use the stoma measuring guide that comes with your pouch set to decide what size hole you will need to cut in the skin barrier piece. Choose the smallest possible size that will hold the stoma but will not touch it. 6. Use the guide to trace the circle on the back of the skin barrier piece. Cut out the hole. 7. Hold the skin barrier piece over the stoma to make sure the hole is the correct size. 8. Remove the adhesive paper backing from the skin barrier piece. 9. Squeeze stoma paste around the opening of the skin barrier piece. 10. Clean and dry the skin around the stoma again. 11. Carefully fit the skin barrier piece over your  stoma. 12. If you are using a two-piece pouch, snap the pouch onto the skin barrier piece. 13. Close the tail of the pouch. 14. Put your hand over the top of the skin barrier piece to help warm it for about 5 minutes, so that it conforms to your body better. 15. Wash your hands again. DIET TIPS  1. Continue to follow your usual diet. 2. Drink about eight 8 oz glasses of water each day. 3. You can prevent gas by eating slowly and chewing your food thoroughly. 4. If you feel concerned that you have too much gas, you can cut back on gas-producing foods, such as: 1. Spicy foods. 2. Onions and garlic. 3. Cruciferous vegetables (cabbage, broccoli, cauliflower, Brussels sprouts). 4. Beans and legumes. 5. Some cheeses. 6. Eggs. 7. Fish. 8. Bubbly (carbonated) drinks. 9. Chewing gum. GENERAL TIPS  1. You can shower with or without the bag in place. 2. Always keep the bag on if you are bathing or swimming. 3. If your bag gets wet, you can dry it with a blow-dryer set to cool. 4. Avoid wearing tight clothing directly over your stoma so that it does not become irritated or bleed. Tight clothing can also prevent stool from draining into the pouch. 5. It is helpful to always have an extra skin barrier and pouch with you when traveling. Do not leave them anywhere too warm, as parts of them can melt. 6. Do not let your seat belt rest on your stoma. Try to keep the seat belt either above or below your stoma, or use a tiny pillow to cushion it. 7. You can still participate in sports, but you should avoid activities in which there is a risk of getting hit in the abdomen. 8. You can still have sex. It is a good idea to empty your pouch prior to sex. Some people and their partners feel very comfortable seeing the pouch during sex. Others choose to wear lingerie or a T-shirt that covers the device. SEEK IMMEDIATE MEDICAL CARE IF:  You notice a change in the size or color of the stoma, especially if it  becomes very red, purple, black, or pale white.  You have bloody stools or bleeding from the stoma.  You have abdominal pain, nausea, vomiting, or bloating.  There is anything unusual protruding from the stoma.  You have irritation or red skin around the stoma.  No stool is passing from the stoma.  You have diarrhea (requiring more frequent  than normal pouch emptying).   This information is not intended to replace advice given to you by your health care provider. Make sure you discuss any questions you have with your health care provider.   Document Released: 09/09/2003 Document Revised: 11/29/2011 Document Reviewed: 02/03/2011 Elsevier Interactive Patient Education 2016 [place], Adult A Foley catheter is a soft, flexible tube. This tube is placed into your bladder to drain pee (urine). If you go home with this catheter in place, follow the instructions below. TAKING CARE OF THE CATHETER 9. Wash your hands with soap and water. 10. Put soap and water on a clean washcloth.  Clean the skin where the tube goes into your body.  Clean away from the tube site.  Never wipe toward the tube.  Clean the area using a circular motion.  Remove all the soap. Pat the area dry with a clean towel. For males, reposition the skin that covers the end of the penis (foreskin). 11. Attach the tube to your leg with tape or a leg strap. Do not stretch the tube tight. If you are using tape, remove any stickiness left behind by past tape you used. 12. Keep the drainage bag below your hips. Keep it off the floor. 13. Check your tube during the day. Make sure it is working and draining. Make sure the tube does not curl, twist, or bend. 14. Do not pull on the tube or try to take it out. TAKING CARE OF THE DRAINAGE BAGS You will have a large overnight drainage bag and a small leg bag. You may wear the overnight bag any time. Never wear the small bag at night. Follow the directions  below. Emptying the Drainage Bag Empty your drainage bag when it is  - full or at least 2-3 times a day. 65. Wash your hands with soap and water. 17. Keep the drainage bag below your hips. 18. Hold the dirty bag over the toilet or clean container. 19. Open the pour spout at the bottom of the bag. Empty the pee into the toilet or container. Do not let the pour spout touch anything. 20. Clean the pour spout with a gauze pad or cotton ball that has rubbing alcohol on it. 21. Close the pour spout. 22. Attach the bag to your leg with tape or a leg strap. 23. Wash your hands well. Changing the Drainage Bag Change your bag once a month or sooner if it starts to smell or look dirty.  5. Wash your hands with soap and water. 6. Pinch the rubber tube so that pee does not spill out. 7. Disconnect the catheter tube from the drainage tube at the connection valve. Do not let the tubes touch anything. 8. Clean the end of the catheter tube with an alcohol wipe. Clean the end of a the drainage tube with a different alcohol wipe. 9. Connect the catheter tube to the drainage tube of the clean drainage bag. 10. Attach the new bag to the leg with tape or a leg strap. Avoid attaching the new bag too tightly. 11. Wash your hands well. Cleaning the Drainage Bag 9. Wash your hands with soap and water. 10. Wash the bag in warm, soapy water. 11. Rinse the bag with warm water. 12. Fill the bag with a mixture of white vinegar and water (1 cup vinegar to 1 quart warm water [.2 liter vinegar to 1 liter warm water]). Close the bag and soak it for 30 minutes in  the solution. 13. Rinse the bag with warm water. Crook the bag to dry with the pour spout open and hanging downward. 15. Store the clean bag (once it is dry) in a clean plastic bag. 16. Wash your hands well. PREVENT INFECTION  Wash your hands before and after touching your tube.  Take showers every day. Wash the skin where the tube enters your body. Do not  take baths. Replace wet leg straps with dry ones, if this applies.  Do not use powders, sprays, or lotions on the genital area. Only use creams, lotions, or ointments as told by your doctor.  For females, wipe from front to back after going to the bathroom.  Drink enough fluids to keep your pee clear or pale yellow unless you are told not to have too much fluid (fluid restriction).  Do not let the drainage bag or tubing touch or lie on the floor.  Wear cotton underwear to keep the area dry. GET HELP IF:  Your pee is cloudy or smells unusually bad.  Your tube becomes clogged.  You are not draining pee into the bag or your bladder feels full.  Your tube starts to leak. GET HELP RIGHT AWAY IF:  You have pain, puffiness (swelling), redness, or yellowish-white fluid (pus) where the tube enters the body.  You have pain in the belly (abdomen), legs, lower back, or bladder.  You have a fever.  You see blood fill the tube, or your pee is pink or red.  You feel sick to your stomach (nauseous), throw up (vomit), or have chills.  Your tube gets pulled out. MAKE SURE YOU:   Understand these instructions.  Will watch your condition.  Will get help right away if you are not doing well or get worse.   This information is not intended to replace advice given to you by your health care provider. Make sure you discuss any questions you have with your health care provider.   Document Released: 01/01/2013 Document Revised: 09/27/2014 Document Reviewed: 01/01/2013 Elsevier Interactive Patient Education [employer].

## 2015-07-21 NOTE — Discharge Summary (Signed)
Physician Discharge Summary  Patient ID: [name] MRN: [id number] DOB/AGE: 05/20/1961 54 y.o.  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 (hosptialized 9/13-9/19) Tobacco use Quit September 2016,  Anemia  Hx of Melanoma Hx of depression   Active Problems:   Colovaginal fistula   PROCEDURES:  Laparoscopy with open takedown of fistulae, resection of segment of sigmoid colon, and descending colostomy (Hartmann procedure) with Prolene suture sutured to the distal stump. 07/16/15, Dr. Johnathan Hausen.  Hospital Course:  [name] is an 54 y.o. female who presented to [facility] on Sept 13- June 09, 2015, with a one month history of diarrhea and recent feces per vagina. She was treated with antibiotics and IR percutaneous drainage of peri colonic abscesses 06/04/15;   thought secondary to diverticulitis or possibly IBD. She did not tolerate the percutaneous drains so well at home and she has been followed by Dr Hassell Done in the office. She is admitted for surgical repair and is aware that she may require a colostomy. She was admitted and taken to the OR on 07/16/15.  She has done well post op and Dr. Hassell Done placed her on our service while he was out of town.  She was seen on 10/29 by Dr. Lucia Gaskins and on 07/20/15, by DR. Wakefield.  She is making good progress.  She is going to go home with a wound vac, foley and colostomy.  Home health is being arranged, along with follow up next week.  We currently have her on Tylenol q6h and prn oxycodone IR. She is being trained in care of her foley, and ostomy.  Home health is being arranged for her care and follow up at home also.   Follow up with our office on  08/01/15 @5  PM    Open wound, awaiting Home health approval for wound vac.    Ostomy 07/21/15.  CBC Latest Ref Rng 07/21/2015 07/20/2015 07/19/2015  WBC 4.0 - 10.5 K/uL 11.7(H) 12.3(H) 12.9(H)  Hemoglobin 12.0 - 15.0 g/dL 8.4(L) 8.5(L) 8.2(L)  Hematocrit 36.0 - 46.0 % 25.8(L) 26.5(L) 26.0(L)  Platelets 150 - 400 K/uL 605(H) 645(H) 639(H)   CMP Latest Ref Rng 07/21/2015 07/19/2015 07/16/2015  Glucose 65 - 99 mg/dL 110(H) 106(H) 163(H)  BUN 6 - 20 mg/dL <5(L) <5(L) <5(L)  Creatinine 0.44 - 1.00 mg/dL 0.48 0.34(L) 0.62  Sodium 135 - 145 mmol/L 139 138 136  Potassium 3.5 - 5.1 mmol/L 4.0 4.2 3.2(L)  Chloride 101 - 111 mmol/L 103 101 102  CO2 22 - 32 mmol/L 32 32 27  Calcium 8.9 - 10.3 mg/dL 8.7(L) 8.4(L) 8.3(L)  Total Protein 6.5 - 8.1 g/dL - - -  Total Bilirubin 0.3 - 1.2 mg/dL - - -  Alkaline Phos 38 - 126 U/L - - -  AST 15 - 41 U/L - - -  ALT 14 - 54 U/L - - -     Condition on d/c:  Improved    Disposition: 06-Home-Health Care Svc     Medication List    STOP taking these medications        ampicillin 250 MG capsule  Commonly known as:  PRINCIPEN     HYDROcodone-acetaminophen 5-325 MG tablet  Commonly known as:  NORCO/VICODIN      TAKE these medications  acetaminophen 500 MG tablet  Commonly known as:  TYLENOL  Take 2 tablets (1,000 mg total) by mouth every 6 (six) hours.     DULoxetine 60 MG capsule  Commonly known as:  CYMBALTA  Take 60 mg by mouth daily.     ferrous sulfate 325 (65 FE) MG tablet  You can buy this over the counter without a prescription.  Take one daily with supper after your bowel movements are more regular.  Start around 07/26/15.     oxyCODONE 5 MG immediate release tablet  Commonly known as:  Oxy IR/ROXICODONE  Take 1-3 tablets (5-15 mg total) by mouth every 4 (four) hours as needed for moderate pain.     prenatal multivitamin Tabs tablet  Take 1 tablet by mouth daily at 12 noon.     saccharomyces boulardii 250 MG capsule   Commonly known as:  FLORASTOR  Take 1 capsule (250 mg total) by mouth 2 (two) times daily.       Follow-up Information    Follow up with Johnathan Hausen B, MD. Schedule an appointment as soon as possible for a visit in 1 week.   Specialty:  General Surgery   Contact information:   [address] [place] [address] [phone]       Follow up with Saguier, Percell Miller, PA-C.   Specialties:  Internal Medicine, Family Medicine   Why:  Call for follow up appointment in about 2 weeks for medical follow up.   Contact information:   [address] [place] [address] [phone]       Signed: Earnstine Regal 07/21/2015, 1:44 PM

## 2015-07-21 NOTE — Consult Note (Signed)
WOC ostomy follow up Stoma type/location: LLQ colostomy Stomal assessment/size: Slightly smaller than 1 and 1/4 inches oval Peristomal assessment: intact Treatment options for stomal/peristomal skin: skin barrier ring Output brown stool Ostomy pouching: 1pc.flat pouch with skin barrier ring  Education provided: Patient's mother observing care today; pouch preparation and application.  Patient is observing today as well and will be a participant in her care as she feels stronger.  She is to begin emptying pouch today. Enrolled patient in [place] Start Discharge program: Yes  WOC wound follow up Wound type:midlione surgical with NPWT (Seen with Will Creig Hines, PA) Measurement:9cm x 5cm x 1.5cm Wound SUO:RVIF, moist Drainage (amount, consistency, odor) scant serous Periwound:intact Dressing procedure/placement/frequency:NPWT removed and wound cleansed with NS. NS wet to dry dressing placed today in anticipation of discharge where NPWT will be resumed, either today or tomorrow.  Mother is comfortable with performing wet to dry dressings.   [facility] nursing team will not follow, but will remain available to this patient, the nursing and medical teams.  Please re-consult if needed. Thanks, Maudie Flakes, MSN, RN, [place], Arther Abbott  Pager# [phone]

## 2015-07-22 ENCOUNTER — Telehealth: Payer: Self-pay

## 2015-07-22 NOTE — Telephone Encounter (Signed)
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   Transition Care Management Follow-up Telephone Call  How have you been since you were released from the hospital?  Pt states she was discharged last night and "so far so good."  No fever, chills, n/v, excessive weakness, shortness of breath, chest pain.  Experiencing some pain, but relieved with pain medication.  Pt tolerating diet without difficulty.   Pt has foley and colostomy.     Do you understand why you were in the hospital? yes   Do you understand the discharge instructions? yes  Items Reviewed:  Medications reviewed: yes  Allergies reviewed: yes  Dietary changes reviewed: yes  Referrals reviewed: yes, pt has appt with Dr. Hassell Done on 08/01/15.  Home health has been in home and has placed wound vac.  Dressing changes on M, W, F.       Functional Questionnaire:   Activities of Daily Living (ADLs):   She states they are independent in the following: ambulation, bathing and hygiene, feeding, continence, grooming, toileting and dressing States they require assistance with the following: mother assist with cooking, cleaning, and child care.     Any transportation issues/concerns?: no   Any patient concerns? no   Confirmed importance and date/time of follow-up visits scheduled: Pt states she will call back to schedule the appt after she meets with her surgeon on 08/01/15.

## 2015-07-23 ENCOUNTER — Telehealth: Payer: Self-pay | Admitting: Oncology

## 2015-07-23 ENCOUNTER — Ambulatory Visit (HOSPITAL_BASED_OUTPATIENT_CLINIC_OR_DEPARTMENT_OTHER): Payer: BLUE CROSS/BLUE SHIELD | Admitting: Oncology

## 2015-07-23 VITALS — BP 119/87 | HR 127 | Temp 97.5°F | Resp 18 | Ht 61.5 in | Wt 122.2 lb

## 2015-07-23 DIAGNOSIS — D509 Iron deficiency anemia, unspecified: Secondary | ICD-10-CM

## 2015-07-23 DIAGNOSIS — D72829 Elevated white blood cell count, unspecified: Secondary | ICD-10-CM | POA: Diagnosis not present

## 2015-07-23 DIAGNOSIS — D473 Essential (hemorrhagic) thrombocythemia: Secondary | ICD-10-CM | POA: Diagnosis not present

## 2015-07-23 DIAGNOSIS — Z85820 Personal history of malignant melanoma of skin: Secondary | ICD-10-CM

## 2015-07-23 NOTE — Progress Notes (Signed)
Hematology and Oncology Follow Up Visit  [name] [id number] [birth year] 54 y.o. 07/23/2015 3:17 PM Saguier, Percell Miller, PA-CSaguier, [name]   Principle Diagnosis: 54 year old woman with a leukocytosis and thrombocytosis in the setting of acute colitis and abscess formation likely reactive in nature. This was discovered in September 2016. She also has a remote history of melanoma.   Prior Therapy: She is status post surgical resection of her melanoma in 2008.  Current therapy: Observation and surveillance.  Interim History: [name] presents today for a follow-up visit. Since the last visit, she was hospitalized on 07/15/2015 for surgery. She underwent a laparoscopic procedure with sigmoid colectomy, colostomy and placement of a wound VAC because of a colonic vaginal fistula. She recovered well at this time and continues to have a wound VAC. Her bowels are moving regularly at this time via colostomy. She has not reported any fevers or chills or recent infections. Her appetite is improving.  She does not report any headaches, blurry vision, syncope or seizures. She is not abort any fevers, chills or sweats. She does not report any chest pain or palpitation. Does not report any orthopnea or leg edema. Does not report any cough or hemoptysis or hematemesis. She does not report any nausea, vomiting she does report abdominal pain but no diarrhea or hematochezia. She does not report any frequency urgency or hesitancy. She does not report any skeletal pain. Remaining review of systems unremarkable  Medications: I have reviewed the patient's current medications.  Current Outpatient Prescriptions  Medication Sig Dispense Refill  . acetaminophen (TYLENOL) 500 MG tablet Take 2 tablets (1,000 mg total) by mouth every 6 (six) hours. 30 tablet 0  . DULoxetine (CYMBALTA) 60 MG capsule Take 60 mg by mouth daily.    . ferrous sulfate 325 (65 FE) MG tablet You can buy this over the counter without a  prescription.  Take one daily with supper after your bowel movements are more regular.  Start around 07/26/15.  3  . oxyCODONE (OXY IR/ROXICODONE) 5 MG immediate release tablet Take 1-3 tablets (5-15 mg total) by mouth every 4 (four) hours as needed for moderate pain. 50 tablet 0  . Prenatal Vit-Fe Fumarate-FA (PRENATAL MULTIVITAMIN) TABS tablet Take 1 tablet by mouth daily at 12 noon.    . saccharomyces boulardii (FLORASTOR) 250 MG capsule Take 1 capsule (250 mg total) by mouth 2 (two) times daily. 60 capsule 0   No current facility-administered medications for this visit.     Allergies:  Allergies  Allergen Reactions  . Ampicillin Nausea And Vomiting    Past Medical History, Surgical history, Social history, and Family History were reviewed and updated.   Physical Exam: Blood pressure 119/87, pulse 127, temperature 97.5 F (36.4 C), temperature source Oral, resp. rate 18, height 5' 1.5" (1.562 m), weight 122 lb 3.2 oz (55.43 kg), SpO2 100 %. ECOG: 1 General appearance: alert and cooperative Head: Normocephalic, without obvious abnormality Neck: no adenopathy Lymph nodes: Cervical, supraclavicular, and axillary nodes normal. Heart:regular rate and rhythm, S1, S2 normal, no murmur, click, rub or gallop Lung:chest clear, no wheezing, rales, normal symmetric air entry Abdomin: soft, non-tender, without masses or organomegaly. Wound VAC in place. Colostomy bag noted. EXT:no erythema, induration, or nodules   Lab Results: Lab Results  Component Value Date   WBC 11.7* 07/21/2015   HGB 8.4* 07/21/2015   HCT 25.8* 07/21/2015   MCV 92.8 07/21/2015   PLT 605* 07/21/2015     Chemistry      Component  Value Date/Time   NA 139 07/21/2015 0003   K 4.0 07/21/2015 0003   CL 103 07/21/2015 0003   CO2 32 07/21/2015 0003   BUN <5* 07/21/2015 0003   CREATININE 0.48 07/21/2015 0003      Component Value Date/Time   CALCIUM 8.7* 07/21/2015 0003   ALKPHOS 66 06/18/2015 1108   AST 16  06/18/2015 1108   ALT 7* 06/18/2015 1108   BILITOT 0.3 06/18/2015 1108       Radiological Studies:  EXAM: CT CHEST WITH CONTRAST  TECHNIQUE: Multidetector CT imaging of the chest was performed during intravenous contrast administration.  CONTRAST: 25mL OMNIPAQUE IOHEXOL 300 MG/ML SOLN  COMPARISON: Abdominal CT 06/17/2015. No previous chest CT.  FINDINGS: Mediastinum/Nodes: There are no enlarged mediastinal, hilar or axillary lymph nodes. The thyroid gland, trachea and esophagus demonstrate no significant findings. The heart size is normal. There is no pericardial effusion. There is moderate atherosclerosis of the aorta, great vessels and coronary arteries.  Lungs/Pleura: There is no pleural effusion. The lungs demonstrate emphysema with small calcified granulomas in both upper lobes (images 25 and 26). In addition, there are tiny noncalcified pulmonary nodules in both upper lobes, seen on images 15 and 20. No highly suspicious nodules demonstrated.  Upper abdomen: The visualized upper abdomen demonstrates no significant findings. There is focal fat adjacent to the falciform ligament which appears unchanged. There is a small hypervascular lesion in the left hepatic lobe on image 49, stable and likely a small hemangioma or other incidental vascular lesion. A left renal parapelvic cyst appears unchanged.  Musculoskeletal/Chest wall: There is no chest wall mass or suspicious osseous finding.  IMPRESSION: 1. No acute chest findings. 2. No findings suspicious for metastatic disease. There are tiny calcified and noncalcified pulmonary nodules bilaterally which are nonspecific although likely postinflammatory. Stability of these nodules could be addressed with follow-up CT in 6-12 months. Fleischner criteria do not apply given the history of melanoma. 3. Moderate atherosclerosis and emphysema noted.   Impression and Plan:  54 year old woman with the  following issues:  1. Leukocytosis and thrombocytosis: This was detected in the setting of acute colitis, abscess formation and possible perforation. Most likely this represented a reactive findings as indicated by a rapid recovery of her white cell count. Her white cell count went from 21,000 with left shift down to normal white cell count with normal differential.  Her platelet count is dropping but continues to be elevated around 605. Possibility of a essential thrombocythemia is a myeloproliferative disorder remains at this time. In all likelihood we are still dealing with a reactive finding but we'll continue to monitor. I plan on repeating a CBC in 3 months.  2. History of melanoma and enlarging sternal mass: CT scan of the chest obtained on 07/08/2015 was reviewed and showed no evidence of metastatic disease. We will continue to follow her clinically.   3. Diverticulitis and abscess formation: She is status post sigmoid colectomy and a colostomy formation. He follows up with surgery regarding this issue.     Zola Button, MD 11/2/20163:17 PM

## 2015-07-23 NOTE — Telephone Encounter (Signed)
Gave and printed appt sched and avs fo rpt for Feb 2017 °

## 2015-07-27 NOTE — Anesthesia Postprocedure Evaluation (Signed)
  Anesthesia Post-op Note  Patient: Claudina Lick  Procedure(s) Performed: Procedure(s) (LRB): LAPAROSCOPIC ASSISTED SIGMOID COLECTOMY WITH COLOSTOMY, HARTMANN PROCEDURE, WITH PLACEMENT OF WOUND VAC (N/A)  Patient Location: PACU  Anesthesia Type: General  Level of Consciousness: awake and alert   Airway and Oxygen Therapy: Patient Spontanous Breathing  Post-op Pain: mild  Post-op Assessment: Post-op Vital signs reviewed, Patient's Cardiovascular Status Stable, Respiratory Function Stable, Patent Airway and No signs of Nausea or vomiting  Last Vitals:  Filed Vitals:   07/21/15 1450  BP: 112/60  Pulse: 86  Temp: 36.8 C  Resp: 18    Post-op Vital Signs: stable   Complications: No apparent anesthesia complications

## 2015-08-04 ENCOUNTER — Telehealth: Payer: Self-pay | Admitting: Internal Medicine

## 2015-08-04 NOTE — Telephone Encounter (Signed)
Cancel colonoscopy (old appointment prior to her surgical issues, saw Cecille Rubin as new pt). No plans for colonoscopy until surgical issues resolved. Often, surgery may request colonoscopy prior to reversal (generally 6 or more months down the road) if she has not had one recently. If it gets to that point, down the road, would need OV prior. Thanks

## 2015-08-04 NOTE — Telephone Encounter (Signed)
Closed in error.

## 2015-08-04 NOTE — Telephone Encounter (Signed)
Pt is scheduled for colon 08/19/15 but still has her colostomy bag in place. Pt wants to know if this should be postponed until it is reversed.  Note from Dr. Alen Blew: [name] presents today for a follow-up visit. Since the last visit, she was hospitalized on 07/15/2015 for surgery. She underwent a laparoscopic procedure with sigmoid colectomy, colostomy and placement of a wound VAC because of a colonic vaginal fistula. She recovered well at this time and continues to have a wound VAC. Her bowels are moving regularly at this time via colostomy. She has not reported any fevers or chills or recent infections. Her appetite is improving.  Dr. Henrene Pastor please advise.

## 2015-08-05 NOTE — Telephone Encounter (Signed)
Pt aware and colon cancelled.

## 2015-08-07 ENCOUNTER — Telehealth: Payer: Self-pay | Admitting: Medical

## 2015-08-07 NOTE — Telephone Encounter (Signed)
[facility] and she stated pt had a foul smell to the urine. Per VO from Dr. Etter Sjogren ok to get a U/A.

## 2015-08-07 NOTE — Telephone Encounter (Signed)
error 

## 2015-08-11 NOTE — Telephone Encounter (Signed)
Results of UA/culture received. Forwarded to Dr. Etter Sjogren. JG//CMA

## 2015-08-12 ENCOUNTER — Other Ambulatory Visit: Payer: Self-pay

## 2015-08-12 ENCOUNTER — Telehealth: Payer: Self-pay | Admitting: Medical

## 2015-08-12 MED ORDER — CIPROFLOXACIN HCL 250 MG PO TABS: 250.0000 mg | ORAL_TABLET | Freq: Two times a day (BID) | ORAL | Status: DC

## 2015-08-12 NOTE — Telephone Encounter (Signed)
No I don't have any results for her.     KP

## 2015-08-12 NOTE — Telephone Encounter (Signed)
Spoke with Janace Hoard RN at [facility] and she voices understanding. Pt will be notified that medication is ready at pharmacy.

## 2015-08-12 NOTE — Telephone Encounter (Signed)
Caller name: Janace Hoard RN from [facility] back number: [phone]  Reason for call:  RN would like a call regarding urine results

## 2015-08-12 NOTE — Telephone Encounter (Signed)
Called [facility] requested they resend results. States they will.

## 2015-08-19 ENCOUNTER — Encounter: Payer: BLUE CROSS/BLUE SHIELD | Admitting: Internal Medicine

## 2015-08-29 ENCOUNTER — Encounter: Payer: Self-pay | Admitting: Medical

## 2015-08-29 ENCOUNTER — Other Ambulatory Visit: Payer: Self-pay

## 2015-08-29 ENCOUNTER — Ambulatory Visit (INDEPENDENT_AMBULATORY_CARE_PROVIDER_SITE_OTHER): Payer: BLUE CROSS/BLUE SHIELD | Admitting: Medical

## 2015-08-29 VITALS — BP 122/83 | HR 94 | Temp 97.7°F | Ht 61.5 in | Wt 119.4 lb

## 2015-08-29 DIAGNOSIS — Z1231 Encounter for screening mammogram for malignant neoplasm of breast: Secondary | ICD-10-CM

## 2015-08-29 DIAGNOSIS — F32A Depression, unspecified: Secondary | ICD-10-CM

## 2015-08-29 DIAGNOSIS — Z23 Encounter for immunization: Secondary | ICD-10-CM

## 2015-08-29 DIAGNOSIS — Z01419 Encounter for gynecological examination (general) (routine) without abnormal findings: Secondary | ICD-10-CM

## 2015-08-29 DIAGNOSIS — F329 Major depressive disorder, single episode, unspecified: Secondary | ICD-10-CM | POA: Diagnosis not present

## 2015-08-29 DIAGNOSIS — Z8719 Personal history of other diseases of the digestive system: Secondary | ICD-10-CM | POA: Diagnosis not present

## 2015-08-29 DIAGNOSIS — D649 Anemia, unspecified: Secondary | ICD-10-CM

## 2015-08-29 LAB — CBC WITH DIFFERENTIAL/PLATELET
BASOS PCT: 0.5 % (ref 0.0–3.0)
Basophils Absolute: 0.1 10*3/uL (ref 0.0–0.1)
EOS ABS: 0.1 10*3/uL (ref 0.0–0.7)
EOS PCT: 0.7 % (ref 0.0–5.0)
HEMATOCRIT: 35.5 % — AB (ref 36.0–46.0)
HEMOGLOBIN: 11.4 g/dL — AB (ref 12.0–15.0)
LYMPHS PCT: 19.1 % (ref 12.0–46.0)
Lymphs Abs: 2.7 10*3/uL (ref 0.7–4.0)
MCHC: 32.1 g/dL (ref 30.0–36.0)
MCV: 92.2 fl (ref 78.0–100.0)
MONO ABS: 0.9 10*3/uL (ref 0.1–1.0)
Monocytes Relative: 6.5 % (ref 3.0–12.0)
Neutro Abs: 10.3 10*3/uL — ABNORMAL HIGH (ref 1.4–7.7)
Neutrophils Relative %: 73.2 % (ref 43.0–77.0)
Platelets: 869 10*3/uL — ABNORMAL HIGH (ref 150.0–400.0)
RBC: 3.85 Mil/uL — AB (ref 3.87–5.11)
RDW: 16.8 % — AB (ref 11.5–15.5)
WBC: 14.1 10*3/uL — AB (ref 4.0–10.5)

## 2015-08-29 LAB — IRON AND TIBC
%SAT: 7 % — AB (ref 11–50)
IRON: 15 ug/dL — AB (ref 45–160)
TIBC: 203 ug/dL — ABNORMAL LOW (ref 250–450)
UIBC: 188 ug/dL (ref 125–400)

## 2015-08-29 LAB — VITAMIN B12: VITAMIN B 12: 528 pg/mL (ref 211–911)

## 2015-08-29 LAB — FERRITIN: FERRITIN: 283.3 ng/mL (ref 10.0–291.0)

## 2015-08-29 MED ORDER — DULOXETINE HCL 60 MG PO CPEP: 60.0000 mg | ORAL_CAPSULE | Freq: Every day | ORAL | Status: DC

## 2015-08-29 NOTE — Patient Instructions (Addendum)
Continue to follow up with surgeon as regularly scheduled. Glad you are much improved.  For anemia will get cbc, iron studies and b12. This will help [place] determine management plan.  Order screeing mammogram.(if you don't get call on scheduling within 2 wks notify us) Refer to gyn for eval.  tdap today.  Follow up in 3 weeks or as needed\  For depression refill of your cymbalta

## 2015-08-29 NOTE — Progress Notes (Signed)
Pre visit review using our clinic review tool, if applicable. No additional management support is needed unless otherwise documented below in the visit note. 

## 2015-08-29 NOTE — Addendum Note (Signed)
Addended by: Anabel Halon on: 08/29/2015 02:42 PM   Modules accepted: Orders

## 2015-08-29 NOTE — Telephone Encounter (Signed)
Pt declined flu vaccine, hep c studies, today. Will you abstract that.

## 2015-08-29 NOTE — Progress Notes (Signed)
Subjective:    Patient ID: [name], female    DOB: 1961/06/13, 54 y.o.   MRN: [id number]  HPI   Pt in for follow up. Pt has history of diverticulitis and ruptures in 4 areas and absesses. She had  Fistulas. She had portion of colon resection and now colostomy. Pt is going to see surgeon next month.  Pt had some uti symptoms with fistula but no current signs or symptoms. She got better with antibiotics.  Pt had hysterectomy in 1989. Pt does have her ovaries. Pt wants referral for gynecologist.  Pt is anemic. She takes iron one time a day. Pt blood work showed hb 8.5 and check hct 26.5. I don't see any iron levels in computer. Pt has been on iron for a month.   Will check b12 level as well.  Pt has not had any preventative measures in a while due to taking care of her husband and her recent illness.   Put in order screening mammogram.  Will get tdap today. Pt declined hep c testing.  Will refer to gyn for pelvic exam  Pt states she needs cymbalta. She has history of depression. She states it helped a lot in past. She used it after her husband passed. Pt ran out of med 2 wks ago.    Review of Systems  Constitutional: Negative for fever, chills, diaphoresis, activity change and fatigue.  Respiratory: Negative for cough, chest tightness and shortness of breath.   Cardiovascular: Negative for chest pain, palpitations and leg swelling.  Gastrointestinal: Negative for nausea, vomiting, abdominal pain, diarrhea, constipation and blood in stool.  Musculoskeletal: Negative for neck pain and neck stiffness.  Neurological: Negative for dizziness, speech difficulty and weakness.  Psychiatric/Behavioral: Positive for dysphoric mood. Negative for behavioral problems, confusion and agitation. The patient is not nervous/anxious.    Past Medical History  Diagnosis Date  . Arthritis   . Depression     pt states when her husband passed away. now feels fine.  . Melanoma ([place])     Chest       Social History   Social History  . Marital Status: Widowed    Spouse Name: N/A  . Number of Children: N/A  . Years of Education: N/A   Occupational History  . Not on file.   Social History Main Topics  . Smoking status: Current Every Day Smoker -- 0.50 packs/day for 30 years    Types: Cigarettes  . Smokeless tobacco: Never Used     Comment: Pt info given 05-30-2015  . Alcohol Use: 0.0 oz/week    0 Standard drinks or equivalent per week     Comment: 2 glasses of wine a week.  . Drug Use: No  . Sexual Activity: No   Other Topics Concern  . Not on file   Social History Narrative    Past Surgical History  Procedure Laterality Date  . Abdominal hysterectomy    . Tonsillectomy    . Back surgery    . Melanoma excision    . Flexible sigmoidoscopy N/A 06/05/2015    Procedure: FLEXIBLE SIGMOIDOSCOPY;  Surgeon: Manus Gunning, MD;  Location: [facility];  Service: Gastroenterology;  Laterality: N/A;  . Partial colectomy N/A 07/15/2015    Procedure: LAPAROSCOPIC ASSISTED SIGMOID COLECTOMY WITH COLOSTOMY, HARTMANN PROCEDURE, WITH PLACEMENT OF WOUND VAC;  Surgeon: Johnathan Hausen, MD;  Location: [facility];  Service: General;  Laterality: N/A;    Family History  Problem Relation Age of Onset  .  Hypertension Father     Allergies  Allergen Reactions  . Ampicillin Nausea And Vomiting    Current Outpatient Prescriptions on File Prior to Visit  Medication Sig Dispense Refill  . acetaminophen (TYLENOL) 500 MG tablet Take 2 tablets (1,000 mg total) by mouth every 6 (six) hours. 30 tablet 0  . DULoxetine (CYMBALTA) 60 MG capsule Take 60 mg by mouth daily.    . ferrous sulfate 325 (65 FE) MG tablet You can buy this over the counter without a prescription.  Take one daily with supper after your bowel movements are more regular.  Start around 07/26/15.  3  . Prenatal Vit-Fe Fumarate-FA (PRENATAL MULTIVITAMIN) TABS tablet Take 1 tablet by mouth daily at 12 noon.    . saccharomyces  boulardii (FLORASTOR) 250 MG capsule Take 1 capsule (250 mg total) by mouth 2 (two) times daily. 60 capsule 0   No current facility-administered medications on file prior to visit.    BP 122/83 mmHg  Pulse 111  Temp(Src) 97.7 F (36.5 C) (Oral)  Ht 5' 1.5" (1.562 m)  Wt 119 lb 6.4 oz (54.159 kg)  BMI 22.20 kg/m2  SpO2 98%       Objective:   Physical Exam  General Appearance- Not in acute distress.  HEENT Eyes- Scleraeral/Conjuntiva-bilat- Not Yellow. Mouth & Throat- Normal.  Chest and Lung Exam Auscultation: Breath sounds:-Normal. Adventitious sounds:- No Adventitious sounds.  Cardiovascular Auscultation:Rythm - Regular. Heart Sounds -Normal heart sounds.  Abdomen Inspection:-Inspection Normal.  Palpation/Perucssion: Palpation and Percussion of the abdomen reveal- Non Tender, No Rebound tenderness, No rigidity(Guarding) and No Palpable abdominal masses.  Liver:-Normal.  Spleen:- Normal.  Colostomy site looks good. No redness. No tenderness.      Assessment & Plan:  Continue to follow up with surgeon as regularly scheduled. Glad you are much improved.  For anemia will get cbc, iron studies and b12. This will help [place] determine management plan.  Order screeing mammogram.(if you don't get call on scheduling within 2 wks notify us) Refer to gyn for eval.  tdap today.  Follow up in 3 weeks or as needed

## 2015-09-01 NOTE — Addendum Note (Signed)
Addended by: Tasia Catchings on: 09/01/2015 09:59 AM   Modules accepted: Orders

## 2015-09-08 ENCOUNTER — Encounter: Payer: BLUE CROSS/BLUE SHIELD | Admitting: Obstetrics & Gynecology

## 2015-09-08 ENCOUNTER — Ambulatory Visit (INDEPENDENT_AMBULATORY_CARE_PROVIDER_SITE_OTHER): Payer: BLUE CROSS/BLUE SHIELD | Admitting: Obstetrics & Gynecology

## 2015-09-08 ENCOUNTER — Encounter: Payer: Self-pay | Admitting: Obstetrics & Gynecology

## 2015-09-08 ENCOUNTER — Inpatient Hospital Stay (HOSPITAL_BASED_OUTPATIENT_CLINIC_OR_DEPARTMENT_OTHER)
Admission: RE | Admit: 2015-09-08 | Discharge: 2015-09-08 | Disposition: A | Discharge status: Home / Self Care | Payer: BLUE CROSS/BLUE SHIELD | Source: Ambulatory Visit | Attending: Medical | Admitting: Medical

## 2015-09-08 ENCOUNTER — Ambulatory Visit (HOSPITAL_BASED_OUTPATIENT_CLINIC_OR_DEPARTMENT_OTHER)
Admission: RE | Admit: 2015-09-08 | Discharge: 2015-09-08 | Disposition: A | Discharge status: Home / Self Care | Payer: BLUE CROSS/BLUE SHIELD | Source: Ambulatory Visit | Attending: Medical | Admitting: Medical

## 2015-09-08 VITALS — BP 122/67 | HR 98 | Ht 62.0 in | Wt 117.0 lb

## 2015-09-08 DIAGNOSIS — Z01419 Encounter for gynecological examination (general) (routine) without abnormal findings: Secondary | ICD-10-CM

## 2015-09-08 DIAGNOSIS — Z1231 Encounter for screening mammogram for malignant neoplasm of breast: Secondary | ICD-10-CM | POA: Diagnosis present

## 2015-09-08 DIAGNOSIS — Z90710 Acquired absence of both cervix and uterus: Secondary | ICD-10-CM | POA: Diagnosis not present

## 2015-09-08 NOTE — Progress Notes (Signed)
GYNECOLOGY CLINIC ANNUAL PREVENTATIVE CARE ENCOUNTER NOTE  Subjective:   [name] is a 54 y.o. female here for a routine annual gynecologic exam.  She underwent TAH in 1989 for bleeding, had benign pathology.  Ovaries were not removed. 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 Patient reports the colostomy is temporary, followed by General Surgery.    Current GYN complaints: urinary incontinence for which she wears a pad daily. Has seen Urology in past, was given medications which did not help.  Surgery was recommended, she declined this option and still does not want any further intervention.  Denies abnormal vaginal bleeding, discharge, pelvic pain, or other gynecologic concerns.    Gynecologic History No LMP recorded. Patient has had a hysterectomy. Contraception: status post hysterectomy Last mammogram: 05/2014. Results were: normal. Scheduled for mammogram today.   Past Medical History  Diagnosis Date  . Arthritis   . Depression     pt states when her husband passed away. now feels fine.  . Melanoma [facility])     Chest     Past Surgical History  Procedure Laterality Date  . Abdominal hysterectomy  1989    Ovaries in place  . Tonsillectomy    . Back surgery    . Melanoma excision    . Flexible sigmoidoscopy N/A 06/05/2015    Procedure: FLEXIBLE SIGMOIDOSCOPY;  Surgeon: Manus Gunning, MD;  Location: [facility];  Service: Gastroenterology;  Laterality: N/A;  . Partial colectomy N/A 07/15/2015    Procedure: LAPAROSCOPIC ASSISTED SIGMOID COLECTOMY WITH COLOSTOMY, HARTMANN PROCEDURE, WITH PLACEMENT OF WOUND VAC;  Surgeon: Johnathan Hausen, MD;  Location: [facility];  Service: General;  Laterality: N/A;    Current Outpatient Prescriptions on  File Prior to Visit  Medication Sig Dispense Refill  . acetaminophen (TYLENOL) 500 MG tablet Take 2 tablets (1,000 mg total) by mouth every 6 (six) hours. 30 tablet 0  . Cyanocobalamin (VITAMIN B 12 PO) Take by mouth.    . DULoxetine (CYMBALTA) 60 MG capsule Take 1 capsule (60 mg total) by mouth daily. 30 capsule 1  . ferrous sulfate 325 (65 FE) MG tablet You can buy this over the counter without a prescription.  Take one daily with supper after your bowel movements are more regular.  Start around 07/26/15.  3  . Prenatal Vit-Fe Fumarate-FA (PRENATAL MULTIVITAMIN) TABS tablet Take 1 tablet by mouth daily at 12 noon.    . saccharomyces boulardii (FLORASTOR) 250 MG capsule Take 1 capsule (250 mg total) by mouth 2 (two) times daily. 60 capsule 0   No current facility-administered medications on file prior to visit.    Allergies  Allergen Reactions  . Ampicillin Nausea And Vomiting    Social History   Social History  . Marital Status: Widowed    Spouse Name: N/A  . Number of Children: N/A  . Years of Education: N/A   Occupational History  . Not on file.   Social History Main Topics  . Smoking status: Current Every Day Smoker -- 0.50 packs/day for 30 years    Types: Cigarettes  . Smokeless tobacco: Never Used     Comment: Pt info given 05-30-2015  . Alcohol Use: 0.0 oz/week    0 Standard drinks or equivalent per week     Comment: 2 glasses of wine a week.  Marland Kitchen  Drug Use: No  . Sexual Activity: No   Other Topics Concern  . Not on file   Social History Narrative    Family History  Problem Relation Age of Onset  . Hypertension Father     The following portions of the patient's history were reviewed and updated as appropriate: allergies, current medications, past family history, past medical history, past social history, past surgical history and problem list.  Review of Systems Pertinent items noted in HPI and remainder of comprehensive ROS otherwise negative.   Objective:    BP 122/67 mmHg  Pulse 98  Ht 5\' 2"  (1.575 m)  Wt 117 lb (53.071 kg)  BMI 21.39 kg/m2 CONSTITUTIONAL: Well-developed, well-nourished female in no acute distress.  HENT:  Normocephalic, atraumatic, External right and left ear normal. Oropharynx is clear and moist EYES: Conjunctivae and EOM are normal. Pupils are equal, round, and reactive to light. No scleral icterus.  NECK: Normal range of motion, supple, no masses.  Normal thyroid.  SKIN: Skin is warm and dry. No rash noted. Not diaphoretic. No erythema. No pallor. [place]: Alert and oriented to person, place, and time. Normal reflexes, muscle tone coordination. No cranial nerve deficit noted. PSYCHIATRIC: Normal mood and affect. Normal behavior. Normal judgment and thought content. CARDIOVASCULAR: Normal heart rate noted, regular rhythm RESPIRATORY: Clear to auscultation bilaterally. Effort and breath sounds normal, no problems with respiration noted. BREASTS: Symmetric in size. No masses, skin changes, nipple drainage, or lymphadenopathy. ABDOMEN: Soft, normal bowel sounds, no distention noted.  No tenderness, rebound or guarding.  PELVIC: Normal appearing external genitalia with mild atrophy; normal appearing vaginal mucosa with mild atrophy and well-healed vaginal cuff.  No abnormal discharge noted.  Unable to notice any previous fistula site, likely healed since surgery. Unable to palpate adnexa, no tenderness on exam. MUSCULOSKELETAL: Normal range of motion. No tenderness.  No cyanosis, clubbing, or edema.  2+ distal pulses.   Assessment:  Annual gynecologic examination    Plan:  Normal pelvic exam, no need for pap smear of vaginal cuff as hysterectomy was done for benign indications and had benign pathology Mammogram scheduled today, will follow up results and manage accordingly. Routine preventative health maintenance measures emphasized. Please refer to After Visit Summary for other counseling recommendations.    Verita Schneiders, MD, [place] Attending Obstetrician & Gynecologist, [place] [facility]

## 2015-09-18 ENCOUNTER — Telehealth: Payer: Self-pay | Admitting: Medical

## 2015-09-18 NOTE — Telephone Encounter (Signed)
error:[id number] ° °

## 2015-09-19 ENCOUNTER — Encounter: Payer: Self-pay | Admitting: Medical

## 2015-09-19 ENCOUNTER — Ambulatory Visit (INDEPENDENT_AMBULATORY_CARE_PROVIDER_SITE_OTHER): Payer: BLUE CROSS/BLUE SHIELD | Admitting: Medical

## 2015-09-19 VITALS — BP 116/70 | HR 81 | Temp 97.4°F | Ht 62.0 in | Wt 117.6 lb

## 2015-09-19 DIAGNOSIS — J069 Acute upper respiratory infection, unspecified: Secondary | ICD-10-CM | POA: Diagnosis not present

## 2015-09-19 DIAGNOSIS — D649 Anemia, unspecified: Secondary | ICD-10-CM

## 2015-09-19 DIAGNOSIS — D473 Essential (hemorrhagic) thrombocythemia: Secondary | ICD-10-CM

## 2015-09-19 DIAGNOSIS — D75839 Thrombocytosis, unspecified: Secondary | ICD-10-CM

## 2015-09-19 LAB — CBC WITH DIFFERENTIAL/PLATELET
BASOS PCT: 0.3 % (ref 0.0–3.0)
Basophils Absolute: 0 10*3/uL (ref 0.0–0.1)
EOS ABS: 0.1 10*3/uL (ref 0.0–0.7)
Eosinophils Relative: 0.7 % (ref 0.0–5.0)
HCT: 38.7 % (ref 36.0–46.0)
HEMOGLOBIN: 12.3 g/dL (ref 12.0–15.0)
LYMPHS PCT: 17.8 % (ref 12.0–46.0)
Lymphs Abs: 2.1 10*3/uL (ref 0.7–4.0)
MCHC: 31.9 g/dL (ref 30.0–36.0)
MCV: 90.5 fl (ref 78.0–100.0)
MONO ABS: 0.9 10*3/uL (ref 0.1–1.0)
Monocytes Relative: 7.5 % (ref 3.0–12.0)
NEUTROS PCT: 73.7 % (ref 43.0–77.0)
Neutro Abs: 8.6 10*3/uL — ABNORMAL HIGH (ref 1.4–7.7)
Platelets: 446 10*3/uL — ABNORMAL HIGH (ref 150.0–400.0)
RBC: 4.27 Mil/uL (ref 3.87–5.11)
RDW: 16.7 % — AB (ref 11.5–15.5)
WBC: 11.7 10*3/uL — ABNORMAL HIGH (ref 4.0–10.5)

## 2015-09-19 LAB — FERRITIN: FERRITIN: 148.2 ng/mL (ref 10.0–291.0)

## 2015-09-19 LAB — IRON AND TIBC
%SAT: 9 % — AB (ref 11–50)
IRON: 24 ug/dL — AB (ref 45–160)
TIBC: 273 ug/dL (ref 250–450)
UIBC: 249 ug/dL (ref 125–400)

## 2015-09-19 NOTE — Progress Notes (Signed)
Subjective:    Patient ID: [name], female    DOB: [birth year], 54 y.o.   MRN: [id number]  HPI   Pt in for follow up. Pt feeling well.  Pt is going to see surgeon in next couple of weeks. Pt has history of colectomy and colostomy. She will see him for follow up.  Pt has mild runny nose x 2 days(already getting better). No sinus pressure and no productive cough. No fever,no chills or sweats.  Pt has seen Dr. Alen Blew for likely elevated platlets. Pt mentioned that after her hospitalization she was advised to eventually follow up.  Pt states her energy level is good. She states not fatigued. She has had anemia and gradually improving.   Review of Systems  Constitutional: Negative for fever, chills, diaphoresis, activity change and fatigue.  Respiratory: Negative for cough, chest tightness and shortness of breath.   Cardiovascular: Negative for chest pain, palpitations and leg swelling.  Gastrointestinal: Negative for nausea, vomiting and abdominal pain.  Musculoskeletal: Negative for neck pain and neck stiffness.  Neurological: Negative for dizziness, weakness and headaches.  Psychiatric/Behavioral: Negative for behavioral problems, confusion and agitation. The patient is not nervous/anxious.    Past Medical History  Diagnosis Date  . Arthritis   . Depression     pt states when her husband passed away. now feels fine.  . Melanoma ([place])     Chest     Social History   Social History  . Marital Status: Widowed    Spouse Name: N/A  . Number of Children: N/A  . Years of Education: N/A   Occupational History  . Not on file.   Social History Main Topics  . Smoking status: Current Every Day Smoker -- 0.50 packs/day for 30 years    Types: Cigarettes  . Smokeless tobacco: Never Used     Comment: Pt info given 05-30-2015  . Alcohol Use: 0.0 oz/week    0 Standard drinks or equivalent per week     Comment: 2 glasses of wine a week.  . Drug Use: No  . Sexual Activity: No    Other Topics Concern  . Not on file   Social History Narrative    Past Surgical History  Procedure Laterality Date  . Abdominal hysterectomy  1989    Ovaries in place  . Tonsillectomy    . Back surgery    . Melanoma excision    . Flexible sigmoidoscopy N/A 06/05/2015    Procedure: FLEXIBLE SIGMOIDOSCOPY;  Surgeon: Manus Gunning, MD;  Location: [facility];  Service: Gastroenterology;  Laterality: N/A;  . Partial colectomy N/A 07/15/2015    Procedure: LAPAROSCOPIC ASSISTED SIGMOID COLECTOMY WITH COLOSTOMY, HARTMANN PROCEDURE, WITH PLACEMENT OF WOUND VAC;  Surgeon: Johnathan Hausen, MD;  Location[facility];  Service: General;  Laterality: N/A;    Family History  Problem Relation Age of Onset  . Hypertension Father   . Cancer Maternal Aunt     breast  . Cancer Maternal Uncle     leukemia  . Cancer Maternal Grandmother 40    breast  . Heart disease Neg Hx   . Stroke Neg Hx   . Cancer Maternal Uncle     colon    Allergies  Allergen Reactions  . Ampicillin Nausea And Vomiting    Current Outpatient Prescriptions on File Prior to Visit  Medication Sig Dispense Refill  . acetaminophen (TYLENOL) 500 MG tablet Take 2 tablets (1,000 mg total) by mouth every 6 (six) hours. 30 tablet  0  . Cyanocobalamin (VITAMIN B 12 PO) Take by mouth.    . DULoxetine (CYMBALTA) 60 MG capsule Take 1 capsule (60 mg total) by mouth daily. 30 capsule 1  . ferrous sulfate 325 (65 FE) MG tablet You can buy this over the counter without a prescription.  Take one daily with supper after your bowel movements are more regular.  Start around 07/26/15.  3  . Prenatal Vit-Fe Fumarate-FA (PRENATAL MULTIVITAMIN) TABS tablet Take 1 tablet by mouth daily at 12 noon.    . saccharomyces boulardii (FLORASTOR) 250 MG capsule Take 1 capsule (250 mg total) by mouth 2 (two) times daily. 60 capsule 0   No current facility-administered medications on file prior to visit.    BP 116/70 mmHg  Pulse 81  Temp(Src)  97.4 F (36.3 C) (Oral)  Ht 5\' 2"  (1.575 m)  Wt 117 lb 9.6 oz (53.343 kg)  BMI 21.50 kg/m2  SpO2 98%      Objective:   Physical Exam  General Appearance- Not in acute distress.  HEENT Head- Normal. Ear Auditory Canal - Left- Normal. Right - Normal.Tympanic Membrane- Left- Normal. Right- Normal. Eye Sclera/Conjunctiva- Left- Normal. Right- Normal. Nose & Sinuses Nasal Mucosa- Left-  Boggy and Congested. Right-  Boggy and  Congested.Bilateral no maxillary and no frontal sinus pressure. Mouth & Throat Lips: Upper Lip- Normal: no dryness, cracking, pallor, cyanosis, or vesicular eruption. Lower Lip-Normal: no dryness, cracking, pallor, cyanosis or vesicular eruption. Buccal Mucosa- Bilateral- No Aphthous ulcers. Oropharynx- No Discharge or Erythema. Tonsils: Characteristics- Bilateral- No Erythema or Congestion. Size/Enlargement- Bilateral- No enlargement. Discharge- bilateral-None.  Neck Neck- Supple. No Masses.   Chest and Lung Exam Auscultation: Breath sounds:-Normal. Adventitious sounds:- No Adventitious sounds.  Cardiovascular Auscultation:Rythm - Regular. Heart Sounds -Normal heart sounds.  Abdomen Inspection:-Inspection Normal.  Palpation/Perucssion: Palpation and Percussion of the abdomen reveal- Non Tender, No Rebound tenderness, No rigidity(Guarding) and No Palpable abdominal masses.  Liver:-Normal.  Spleen:- Normal.         Assessment & Plan:  I will get cbc and iron studies today. Will likely be referring you to hematologist.   For uri I think you are getting better. If nasal congestion persists could try flonase otc. No antibiotics needed presently  Follow up date to be determined after labs reviewed.

## 2015-09-19 NOTE — Progress Notes (Signed)
Pre visit review using our clinic review tool, if applicable. No additional management support is needed unless otherwise documented below in the visit note. 

## 2015-09-19 NOTE — Patient Instructions (Addendum)
I will get cbc and iron studies today. Will likely be referring you to hematologist.   For uri I think you are getting better. If nasal congestion persists could try flonase otc. No antibiotics needed presently.  Follow up date to be determined after labs reviewed.

## 2015-10-14 NOTE — Patient Instructions (Signed)
[name]  10/14/2015   Your procedure is scheduled on: 10-22-15  Report to [facility] Main  Entrance take [facility]  elevators to 3rd floor to  [place] at 630  AM.  Call this number if you have problems the morning of surgery [phone]   Remember: ONLY 1 PERSON MAY GO WITH YOU TO SHORT STAY TO GET  READY MORNING OF [name].  Do not eat food or drink liquids :After Midnight.     Take these medicines the morning of surgery with A SIP OF WATER:DULOXETINE (CYMBALTA)              You may not have any metal on your body including hair pins and              piercings  Do not wear jewelry, make-up, lotions, powders or perfumes, deodorant             Do not wear nail polish.  Do not shave  48 hours prior to surgery.              Men may shave face and neck.   Do not bring valuables to the hospital. [name].  Contacts, dentures or bridgework may not be worn into surgery.  Leave suitcase in the car. After surgery it may be brought to your room.     Patients discharged the day of surgery will not be allowed to drive home.  Name and phone number of your driver:  Special Instructions: N/A              Please read over the following fact sheets you were given: _____________________________________________________________________             [name] - Preparing for Surgery Before surgery, you can play an important role.  Because skin is not sterile, your skin needs to be as free of germs as possible.  You can reduce the number of germs on your skin by washing with CHG (chlorahexidine gluconate) soap before surgery.  CHG is an antiseptic cleaner which kills germs and bonds with the skin to continue killing germs even after washing. Please DO NOT use if you have an allergy to CHG or antibacterial soaps.  If your skin becomes reddened/irritated stop using the CHG and inform your nurse when you arrive at  Short Stay. Do not shave (including legs and underarms) for at least 48 hours prior to the first CHG shower.  You may shave your face/neck. Please follow these instructions carefully:  1.  Shower with CHG Soap the night before surgery and the  morning of Surgery.  2.  If you choose to wash your hair, wash your hair first as usual with your  normal  shampoo.  3.  After you shampoo, rinse your hair and body thoroughly to remove the  shampoo.                           4.  Use CHG as you would any other liquid soap.  You can apply chg directly  to the skin and wash                       Gently with a scrungie or clean washcloth.  5.  Apply the CHG Soap to your body ONLY FROM THE NECK DOWN.   Do not use on face/ open                           Wound or open sores. Avoid contact with eyes, ears mouth and genitals (private parts).                       Wash face,  Genitals (private parts) with your normal soap.             6.  Wash thoroughly, paying special attention to the area where your surgery  will be performed.  7.  Thoroughly rinse your body with warm water from the neck down.  8.  DO NOT shower/wash with your normal soap after using and rinsing off  the CHG Soap.                9.  Pat yourself dry with a clean towel.            10.  Wear clean pajamas.            11.  Place clean sheets on your bed the night of your first shower and do not  sleep with pets. Day of Surgery : Do not apply any lotions/deodorants the morning of surgery.  Please wear clean clothes to the hospital/surgery center.  FAILURE TO FOLLOW THESE INSTRUCTIONS MAY RESULT IN THE CANCELLATION OF YOUR SURGERY PATIENT SIGNATURE_________________________________  NURSE SIGNATURE__________________________________  ________________________________________________________________________

## 2015-10-14 NOTE — Progress Notes (Signed)
CHEST CT 07-08-15 EPIC

## 2015-10-16 ENCOUNTER — Ambulatory Visit: Payer: Self-pay | Admitting: Surgery

## 2015-10-17 ENCOUNTER — Encounter (HOSPITAL_COMMUNITY)
Admission: RE | Admit: 2015-10-17 | Discharge: 2015-10-17 | Disposition: A | Discharge status: Home / Self Care | Payer: BLUE CROSS/BLUE SHIELD | Source: Ambulatory Visit | Attending: Surgery | Admitting: Surgery

## 2015-10-17 ENCOUNTER — Encounter (HOSPITAL_COMMUNITY): Payer: Self-pay

## 2015-10-17 DIAGNOSIS — Z01812 Encounter for preprocedural laboratory examination: Secondary | ICD-10-CM | POA: Insufficient documentation

## 2015-10-17 HISTORY — DX: Diverticulitis of intestine, part unspecified, without perforation or abscess without bleeding: K57.92

## 2015-10-17 HISTORY — DX: Other chronic pain: G89.29

## 2015-10-17 HISTORY — DX: Dorsalgia, unspecified: M54.9

## 2015-10-17 LAB — CBC
HEMATOCRIT: 41.9 % (ref 36.0–46.0)
HEMOGLOBIN: 13.4 g/dL (ref 12.0–15.0)
MCH: 29.5 pg (ref 26.0–34.0)
MCHC: 32 g/dL (ref 30.0–36.0)
MCV: 92.1 fL (ref 78.0–100.0)
Platelets: 334 10*3/uL (ref 150–400)
RBC: 4.55 MIL/uL (ref 3.87–5.11)
RDW: 16.3 % — ABNORMAL HIGH (ref 11.5–15.5)
WBC: 7.6 10*3/uL (ref 4.0–10.5)

## 2015-10-18 LAB — HEMOGLOBIN A1C
Hgb A1c MFr Bld: 5.5 % (ref 4.8–5.6)
Mean Plasma Glucose: 111 mg/dL

## 2015-10-22 ENCOUNTER — Inpatient Hospital Stay (HOSPITAL_COMMUNITY)
Admission: RE | Admit: 2015-10-22 | Discharge: 2015-10-28 | DRG: 331 | Disposition: A | Discharge status: Home Health | Payer: BLUE CROSS/BLUE SHIELD | Source: Ambulatory Visit | Attending: Surgery | Admitting: Surgery

## 2015-10-22 ENCOUNTER — Encounter (HOSPITAL_COMMUNITY)
Admission: RE | Disposition: A | Discharge status: Home Health | Payer: Self-pay | Source: Ambulatory Visit | Attending: Surgery

## 2015-10-22 ENCOUNTER — Encounter (HOSPITAL_COMMUNITY): Payer: Self-pay | Admitting: Certified Registered Nurse Anesthetist

## 2015-10-22 ENCOUNTER — Other Ambulatory Visit: Payer: Self-pay | Admitting: Surgery

## 2015-10-22 ENCOUNTER — Inpatient Hospital Stay (HOSPITAL_COMMUNITY): Payer: BLUE CROSS/BLUE SHIELD | Admitting: Certified Registered Nurse Anesthetist

## 2015-10-22 ENCOUNTER — Ambulatory Visit: Payer: BLUE CROSS/BLUE SHIELD | Admitting: Oncology

## 2015-10-22 ENCOUNTER — Other Ambulatory Visit: Payer: BLUE CROSS/BLUE SHIELD

## 2015-10-22 DIAGNOSIS — Z85820 Personal history of malignant melanoma of skin: Secondary | ICD-10-CM | POA: Diagnosis not present

## 2015-10-22 DIAGNOSIS — Z433 Encounter for attention to colostomy: Secondary | ICD-10-CM | POA: Diagnosis present

## 2015-10-22 DIAGNOSIS — N83202 Unspecified ovarian cyst, left side: Secondary | ICD-10-CM | POA: Diagnosis present

## 2015-10-22 DIAGNOSIS — M1612 Unilateral primary osteoarthritis, left hip: Secondary | ICD-10-CM | POA: Diagnosis present

## 2015-10-22 DIAGNOSIS — G8929 Other chronic pain: Secondary | ICD-10-CM | POA: Diagnosis present

## 2015-10-22 DIAGNOSIS — F17210 Nicotine dependence, cigarettes, uncomplicated: Secondary | ICD-10-CM | POA: Diagnosis present

## 2015-10-22 DIAGNOSIS — M549 Dorsalgia, unspecified: Secondary | ICD-10-CM | POA: Diagnosis present

## 2015-10-22 DIAGNOSIS — Z806 Family history of leukemia: Secondary | ICD-10-CM

## 2015-10-22 DIAGNOSIS — F329 Major depressive disorder, single episode, unspecified: Secondary | ICD-10-CM | POA: Diagnosis present

## 2015-10-22 DIAGNOSIS — Z8249 Family history of ischemic heart disease and other diseases of the circulatory system: Secondary | ICD-10-CM | POA: Diagnosis not present

## 2015-10-22 DIAGNOSIS — K660 Peritoneal adhesions (postprocedural) (postinfection): Secondary | ICD-10-CM | POA: Diagnosis present

## 2015-10-22 DIAGNOSIS — Z5331 Laparoscopic surgical procedure converted to open procedure: Secondary | ICD-10-CM

## 2015-10-22 DIAGNOSIS — Z803 Family history of malignant neoplasm of breast: Secondary | ICD-10-CM

## 2015-10-22 DIAGNOSIS — D649 Anemia, unspecified: Secondary | ICD-10-CM | POA: Diagnosis present

## 2015-10-22 DIAGNOSIS — Z66 Do not resuscitate: Secondary | ICD-10-CM | POA: Diagnosis present

## 2015-10-22 DIAGNOSIS — Z01812 Encounter for preprocedural laboratory examination: Secondary | ICD-10-CM | POA: Diagnosis not present

## 2015-10-22 DIAGNOSIS — Z9049 Acquired absence of other specified parts of digestive tract: Secondary | ICD-10-CM

## 2015-10-22 HISTORY — PX: COLOSTOMY TAKEDOWN: SHX5258

## 2015-10-22 LAB — CREATININE, SERUM
Creatinine, Ser: 0.61 mg/dL (ref 0.44–1.00)
GFR calc Af Amer: >60 mL/min (ref >60)

## 2015-10-22 LAB — CBC
HEMATOCRIT: 35.5 % — AB (ref 36.0–46.0)
Hemoglobin: 11.6 g/dL — ABNORMAL LOW (ref 12.0–15.0)
MCH: 29.8 pg (ref 26.0–34.0)
MCHC: 32.7 g/dL (ref 30.0–36.0)
MCV: 91.3 fL (ref 78.0–100.0)
Platelets: 324 10*3/uL (ref 150–400)
RBC: 3.89 MIL/uL (ref 3.87–5.11)
RDW: 16.4 % — AB (ref 11.5–15.5)
WBC: 16.5 10*3/uL — AB (ref 4.0–10.5)

## 2015-10-22 SURGERY — CLOSURE, COLOSTOMY, LAPAROSCOPIC
Anesthesia: General | Site: Abdomen

## 2015-10-22 MED ORDER — MIDAZOLAM HCL 5 MG/5ML IJ SOLN
INTRAMUSCULAR | Status: DC | PRN
  Administered 2015-10-22: 2 mg via INTRAVENOUS

## 2015-10-22 MED ORDER — SODIUM CHLORIDE 0.9 % IJ SOLN
INTRAMUSCULAR | Status: AC
  Filled 2015-10-22: qty 10

## 2015-10-22 MED ORDER — PROPOFOL 10 MG/ML IV BOLUS
INTRAVENOUS | Status: AC
  Filled 2015-10-22: qty 20

## 2015-10-22 MED ORDER — SUGAMMADEX SODIUM 200 MG/2ML IV SOLN
INTRAVENOUS | Status: AC
Start: 2015-10-22 — End: 2015-10-22
  Filled 2015-10-22: qty 2

## 2015-10-22 MED ORDER — ONDANSETRON HCL 4 MG/2ML IJ SOLN: 4.0000 mg | Freq: Once | INTRAMUSCULAR | Status: DC | PRN

## 2015-10-22 MED ORDER — HEPARIN SODIUM (PORCINE) 5000 UNIT/ML IJ SOLN
5000.0000 [IU] | Freq: Once | INTRAMUSCULAR | Status: AC
  Administered 2015-10-22: 5000 [IU] via SUBCUTANEOUS
  Filled 2015-10-22: qty 1

## 2015-10-22 MED ORDER — ALVIMOPAN 12 MG PO CAPS
12.0000 mg | ORAL_CAPSULE | Freq: Two times a day (BID) | ORAL | Status: DC
  Administered 2015-10-23 – 2015-10-28 (×11): 12 mg via ORAL
  Filled 2015-10-22 (×12): qty 1

## 2015-10-22 MED ORDER — LABETALOL HCL 5 MG/ML IV SOLN
INTRAVENOUS | Status: AC
  Filled 2015-10-22: qty 4

## 2015-10-22 MED ORDER — MORPHINE SULFATE (PF) 2 MG/ML IV SOLN
1.0000 mg | INTRAVENOUS | Status: DC | PRN
  Administered 2015-10-22 – 2015-10-23 (×14): 1 mg via INTRAVENOUS
  Filled 2015-10-22 (×14): qty 1

## 2015-10-22 MED ORDER — ROCURONIUM BROMIDE 100 MG/10ML IV SOLN
INTRAVENOUS | Status: DC | PRN
  Administered 2015-10-22 (×6): 10 mg via INTRAVENOUS
  Administered 2015-10-22: 40 mg via INTRAVENOUS

## 2015-10-22 MED ORDER — EPHEDRINE SULFATE 50 MG/ML IJ SOLN
INTRAMUSCULAR | Status: AC
  Filled 2015-10-22: qty 1

## 2015-10-22 MED ORDER — LIDOCAINE HCL (CARDIAC) 20 MG/ML IV SOLN
INTRAVENOUS | Status: DC | PRN
  Administered 2015-10-22: 100 mg via INTRAVENOUS

## 2015-10-22 MED ORDER — ROCURONIUM BROMIDE 100 MG/10ML IV SOLN
INTRAVENOUS | Status: AC
  Filled 2015-10-22: qty 1

## 2015-10-22 MED ORDER — DEXAMETHASONE SODIUM PHOSPHATE 10 MG/ML IJ SOLN
INTRAMUSCULAR | Status: AC
  Filled 2015-10-22: qty 1

## 2015-10-22 MED ORDER — HYDROMORPHONE HCL 1 MG/ML IJ SOLN
INTRAMUSCULAR | Status: AC
  Filled 2015-10-22: qty 1

## 2015-10-22 MED ORDER — ONDANSETRON HCL 4 MG PO TABS: 4.0000 mg | ORAL_TABLET | Freq: Four times a day (QID) | ORAL | Status: DC | PRN

## 2015-10-22 MED ORDER — CHLORHEXIDINE GLUCONATE CLOTH 2 % EX PADS: 6.0000 | MEDICATED_PAD | Freq: Once | CUTANEOUS | Status: DC

## 2015-10-22 MED ORDER — ONDANSETRON HCL 4 MG/2ML IJ SOLN: 4.0000 mg | Freq: Four times a day (QID) | INTRAMUSCULAR | Status: DC | PRN

## 2015-10-22 MED ORDER — ONDANSETRON HCL 4 MG/2ML IJ SOLN
INTRAMUSCULAR | Status: DC | PRN
  Administered 2015-10-22: 4 mg via INTRAVENOUS

## 2015-10-22 MED ORDER — PHENYLEPHRINE HCL 10 MG/ML IJ SOLN
INTRAMUSCULAR | Status: DC | PRN
  Administered 2015-10-22 (×5): 80 ug via INTRAVENOUS

## 2015-10-22 MED ORDER — MIDAZOLAM HCL 2 MG/2ML IJ SOLN
INTRAMUSCULAR | Status: AC
  Filled 2015-10-22: qty 2

## 2015-10-22 MED ORDER — HYDROMORPHONE HCL 1 MG/ML IJ SOLN
0.2500 mg | INTRAMUSCULAR | Status: DC | PRN
  Administered 2015-10-22 (×3): 0.25 mg via INTRAVENOUS

## 2015-10-22 MED ORDER — LACTATED RINGERS IV SOLN
INTRAVENOUS | Status: DC | PRN
  Administered 2015-10-22 (×4): via INTRAVENOUS

## 2015-10-22 MED ORDER — FENTANYL CITRATE (PF) 250 MCG/5ML IJ SOLN
INTRAMUSCULAR | Status: AC
  Filled 2015-10-22: qty 5

## 2015-10-22 MED ORDER — DEXTROSE 5 % IV SOLN
2.0000 g | Freq: Two times a day (BID) | INTRAVENOUS | Status: AC
  Administered 2015-10-22: 2 g via INTRAVENOUS
  Filled 2015-10-22 (×2): qty 2

## 2015-10-22 MED ORDER — FENTANYL CITRATE (PF) 100 MCG/2ML IJ SOLN
INTRAMUSCULAR | Status: DC | PRN
  Administered 2015-10-22 (×2): 50 ug via INTRAVENOUS
  Administered 2015-10-22 (×2): 100 ug via INTRAVENOUS
  Administered 2015-10-22 (×4): 50 ug via INTRAVENOUS

## 2015-10-22 MED ORDER — DULOXETINE HCL 60 MG PO CPEP
60.0000 mg | ORAL_CAPSULE | Freq: Every day | ORAL | Status: DC
  Administered 2015-10-23 – 2015-10-28 (×6): 60 mg via ORAL
  Filled 2015-10-22 (×6): qty 1

## 2015-10-22 MED ORDER — DEXAMETHASONE SODIUM PHOSPHATE 10 MG/ML IJ SOLN
INTRAMUSCULAR | Status: DC | PRN
  Administered 2015-10-22: 10 mg via INTRAVENOUS

## 2015-10-22 MED ORDER — PROPOFOL 10 MG/ML IV BOLUS
INTRAVENOUS | Status: DC | PRN
  Administered 2015-10-22: 200 mg via INTRAVENOUS

## 2015-10-22 MED ORDER — ESMOLOL HCL 100 MG/10ML IV SOLN
INTRAVENOUS | Status: DC | PRN
  Administered 2015-10-22 (×2): 20 mg via INTRAVENOUS

## 2015-10-22 MED ORDER — CEFOTETAN DISODIUM-DEXTROSE 2-2.08 GM-% IV SOLR
INTRAVENOUS | Status: AC
  Filled 2015-10-22: qty 50

## 2015-10-22 MED ORDER — HEPARIN SODIUM (PORCINE) 5000 UNIT/ML IJ SOLN
5000.0000 [IU] | Freq: Three times a day (TID) | INTRAMUSCULAR | Status: DC
  Administered 2015-10-22 – 2015-10-28 (×18): 5000 [IU] via SUBCUTANEOUS
  Filled 2015-10-22 (×20): qty 1

## 2015-10-22 MED ORDER — ACETAMINOPHEN 10 MG/ML IV SOLN
INTRAVENOUS | Status: DC | PRN
  Administered 2015-10-22: 1000 mg via INTRAVENOUS

## 2015-10-22 MED ORDER — HYDROMORPHONE HCL 1 MG/ML IJ SOLN
INTRAMUSCULAR | Status: DC | PRN
  Administered 2015-10-22 (×2): 0.5 mg via INTRAVENOUS

## 2015-10-22 MED ORDER — ONDANSETRON HCL 4 MG/2ML IJ SOLN
INTRAMUSCULAR | Status: AC
  Filled 2015-10-22: qty 2

## 2015-10-22 MED ORDER — ACETAMINOPHEN 10 MG/ML IV SOLN
INTRAVENOUS | Status: AC
  Filled 2015-10-22: qty 100

## 2015-10-22 MED ORDER — ALVIMOPAN 12 MG PO CAPS: 12.0000 mg | ORAL_CAPSULE | Freq: Once | ORAL | Status: DC

## 2015-10-22 MED ORDER — ESMOLOL HCL 100 MG/10ML IV SOLN
INTRAVENOUS | Status: AC
  Filled 2015-10-22: qty 10

## 2015-10-22 MED ORDER — LIDOCAINE HCL (CARDIAC) 20 MG/ML IV SOLN
INTRAVENOUS | Status: AC
  Filled 2015-10-22: qty 5

## 2015-10-22 MED ORDER — PHENYLEPHRINE 40 MCG/ML (10ML) SYRINGE FOR IV PUSH (FOR BLOOD PRESSURE SUPPORT)
PREFILLED_SYRINGE | INTRAVENOUS | Status: AC
Start: 2015-10-22 — End: 2015-10-22
  Filled 2015-10-22: qty 10

## 2015-10-22 MED ORDER — ALVIMOPAN 12 MG PO CAPS
12.0000 mg | ORAL_CAPSULE | Freq: Once | ORAL | Status: AC
  Administered 2015-10-22: 12 mg via ORAL
  Filled 2015-10-22: qty 1

## 2015-10-22 MED ORDER — DEXTROSE 5 % IV SOLN
2.0000 g | INTRAVENOUS | Status: AC
  Administered 2015-10-22: 2 g via INTRAVENOUS
  Filled 2015-10-22: qty 2

## 2015-10-22 MED ORDER — SUGAMMADEX SODIUM 200 MG/2ML IV SOLN
INTRAVENOUS | Status: DC | PRN
  Administered 2015-10-22: 150 mg via INTRAVENOUS

## 2015-10-22 MED ORDER — HYDROMORPHONE HCL 2 MG/ML IJ SOLN
INTRAMUSCULAR | Status: AC
  Filled 2015-10-22: qty 1

## 2015-10-22 MED ORDER — FENTANYL CITRATE (PF) 100 MCG/2ML IJ SOLN: 25.0000 ug | INTRAMUSCULAR | Status: DC | PRN

## 2015-10-22 MED ORDER — LABETALOL HCL 5 MG/ML IV SOLN
INTRAVENOUS | Status: DC | PRN
  Administered 2015-10-22 (×2): 2.5 mg via INTRAVENOUS

## 2015-10-22 MED ORDER — KCL IN DEXTROSE-NACL 20-5-0.45 MEQ/L-%-% IV SOLN
INTRAVENOUS | Status: DC
  Administered 2015-10-22: 100 mL/h via INTRAVENOUS
  Administered 2015-10-23: 02:00:00 via INTRAVENOUS
  Administered 2015-10-23: 100 mL/h via INTRAVENOUS
  Administered 2015-10-23: 12:00:00 via INTRAVENOUS
  Administered 2015-10-24: 100 mL/h via INTRAVENOUS
  Administered 2015-10-25 – 2015-10-26 (×3): via INTRAVENOUS
  Filled 2015-10-22 (×10): qty 1000

## 2015-10-22 MED ORDER — SUCCINYLCHOLINE CHLORIDE 20 MG/ML IJ SOLN
INTRAMUSCULAR | Status: DC | PRN
  Administered 2015-10-22: 80 mg via INTRAVENOUS

## 2015-10-22 SURGICAL SUPPLY — 86 items
APPLIER CLIP 5 13 M/L LIGAMAX5 (MISCELLANEOUS)
APPLIER CLIP ROT 10 11.4 M/L (STAPLE)
BLADE EXTENDED COATED 6.5IN (ELECTRODE) IMPLANT
BLADE HEX COATED 2.75 (ELECTRODE) ×2 IMPLANT
BLADE SURG 15 STRL LF DISP TIS (BLADE) ×1 IMPLANT
BLADE SURG 15 STRL SS (BLADE) ×1
BLADE SURG SZ10 CARB STEEL (BLADE) IMPLANT
CATH FOLEY 2WAY SLVR 30CC 20FR (CATHETERS) ×2 IMPLANT
CELLS DAT CNTRL 66122 CELL SVR (MISCELLANEOUS) IMPLANT
CHLORAPREP W/TINT 26ML (MISCELLANEOUS) ×2 IMPLANT
CLAMP ENDO BABCK 10MM (STAPLE) IMPLANT
CLIP APPLIE 5 13 M/L LIGAMAX5 (MISCELLANEOUS) IMPLANT
CLIP APPLIE ROT 10 11.4 M/L (STAPLE) IMPLANT
CONNECTOR 5 IN 1 STRAIGHT STRL (MISCELLANEOUS) IMPLANT
COVER MAYO STAND STRL (DRAPES) ×2 IMPLANT
COVER SURGICAL LIGHT HANDLE (MISCELLANEOUS) ×4 IMPLANT
DECANTER SPIKE VIAL GLASS SM (MISCELLANEOUS) ×2 IMPLANT
DEVICE TROCAR PUNCTURE CLOSURE (ENDOMECHANICALS) IMPLANT
DRAPE LAPAROSCOPIC ABDOMINAL (DRAPES) ×2 IMPLANT
DRAPE SHEET LG 3/4 BI-LAMINATE (DRAPES) IMPLANT
DRAPE WARM FLUID 44X44 (DRAPE) ×2 IMPLANT
DRSG PAD ABDOMINAL 8X10 ST (GAUZE/BANDAGES/DRESSINGS) ×2 IMPLANT
ELECT BLADE TIP CTD 4 INCH (ELECTRODE) ×2 IMPLANT
ELECT PENCIL ROCKER SW 15FT (MISCELLANEOUS) ×2 IMPLANT
ELECT REM PT RETURN 9FT ADLT (ELECTROSURGICAL) ×2
ELECTRODE REM PT RTRN 9FT ADLT (ELECTROSURGICAL) ×1 IMPLANT
GAUZE SPONGE 4X4 12PLY STRL (GAUZE/BANDAGES/DRESSINGS) ×4 IMPLANT
GLOVE BIOGEL M 8.0 STRL (GLOVE) ×4 IMPLANT
GLOVE BIOGEL PI IND STRL 7.0 (GLOVE) ×1 IMPLANT
GLOVE BIOGEL PI INDICATOR 7.0 (GLOVE) ×1
GOWN SPEC L4 XLG W/TWL (GOWN DISPOSABLE) ×2 IMPLANT
GOWN STRL REUS W/TWL LRG LVL3 (GOWN DISPOSABLE) ×2 IMPLANT
GOWN STRL REUS W/TWL XL LVL3 (GOWN DISPOSABLE) ×6 IMPLANT
HANDLE STAPLE EGIA 4 XL (STAPLE) ×2 IMPLANT
HANDLE SUCTION POOLE (INSTRUMENTS) IMPLANT
KIT BASIN OR (CUSTOM PROCEDURE TRAY) ×2 IMPLANT
LEGGING LITHOTOMY PAIR STRL (DRAPES) ×2 IMPLANT
LIGASURE IMPACT 36 18CM CVD LR (INSTRUMENTS) ×2 IMPLANT
LIQUID BAND (GAUZE/BANDAGES/DRESSINGS) ×2 IMPLANT
NS IRRIG 1000ML POUR BTL (IV SOLUTION) ×4 IMPLANT
RELOAD TRI 2.0 60 XTHK VAS SUL (STAPLE) ×2 IMPLANT
RTRCTR WOUND ALEXIS 18CM MED (MISCELLANEOUS)
SCISSORS LAP 5X35 DISP (ENDOMECHANICALS) ×2 IMPLANT
SET IRRIG TUBING LAPAROSCOPIC (IRRIGATION / IRRIGATOR) IMPLANT
SHEARS HARMONIC ACE PLUS 36CM (ENDOMECHANICALS) ×2 IMPLANT
SLEEVE GASTRECTOMY 36FR VISIGI (MISCELLANEOUS) ×2 IMPLANT
SLEEVE XCEL OPT CAN 5 100 (ENDOMECHANICALS) ×6 IMPLANT
SOLUTION ANTI FOG 6CC (MISCELLANEOUS) ×2 IMPLANT
SPONGE LAP 18X18 X RAY DECT (DISPOSABLE) ×6 IMPLANT
STAPLER VISISTAT 35W (STAPLE) IMPLANT
STRIP CLOSURE SKIN 1/2X4 (GAUZE/BANDAGES/DRESSINGS) IMPLANT
SUCTION POOLE HANDLE (INSTRUMENTS)
SUT NOVA NAB DX-16 0-1 5-0 T12 (SUTURE) ×8 IMPLANT
SUT PDS AB 1 CT1 27 (SUTURE) IMPLANT
SUT PDS AB 1 CTX 36 (SUTURE) IMPLANT
SUT PDS AB 3-0 SH 27 (SUTURE) ×2 IMPLANT
SUT PDS AB 4-0 SH 27 (SUTURE) ×4 IMPLANT
SUT PROLENE 2 0 KS (SUTURE) IMPLANT
SUT SILK 2 0 (SUTURE) ×1
SUT SILK 2 0 SH CR/8 (SUTURE) ×2 IMPLANT
SUT SILK 2-0 18XBRD TIE 12 (SUTURE) ×1 IMPLANT
SUT SILK 3 0 (SUTURE) ×1
SUT SILK 3 0 SH CR/8 (SUTURE) ×4 IMPLANT
SUT SILK 3-0 18XBRD TIE 12 (SUTURE) ×1 IMPLANT
SUT VIC AB 2-0 CT1 27 (SUTURE)
SUT VIC AB 2-0 CT1 27XBRD (SUTURE) IMPLANT
SUT VIC AB 2-0 SH 18 (SUTURE) ×2 IMPLANT
SUT VIC AB 3-0 PS2 18 (SUTURE)
SUT VIC AB 3-0 PS2 18XBRD (SUTURE) IMPLANT
SUT VIC AB 4-0 PS2 27 (SUTURE) ×2 IMPLANT
SUT VIC AB 4-0 SH 18 (SUTURE) IMPLANT
SUT VICRYL 0 UR6 27IN ABS (SUTURE) IMPLANT
SYR 30ML LL (SYRINGE) ×2 IMPLANT
SYR 50ML LL SCALE MARK (SYRINGE) ×2 IMPLANT
SYR BULB IRRIGATION 50ML (SYRINGE) ×2 IMPLANT
TAPE CLOTH SURG 4X10 WHT LF (GAUZE/BANDAGES/DRESSINGS) ×2 IMPLANT
TOWEL OR 17X26 10 PK STRL BLUE (TOWEL DISPOSABLE) ×4 IMPLANT
TRAY FOLEY W/METER SILVER 14FR (SET/KITS/TRAYS/PACK) ×2 IMPLANT
TRAY LAPAROSCOPIC (CUSTOM PROCEDURE TRAY) ×2 IMPLANT
TROCAR BLADELESS OPT 5 100 (ENDOMECHANICALS) ×2 IMPLANT
TROCAR XCEL NON-BLD 11X100MML (ENDOMECHANICALS) IMPLANT
TROCAR XCEL UNIV SLVE 11M 100M (ENDOMECHANICALS) IMPLANT
TUBING CONNECTING 10 (TUBING) ×2 IMPLANT
TUBING FILTER THERMOFLATOR (ELECTROSURGICAL) IMPLANT
YANKAUER SUCT BULB TIP 10FT TU (MISCELLANEOUS) ×4 IMPLANT
YANKAUER SUCT BULB TIP NO VENT (SUCTIONS) IMPLANT

## 2015-10-22 NOTE — Op Note (Signed)
[name], RAKOWSKI NO.:  [id number]  MEDICAL RECORD NO.:  [id number]  LOCATION:  [facility]                         FACILITY:  [facility]  PHYSICIAN:  Isabel Caprice. 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  PROCEDURE:  Laparoscopic enterolysis, laparotomy with removal of left ovarian cyst, takedown of Hartmann procedure with hand-sewn 2 layer sigmoid to rectal 2-layer anastomosis.  SURGEON:  Isabel Caprice. Hassell Done, MD  ASSISTANT:  Darene Lamer. Hoxworth, M.D.  ANESTHESIA:  General endotracheal.  DESCRIPTION OF PROCEDURE:  The patient was taken to room 4 and given general anesthesia.  The abdomen was prepped with PCMX and draped sterilely.  She was in lithotomy and her arms were not tucked.  Access to the abdomen was achieved through the right upper quadrant with a 5-mm Optiview and a separate 5 mm was placed in the right lower quadrant. Sharp enterolysis was performed to bring down the small bowel which was densely adherent to the anterior abdominal wall.  This was carried over around the colostomy.  A separate 5 was placed in the left upper quadrant.  I took down as many adhesions as possible and then elected to make a midline incision.  The small bowel was stuck down into the pelvis mainly where the terminal ileum was stuck on the end of the rectal stump.  The rectum itself had kind of double back and fused down near the peritoneal reflection.  We worked diligently for couple of hours trying to free this up.  We went below inserting the rigid proctoscope and it seemed like the rectal stump was telescoped around on itself and was difficult to free up.  I then went from above and placed Foley catheter and pulled up with the balloon and used that to kind of help me do some blunt dissection  around the anterior portion, just kind of get this straightened out.  After completing this, I went ahead and resected the tip of the rectosigmoid stump.  I divided the colostomy with the Endo GIA Tri-Staple black load, and then performed a hand-sewn side-to- end anastomosis.  I placed posterior row of 3-0 silks.  I opened along the antimesenteric border and then did an inner layer of running locking 4-0 PDS, carried anteriorly in [facility] fashion.  Second row anteriorly 3-0 silks were used to complete the second layer.  We insufflated from below and no bubbles were seen.  On our way into the pelvis, she had a large left ovarian cyst, which I was able to dissect free without rupturing it.  The ovarian pedicle was ligated with 2-0 Vicryl suture ligatures and then it was removed in toto and sent as such to Pathology.  The old ostomy was excised and that wound closed with interrupted #1 Novafil.  The wounds were irrigated and the abdomen was irrigated.  Sponge and needle counts were reported as correct.  The fascia was closed with interrupted #1 Novafil and the wounds were packed open because of the nature of the colostomy  and contamination.  The patient was taken to recovery room in satisfactory condition.     Isabel Caprice Hassell Done, MD     MBM/MEDQ  D:  10/22/2015  T:  10/22/2015  Job:  [id number]

## 2015-10-22 NOTE — H&P (Signed)
Chief Complaint:  Ostomy in place  History of Present Illness:  [name] is an 55 y.o. female who presents for ostomy takedown.  All questions have been answered.    Past Medical History  Diagnosis Date  . Depression     pt states when her husband passed away. now feels fine.  . Melanoma ([place]) more than 10 yrs ago    Chest   . Chronic back pain   . Diverticulitis   . Arthritis     oa left hip, sees dr Lyla Glassing    Past Surgical History  Procedure Laterality Date  . Melanoma excision    . Flexible sigmoidoscopy N/A 06/05/2015    Procedure: FLEXIBLE SIGMOIDOSCOPY;  Surgeon: Manus Gunning, MD;  Location: [facility];  Service: Gastroenterology;  Laterality: N/A;  . Partial colectomy N/A 07/15/2015    Procedure: LAPAROSCOPIC ASSISTED SIGMOID COLECTOMY WITH COLOSTOMY, HARTMANN PROCEDURE, WITH PLACEMENT OF WOUND VAC;  Surgeon: Johnathan Hausen, MD;  Location: [facility];  Service: General;  Laterality: N/A;  . Back surgery   20 yrs ago    lower  . Tonsillectomy  as child  . Abdominal hysterectomy  1989    Ovaries in place    Current Facility-Administered Medications  Medication Dose Route Frequency Provider Last Rate Last Dose  . cefoTEtan (CEFOTAN) 2 g in dextrose 5 % 50 mL IVPB  2 g Intravenous On Call to [place], MD      . Chlorhexidine Gluconate Cloth 2 % PADS 6 each  6 each Topical Once Johnathan Hausen, MD       And  . Chlorhexidine Gluconate Cloth 2 % PADS 6 each  6 each Topical Once Johnathan Hausen, MD       Facility-Administered Medications Ordered in Other Encounters  Medication Dose Route Frequency Provider Last Rate Last Dose  . lactated ringers infusion    Continuous PRN Glory Buff, CRNA       Ampicillin Family History  Problem Relation Age of Onset  . Hypertension Father   . Cancer Maternal Aunt     breast  . Cancer Maternal Uncle     leukemia  . Cancer Maternal Grandmother 40    breast  . Heart disease Neg Hx   . Stroke Neg Hx   .  Cancer Maternal Uncle     colon   Social History:   reports that she has been smoking Cigarettes.  She has a 30 pack-year smoking history. She has never used smokeless tobacco. She reports that she drinks alcohol. She reports that she does not use illicit drugs.   REVIEW OF SYSTEMS : Negative except for see problem list  Physical Exam:   Blood pressure 147/80, pulse 91, temperature 97.6 F (36.4 C), temperature source Oral, resp. rate 12, height 5\' 2"  (1.575 m), weight 54.148 kg (119 lb 6 oz), SpO2 100 %. Body mass index is 21.83 kg/(m^2).  Gen:  [facility] WF NAD  Neurological: Alert and oriented to person, place, and time. Motor and sensory function is grossly intact  Head: Normocephalic and atraumatic.  Eyes: Conjunctivae are normal. Pupils are equal, round, and reactive to light. No scleral icterus.  Neck: Normal range of motion. Neck supple. No tracheal deviation or thyromegaly present.  Cardiovascular:  SR without murmurs or gallops.  No carotid bruits Breast:  Not examined Respiratory: Effort normal.  No respiratory distress. No chest wall tenderness. Breath sounds normal.  No wheezes, rales or rhonchi.  Abdomen:  Ostomy in the LLQ  GU:  Vaginal discharge has stopped Musculoskeletal: Normal range of motion. Extremities are nontender. No cyanosis, edema or clubbing noted Lymphadenopathy: No cervical, preauricular, postauricular or axillary adenopathy is present Skin: Skin is warm and dry. No rash noted. No diaphoresis. No erythema. No pallor. Pscyh: Normal mood and affect. Behavior is normal. Judgment and thought content normal.   LABORATORY RESULTS: No results found for this or any previous visit (from the past 48 hour(s)).   RADIOLOGY RESULTS: No results found.  Problem List: Patient Active Problem List   Diagnosis Date Noted  . Colonic diverticular abscess   . 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 Large bowel perforation ([place])   . Palliative care  encounter 06/06/2015  . DNR (do not resuscitate) 06/06/2015  . Colovaginal fistula   . Colovesical fistula   . Pelvic abscess in female   . Malnutrition of moderate degree ([place]) 06/04/2015  . Perforated sigmoid colon ([place]) 06/03/2015  . Diarrhea 05/28/2015  . Arthritis 05/20/2015  . Melanoma of skin ([place]) 05/20/2015  . Depression 05/20/2015    Assessment & Plan: Ostomy in place.  For lap assisted takedown    Matt B. Hassell Done, MD, [facility] [phone] beeper [phone]  10/22/2015 8:26 AM

## 2015-10-22 NOTE — Brief Op Note (Signed)
10/22/2015  1:26 PM  PATIENT:  [name]  55 y.o. female  PRE-OPERATIVE DIAGNOSIS:  OSTOMY IN PLACE  POST-OPERATIVE DIAGNOSIS:  OSTOMY IN PLACE  PROCEDURE:  Procedure(s): LAPAROSCOPY WITH INTEROLYSIS, CLOSURE OF HARTMANS POUCH, REMOVAL OF LEFT OVARIAN CYST (N/A)  SURGEON:  Surgeon(s) and Role:    * Johnathan Hausen, MD - Primary    * Excell Seltzer, MD - Assisting  PHYSICIAN ASSISTANT:   ASSISTANTS: Adonis Housekeeper, MD, FACS   ANESTHESIA:   general  EBL:  Total I/O In: [id number] [I.V.:3400; IV Piggyback:50] Out: 350 [Urine:200; Blood:150]  BLOOD ADMINISTERED:none  DRAINS: none   LOCAL MEDICATIONS USED:  NONE  SPECIMEN:  Source of Specimen:  left ovary  DISPOSITION OF SPECIMEN:  PATHOLOGY  COUNTS:  YES  TOURNIQUET:  * No tourniquets in log *  DICTATION: .Other Dictation: Dictation Number [id number]  PLAN OF CARE: Admit to inpatient   PATIENT DISPOSITION:  PACU - hemodynamically stable.   Delay start of Pharmacological VTE agent (>24hrs) due to surgical blood loss or risk of bleeding: yes

## 2015-10-22 NOTE — Anesthesia Procedure Notes (Signed)
Procedure Name: Intubation Date/Time: 10/22/2015 8:37 AM Performed by: Maxwell Caul Pre-anesthesia Checklist: Patient identified, Emergency Drugs available, Suction available and Patient being monitored Patient Re-evaluated:Patient Re-evaluated prior to inductionOxygen Delivery Method: Circle System Utilized Preoxygenation: Pre-oxygenation with 100% oxygen Intubation Type: IV induction Ventilation: Mask ventilation without difficulty Laryngoscope Size: Mac and 4 Grade View: Grade I Tube type: Oral Tube size: 7.0 mm Number of attempts: 1 Airway Equipment and Method: Stylet Placement Confirmation: ETT inserted through vocal cords under direct vision,  positive ETCO2 and breath sounds checked- equal and bilateral Secured at: 20 cm Tube secured with: Tape Dental Injury: Teeth and Oropharynx as per pre-operative assessment

## 2015-10-22 NOTE — Interval H&P Note (Signed)
History and Physical Interval Note:  10/22/2015 8:27 AM  [name]  has presented today for surgery, with the diagnosis of OSTOMY IN PLACE  The various methods of treatment have been discussed with the patient and family. After consideration of risks, benefits and other options for treatment, the patient has consented to  Procedure(s): LAPAROSCOPIC COLOSTOMY TAKEDOWN (N/A) as a surgical intervention .  The patient's history has been reviewed, patient examined, no change in status, stable for surgery.  I have reviewed the patient's chart and labs.  Questions were answered to the patient's satisfaction.     Rushil Kimbrell B

## 2015-10-22 NOTE — Anesthesia Postprocedure Evaluation (Signed)
Anesthesia Post Note  Patient: [name]  Procedure(s) Performed: Procedure(s) (LRB): LAPAROSCOPY WITH INTEROLYSIS, CLOSURE OF HARTMANS POUCH, REMOVAL OF LEFT OVARIAN CYST (N/A)  Patient location during evaluation: PACU Anesthesia Type: General Level of consciousness: awake and alert Pain management: pain level controlled Vital Signs Assessment: post-procedure vital signs reviewed and stable Respiratory status: spontaneous breathing, nonlabored ventilation, respiratory function stable and patient connected to nasal cannula oxygen Cardiovascular status: blood pressure returned to baseline and stable Postop Assessment: no signs of nausea or vomiting Anesthetic complications: no    Last Vitals:  Filed Vitals:   10/22/15 1532 10/22/15 1630  BP: 112/68 119/68  Pulse: 98 96  Temp: 36.4 C 36.4 C  Resp: 12 12    Last Pain:  Filed Vitals:   10/22/15 1645  PainSc: 9                  [name] L

## 2015-10-22 NOTE — Anesthesia Preprocedure Evaluation (Signed)
Anesthesia Evaluation  Patient identified by MRN, date of birth, ID band Patient awake    Reviewed: Allergy & Precautions, NPO status , Patient's Chart, lab work & pertinent test results  History of Anesthesia Complications Negative for: history of anesthetic complications  Airway Mallampati: II  TM Distance: >3 FB Neck ROM: Full    Dental no notable dental hx. (+) Dental Advisory Given   Pulmonary Current Smoker,    Pulmonary exam normal breath sounds clear to auscultation       Cardiovascular negative cardio ROS Normal cardiovascular exam Rhythm:Regular Rate:Normal     Neuro/Psych PSYCHIATRIC DISORDERS Anxiety Depression negative neurological ROS     GI/Hepatic negative GI ROS, Neg liver ROS,   Endo/Other  negative endocrine ROS  Renal/GU negative Renal ROS  negative genitourinary   Musculoskeletal  (+) Arthritis ,   Abdominal   Peds negative pediatric ROS (+)  Hematology negative hematology ROS (+)   Anesthesia Other Findings Chest melanoma  Reproductive/Obstetrics negative OB ROS                             Anesthesia Physical Anesthesia Plan  ASA: III  Anesthesia Plan: General   Post-op Pain Management:    Induction: Intravenous  Airway Management Planned: Oral ETT  Additional Equipment:   Intra-op Plan:   Post-operative Plan: Extubation in OR  Informed Consent: I have reviewed the patients History and Physical, chart, labs and discussed the procedure including the risks, benefits and alternatives for the proposed anesthesia with the patient or authorized representative who has indicated his/her understanding and acceptance.   Dental advisory given  Plan Discussed with: CRNA  Anesthesia Plan Comments:         Anesthesia Quick Evaluation

## 2015-10-22 NOTE — Transfer of Care (Signed)
Immediate Anesthesia Transfer of Care Note  Patient: [name]  Procedure(s) Performed: Procedure(s): LAPAROSCOPY WITH INTEROLYSIS, CLOSURE OF HARTMANS POUCH, REMOVAL OF LEFT OVARIAN CYST (N/A)  Patient Location: PACU  Anesthesia Type:General  Level of Consciousness:  sedated, patient cooperative and responds to stimulation  Airway & Oxygen Therapy:Patient Spontanous Breathing and Patient connected to face mask oxgen  Post-op Assessment:  Report given to PACU RN and Post -op Vital signs reviewed and stable  Post vital signs:  Reviewed and stable  Last Vitals:  Filed Vitals:   10/22/15 0640 10/22/15 1315  BP: 147/80 141/76  Pulse: 91 99  Temp: 36.4 C 37 C  Resp: 12 12    Complications: No apparent anesthesia complications

## 2015-10-23 LAB — BASIC METABOLIC PANEL
ANION GAP: 7 (ref 5–15)
BUN: 12 mg/dL (ref 6–20)
CHLORIDE: 103 mmol/L (ref 101–111)
CO2: 28 mmol/L (ref 22–32)
CREATININE: 0.56 mg/dL (ref 0.44–1.00)
Calcium: 8.3 mg/dL — ABNORMAL LOW (ref 8.9–10.3)
GFR calc non Af Amer: >60 mL/min (ref >60)
Glucose, Bld: 138 mg/dL — ABNORMAL HIGH (ref 65–99)
Potassium: 4 mmol/L (ref 3.5–5.1)
SODIUM: 138 mmol/L (ref 135–145)

## 2015-10-23 LAB — CBC
HCT: 29.6 % — ABNORMAL LOW (ref 36.0–46.0)
HEMOGLOBIN: 9.8 g/dL — AB (ref 12.0–15.0)
MCH: 30.2 pg (ref 26.0–34.0)
MCHC: 33.1 g/dL (ref 30.0–36.0)
MCV: 91.1 fL (ref 78.0–100.0)
PLATELETS: 289 10*3/uL (ref 150–400)
RBC: 3.25 MIL/uL — AB (ref 3.87–5.11)
RDW: 16.4 % — ABNORMAL HIGH (ref 11.5–15.5)
WBC: 16.5 10*3/uL — AB (ref 4.0–10.5)

## 2015-10-23 MED ORDER — METHOCARBAMOL 1000 MG/10ML IJ SOLN
1000.0000 mg | Freq: Three times a day (TID) | INTRAVENOUS | Status: DC | PRN
  Administered 2015-10-25 – 2015-10-26 (×2): 1000 mg via INTRAVENOUS
  Filled 2015-10-23 (×5): qty 10

## 2015-10-23 MED ORDER — MORPHINE SULFATE (PF) 2 MG/ML IV SOLN
2.0000 mg | INTRAVENOUS | Status: DC | PRN
  Administered 2015-10-23 (×3): 2 mg via INTRAVENOUS
  Administered 2015-10-23 – 2015-10-24 (×5): 4 mg via INTRAVENOUS
  Filled 2015-10-23: qty 1
  Filled 2015-10-23: qty 2
  Filled 2015-10-23: qty 1
  Filled 2015-10-23 (×3): qty 2
  Filled 2015-10-23: qty 1
  Filled 2015-10-23: qty 2
  Filled 2015-10-23: qty 1

## 2015-10-23 NOTE — Progress Notes (Signed)
Patient ID: [name], female   DOB: 02/07/1961, 54 y.o.   MRN: [id number] [facility] Progress Note:   1 Day Post-Op  Subjective: Mental status is clear.  Has been up voiding Objective: Vital signs in last 24 hours: Temp:  [97.5 F (36.4 C)-98.6 F (37 C)] 98.4 F (36.9 C) (02/02 0521) Pulse Rate:  [96-104] 104 (02/02 0521) Resp:  [11-14] 14 (02/02 0521) BP: (111-141)/(66-77) 124/70 mmHg (02/02 0521) SpO2:  [95 %-100 %] 100 % (02/02 0521)  Intake/Output from previous day: 02/01 0701 - 02/02 0700 In: 5018.3 [I.V.:4968.3; IV Piggyback:50] Out: 1260 [Urine:1110; Blood:150] Intake/Output this shift: Total I/O In: -  Out: 200 [Urine:200]  Physical Exam: Work of breathing is not labored.  Wounds packed.  Will begin redressing tomorrow.  Will consult wound therapy about VAC  Lab Results:  Results for orders placed or performed during the hospital encounter of 10/22/15 (from the past 48 hour(s))  CBC     Status: Abnormal   Collection Time: 10/22/15  3:18 PM  Result Value Ref Range   WBC 16.5 (H) 4.0 - 10.5 K/uL   RBC 3.89 3.87 - 5.11 MIL/uL   Hemoglobin 11.6 (L) 12.0 - 15.0 g/dL   HCT 35.5 (L) 36.0 - 46.0 %   MCV 91.3 78.0 - 100.0 fL   MCH 29.8 26.0 - 34.0 pg   MCHC 32.7 30.0 - 36.0 g/dL   RDW 16.4 (H) 11.5 - 15.5 %   Platelets 324 150 - 400 K/uL  Creatinine, serum     Status: None   Collection Time: 10/22/15  3:18 PM  Result Value Ref Range   Creatinine, Ser 0.61 0.44 - 1.00 mg/dL   GFR calc non Af Amer >60 >60 mL/min   GFR calc Af Amer >60 >60 mL/min    Comment: (NOTE) The eGFR has been calculated using the CKD EPI equation. This calculation has not been validated in all clinical situations. eGFR's persistently <60 mL/min signify possible Chronic Kidney Disease.   Basic metabolic panel     Status: Abnormal   Collection Time: 10/23/15  4:24 AM  Result Value Ref Range   Sodium 138 135 - 145 mmol/L   Potassium 4.0 3.5 - 5.1 mmol/L   Chloride 103 101 -  111 mmol/L   CO2 28 22 - 32 mmol/L   Glucose, Bld 138 (H) 65 - 99 mg/dL   BUN 12 6 - 20 mg/dL   Creatinine, Ser 0.56 0.44 - 1.00 mg/dL   Calcium 8.3 (L) 8.9 - 10.3 mg/dL   GFR calc non Af Amer >60 >60 mL/min   GFR calc Af Amer >60 >60 mL/min    Comment: (NOTE) The eGFR has been calculated using the CKD EPI equation. This calculation has not been validated in all clinical situations. eGFR's persistently <60 mL/min signify possible Chronic Kidney Disease.    Anion gap 7 5 - 15  CBC     Status: Abnormal   Collection Time: 10/23/15  4:24 AM  Result Value Ref Range   WBC 16.5 (H) 4.0 - 10.5 K/uL   RBC 3.25 (L) 3.87 - 5.11 MIL/uL   Hemoglobin 9.8 (L) 12.0 - 15.0 g/dL   HCT 29.6 (L) 36.0 - 46.0 %   MCV 91.1 78.0 - 100.0 fL   MCH 30.2 26.0 - 34.0 pg   MCHC 33.1 30.0 - 36.0 g/dL   RDW 16.4 (H) 11.5 - 15.5 %   Platelets 289 150 - 400 K/uL    Radiology/Results: No   results found.  Anti-infectives: Anti-infectives    Start     Dose/Rate Route Frequency Ordered Stop   10/22/15 2100  cefoTEtan (CEFOTAN) 2 g in dextrose 5 % 50 mL IVPB     2 g 100 mL/hr over 30 Minutes Intravenous Every 12 hours 10/22/15 1335 10/22/15 2321   10/22/15 0711  cefoTEtan (CEFOTAN) 2 g in dextrose 5 % 50 mL IVPB     2 g 100 mL/hr over 30 Minutes Intravenous On call to O.R. 10/22/15 0711 10/22/15 0845      Assessment/Plan: Problem List: Patient Active Problem List   Diagnosis Date Noted  . S/P colectomy 10/22/2015  . Colonic diverticular abscess   . 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 Large bowel perforation (HCC)   . Palliative care encounter 06/06/2015  . DNR (do not resuscitate) 06/06/2015  . Colovaginal fistula   . Colovesical fistula   . Pelvic abscess in female   . Malnutrition of moderate degree (HCC) 06/04/2015  . Perforated sigmoid colon (HCC) 06/03/2015  . Diarrhea 05/28/2015  . Arthritis 05/20/2015  . Melanoma of skin (HCC) 05/20/2015  . Depression 05/20/2015     Stable postop.   1 Day Post-Op    LOS: 1 day   Matt B. Martin, MD, FACS  [facility] [phone] beeper [phone]  10/23/2015 9:21 AM  

## 2015-10-23 NOTE — Consult Note (Signed)
WOC wound consult note Reason for Consult: placement of NPWT VAC therapy  Wound type:surgical midline and takedown site Measurement: Midline abdominal: 11cm x 2.5cm x 2cm  LLQ takedown site: 1.5cm x 4cm  x2cm  Wound bed: both clean, moist, subcutaneous tissue Drainage (amount, consistency, odor) moderate, serosanguinous  Periwound: intact  Dressing procedure/placement/frequency: Protected skin between takedown site and midline wound with VAC drape. 1pc of black foam used to fill the midline wound and 1pc used to fill the takedown site.  1pc of black foam used to bridge the two sites. Seal at 171mmHG, patient tolerated well. Received IV pain meds prior to dressing change.  Bedside nurse to change T/Th/Sat [place], [place]

## 2015-10-23 NOTE — Care Management Note (Signed)
Case Management Note  Patient Details  Name: [name] MRN: [id number] Date of Birth: [birth year]  Subjective/Objective:    Laparoscopic enterolysis, laparotomy with removal of left ovarian cyst, takedown of Hartmann procedure                 Action/Plan: Discharge planning, spoke with patient and friend at bedside. Vac applied by [facility] nurse today. Patient plans to d/c to address in [place] ([address] [place] ) and is agreeable to [facility] whom she previously used for ostomy support. Requesting the same nurse she had then. Notified [facility] of referral.    Expected Discharge Date:  10/28/15               Expected Discharge Plan:  [place]  In-House Referral:  NA  Discharge planning Services  CM Consult  Post Acute Care Choice:  Durable Medical Equipment, Home Health Choice offered to:  Patient  DME Arranged:  Vac DME Agency:  [facility]  HH Arranged:  RN, Disease Management [place] Agency:  [facility]  Status of Service:  In process, will continue to follow  Medicare Important Message Given:    Date Medicare IM Given:    Medicare IM give by:    Date Additional Medicare IM Given:    Additional Medicare Important Message give by:     If discussed at [place] of Stay Meetings, dates discussed:    Additional Comments:  Guadalupe Maple, RN 10/23/2015, 11:21 AM

## 2015-10-24 LAB — BASIC METABOLIC PANEL
Anion gap: 4 — ABNORMAL LOW (ref 5–15)
BUN: <5 mg/dL — ABNORMAL LOW (ref 6–20)
CALCIUM: 8.8 mg/dL — AB (ref 8.9–10.3)
CHLORIDE: 100 mmol/L — AB (ref 101–111)
CO2: 30 mmol/L (ref 22–32)
CREATININE: 0.54 mg/dL (ref 0.44–1.00)
GFR calc Af Amer: >60 mL/min (ref >60)
GFR calc non Af Amer: >60 mL/min (ref >60)
GLUCOSE: 134 mg/dL — AB (ref 65–99)
Potassium: 4.7 mmol/L (ref 3.5–5.1)
Sodium: 134 mmol/L — ABNORMAL LOW (ref 135–145)

## 2015-10-24 LAB — CBC WITH DIFFERENTIAL/PLATELET
Basophils Absolute: 0 10*3/uL (ref 0.0–0.1)
Basophils Relative: 0 %
Eosinophils Absolute: 0 10*3/uL (ref 0.0–0.7)
Eosinophils Relative: 0 %
HEMATOCRIT: 28.6 % — AB (ref 36.0–46.0)
HEMOGLOBIN: 9.2 g/dL — AB (ref 12.0–15.0)
LYMPHS ABS: 1.5 10*3/uL (ref 0.7–4.0)
LYMPHS PCT: 12 %
MCH: 29.7 pg (ref 26.0–34.0)
MCHC: 32.2 g/dL (ref 30.0–36.0)
MCV: 92.3 fL (ref 78.0–100.0)
MONO ABS: 0.7 10*3/uL (ref 0.1–1.0)
MONOS PCT: 6 %
NEUTROS ABS: 10.4 10*3/uL — AB (ref 1.7–7.7)
Neutrophils Relative %: 82 %
Platelets: 289 10*3/uL (ref 150–400)
RBC: 3.1 MIL/uL — ABNORMAL LOW (ref 3.87–5.11)
RDW: 16.9 % — AB (ref 11.5–15.5)
WBC: 12.7 10*3/uL — ABNORMAL HIGH (ref 4.0–10.5)

## 2015-10-24 MED ORDER — HYDROMORPHONE HCL 1 MG/ML IJ SOLN
0.5000 mg | INTRAMUSCULAR | Status: DC | PRN
  Administered 2015-10-24 – 2015-10-27 (×21): 1 mg via INTRAVENOUS
  Filled 2015-10-24 (×21): qty 1

## 2015-10-24 NOTE — Progress Notes (Signed)
Patient ID: [name], female   DOB: 1961-07-05, 55 y.o.   MRN: [id number] [facility] Progress Note:   2 Days Post-Op  Subjective: Mental status is clear.   Objective: Vital signs in last 24 hours: Temp:  [97.9 F (36.6 C)-98.7 F (37.1 C)] 97.9 F (36.6 C) (02/03 0452) Pulse Rate:  [103-111] 105 (02/03 0452) Resp:  [16-18] 16 (02/03 0452) BP: (115-155)/(48-74) 152/67 mmHg (02/03 0452) SpO2:  [92 %-94 %] 94 % (02/03 0452)  Intake/Output from previous day: 02/02 0701 - 02/03 0700 In: 1170 [I.V.:1170] Out: 1675 [Urine:1675] Intake/Output this shift:    Physical Exam: Work of breathing is normal.  VAC in place,  Sore  Lab Results:  Results for orders placed or performed during the hospital encounter of 10/22/15 (from the past 48 hour(s))  CBC     Status: Abnormal   Collection Time: 10/22/15  3:18 PM  Result Value Ref Range   WBC 16.5 (H) 4.0 - 10.5 K/uL   RBC 3.89 3.87 - 5.11 MIL/uL   Hemoglobin 11.6 (L) 12.0 - 15.0 g/dL   HCT 35.5 (L) 36.0 - 46.0 %   MCV 91.3 78.0 - 100.0 fL   MCH 29.8 26.0 - 34.0 pg   MCHC 32.7 30.0 - 36.0 g/dL   RDW 16.4 (H) 11.5 - 15.5 %   Platelets 324 150 - 400 K/uL  Creatinine, serum     Status: None   Collection Time: 10/22/15  3:18 PM  Result Value Ref Range   Creatinine, Ser 0.61 0.44 - 1.00 mg/dL   GFR calc non Af Amer >60 >60 mL/min   GFR calc Af Amer >60 >60 mL/min    Comment: (NOTE) The eGFR has been calculated using the CKD EPI equation. This calculation has not been validated in all clinical situations. eGFR's persistently <60 mL/min signify possible Chronic Kidney Disease.   Basic metabolic panel     Status: Abnormal   Collection Time: 10/23/15  4:24 AM  Result Value Ref Range   Sodium 138 135 - 145 mmol/L   Potassium 4.0 3.5 - 5.1 mmol/L   Chloride 103 101 - 111 mmol/L   CO2 28 22 - 32 mmol/L   Glucose, Bld 138 (H) 65 - 99 mg/dL   BUN 12 6 - 20 mg/dL   Creatinine, Ser 0.56 0.44 - 1.00 mg/dL   Calcium 8.3 (L)  8.9 - 10.3 mg/dL   GFR calc non Af Amer >60 >60 mL/min   GFR calc Af Amer >60 >60 mL/min    Comment: (NOTE) The eGFR has been calculated using the CKD EPI equation. This calculation has not been validated in all clinical situations. eGFR's persistently <60 mL/min signify possible Chronic Kidney Disease.    Anion gap 7 5 - 15  CBC     Status: Abnormal   Collection Time: 10/23/15  4:24 AM  Result Value Ref Range   WBC 16.5 (H) 4.0 - 10.5 K/uL   RBC 3.25 (L) 3.87 - 5.11 MIL/uL   Hemoglobin 9.8 (L) 12.0 - 15.0 g/dL   HCT 29.6 (L) 36.0 - 46.0 %   MCV 91.1 78.0 - 100.0 fL   MCH 30.2 26.0 - 34.0 pg   MCHC 33.1 30.0 - 36.0 g/dL   RDW 16.4 (H) 11.5 - 15.5 %   Platelets 289 150 - 400 K/uL  CBC with Differential/Platelet     Status: Abnormal   Collection Time: 10/24/15  4:54 AM  Result Value Ref Range   WBC 12.7 (  H) 4.0 - 10.5 K/uL   RBC 3.10 (L) 3.87 - 5.11 MIL/uL   Hemoglobin 9.2 (L) 12.0 - 15.0 g/dL   HCT 28.6 (L) 36.0 - 46.0 %   MCV 92.3 78.0 - 100.0 fL   MCH 29.7 26.0 - 34.0 pg   MCHC 32.2 30.0 - 36.0 g/dL   RDW 16.9 (H) 11.5 - 15.5 %   Platelets 289 150 - 400 K/uL   Neutrophils Relative % 82 %   Neutro Abs 10.4 (H) 1.7 - 7.7 K/uL   Lymphocytes Relative 12 %   Lymphs Abs 1.5 0.7 - 4.0 K/uL   Monocytes Relative 6 %   Monocytes Absolute 0.7 0.1 - 1.0 K/uL   Eosinophils Relative 0 %   Eosinophils Absolute 0.0 0.0 - 0.7 K/uL   Basophils Relative 0 %   Basophils Absolute 0.0 0.0 - 0.1 K/uL  Basic metabolic panel     Status: Abnormal   Collection Time: 10/24/15  4:54 AM  Result Value Ref Range   Sodium 134 (L) 135 - 145 mmol/L   Potassium 4.7 3.5 - 5.1 mmol/L   Chloride 100 (L) 101 - 111 mmol/L   CO2 30 22 - 32 mmol/L   Glucose, Bld 134 (H) 65 - 99 mg/dL   BUN <5 (L) 6 - 20 mg/dL   Creatinine, Ser 0.54 0.44 - 1.00 mg/dL   Calcium 8.8 (L) 8.9 - 10.3 mg/dL   GFR calc non Af Amer >60 >60 mL/min   GFR calc Af Amer >60 >60 mL/min    Comment: (NOTE) The eGFR has been  calculated using the CKD EPI equation. This calculation has not been validated in all clinical situations. eGFR's persistently <60 mL/min signify possible Chronic Kidney Disease.    Anion gap 4 (L) 5 - 15    Radiology/Results: No results found.  Anti-infectives: Anti-infectives    Start     Dose/Rate Route Frequency Ordered Stop   10/22/15 2100  cefoTEtan (CEFOTAN) 2 g in dextrose 5 % 50 mL IVPB     2 g 100 mL/hr over 30 Minutes Intravenous Every 12 hours 10/22/15 1335 10/22/15 2321   10/22/15 0711  cefoTEtan (CEFOTAN) 2 g in dextrose 5 % 50 mL IVPB     2 g 100 mL/hr over 30 Minutes Intravenous On call to O.R. 10/22/15 0711 10/22/15 0845      Assessment/Plan: Problem List: Patient Active Problem List   Diagnosis Date Noted  . S/P colectomy 10/22/2015  . Colonic diverticular abscess   . 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 Large bowel perforation ([place])   . Palliative care encounter 06/06/2015  . DNR (do not resuscitate) 06/06/2015  . Colovaginal fistula   . Colovesical fistula   . Pelvic abscess in female   . Malnutrition of moderate degree ([place]) 06/04/2015  . Perforated sigmoid colon ([place]) 06/03/2015  . Diarrhea 05/28/2015  . Arthritis 05/20/2015  . Melanoma of skin ([place]) 05/20/2015  . Depression 05/20/2015    Path:  Benign cystadenoma.  Some scant flatus.  Offer clears 2 Days Post-Op    LOS: 2 days   Matt B. Hassell Done, MD, [facility] [phone] beeper [phone]  10/24/2015 8:25 AM

## 2015-10-24 NOTE — Progress Notes (Signed)
Vac application completed by attending, faxed application and clinicals to [facility] for insurance approval. Will await call back.

## 2015-10-25 LAB — CBC WITH DIFFERENTIAL/PLATELET
BASOS ABS: 0 10*3/uL (ref 0.0–0.1)
Basophils Relative: 0 %
EOS PCT: 1 %
Eosinophils Absolute: 0.1 10*3/uL (ref 0.0–0.7)
HCT: 31 % — ABNORMAL LOW (ref 36.0–46.0)
Hemoglobin: 10.3 g/dL — ABNORMAL LOW (ref 12.0–15.0)
LYMPHS PCT: 13 %
Lymphs Abs: 1.6 10*3/uL (ref 0.7–4.0)
MCH: 29.9 pg (ref 26.0–34.0)
MCHC: 33.2 g/dL (ref 30.0–36.0)
MCV: 90.1 fL (ref 78.0–100.0)
MONO ABS: 0.6 10*3/uL (ref 0.1–1.0)
Monocytes Relative: 5 %
Neutro Abs: 9.5 10*3/uL — ABNORMAL HIGH (ref 1.7–7.7)
Neutrophils Relative %: 81 %
PLATELETS: 330 10*3/uL (ref 150–400)
RBC: 3.44 MIL/uL — ABNORMAL LOW (ref 3.87–5.11)
RDW: 16.2 % — AB (ref 11.5–15.5)
WBC: 11.8 10*3/uL — ABNORMAL HIGH (ref 4.0–10.5)

## 2015-10-25 LAB — BASIC METABOLIC PANEL
Anion gap: 6 (ref 5–15)
CO2: 29 mmol/L (ref 22–32)
CREATININE: 0.44 mg/dL (ref 0.44–1.00)
Calcium: 9 mg/dL (ref 8.9–10.3)
Chloride: 98 mmol/L — ABNORMAL LOW (ref 101–111)
GFR calc Af Amer: >60 mL/min (ref >60)
GLUCOSE: 130 mg/dL — AB (ref 65–99)
POTASSIUM: 4.2 mmol/L (ref 3.5–5.1)
Sodium: 133 mmol/L — ABNORMAL LOW (ref 135–145)

## 2015-10-25 NOTE — Progress Notes (Signed)
Patient ID: [name], female   DOB: [birth year], 55 y.o.   MRN: [id number] [facility] Progress Note:   2 Days Post-Op  Subjective: Pt did not try clears yesterday.  Tolerating water.  Passing flatus Objective: Vital signs in last 24 hours: Temp:  [97.9 F (36.6 C)-98.8 F (37.1 C)] 97.9 F (36.6 C) (02/04 0523) Pulse Rate:  [77-95] 77 (02/04 0523) Resp:  [16-18] 16 (02/04 0523) BP: (126-144)/(61-78) 138/78 mmHg (02/04 0523) SpO2:  [94 %-97 %] 94 % (02/04 0523)  Intake/Output from previous day: 02/03 0701 - 02/04 0700 In: 1785.4 [I.V.:1785.4] Out: 1850 [Urine:1850] Intake/Output this shift:    Physical Exam: NAD Abd: soft, non-distended   VAC in place,  Sore  Lab Results:  Results for orders placed or performed during the hospital encounter of 10/22/15 (from the past 48 hour(s))  CBC with Differential/Platelet     Status: Abnormal   Collection Time: 10/24/15  4:54 AM  Result Value Ref Range   WBC 12.7 (H) 4.0 - 10.5 K/uL   RBC 3.10 (L) 3.87 - 5.11 MIL/uL   Hemoglobin 9.2 (L) 12.0 - 15.0 g/dL   HCT 28.6 (L) 36.0 - 46.0 %   MCV 92.3 78.0 - 100.0 fL   MCH 29.7 26.0 - 34.0 pg   MCHC 32.2 30.0 - 36.0 g/dL   RDW 16.9 (H) 11.5 - 15.5 %   Platelets 289 150 - 400 K/uL   Neutrophils Relative % 82 %   Neutro Abs 10.4 (H) 1.7 - 7.7 K/uL   Lymphocytes Relative 12 %   Lymphs Abs 1.5 0.7 - 4.0 K/uL   Monocytes Relative 6 %   Monocytes Absolute 0.7 0.1 - 1.0 K/uL   Eosinophils Relative 0 %   Eosinophils Absolute 0.0 0.0 - 0.7 K/uL   Basophils Relative 0 %   Basophils Absolute 0.0 0.0 - 0.1 K/uL  Basic metabolic panel     Status: Abnormal   Collection Time: 10/24/15  4:54 AM  Result Value Ref Range   Sodium 134 (L) 135 - 145 mmol/L   Potassium 4.7 3.5 - 5.1 mmol/L   Chloride 100 (L) 101 - 111 mmol/L   CO2 30 22 - 32 mmol/L   Glucose, Bld 134 (H) 65 - 99 mg/dL   BUN <5 (L) 6 - 20 mg/dL   Creatinine, Ser 0.54 0.44 - 1.00 mg/dL   Calcium 8.8 (L) 8.9 - 10.3 mg/dL    GFR calc non Af Amer >60 >60 mL/min   GFR calc Af Amer >60 >60 mL/min    Comment: (NOTE) The eGFR has been calculated using the CKD EPI equation. This calculation has not been validated in all clinical situations. eGFR's persistently <60 mL/min signify possible Chronic Kidney Disease.    Anion gap 4 (L) 5 - 15  CBC with Differential/Platelet     Status: Abnormal   Collection Time: 10/25/15  5:18 AM  Result Value Ref Range   WBC 11.8 (H) 4.0 - 10.5 K/uL   RBC 3.44 (L) 3.87 - 5.11 MIL/uL   Hemoglobin 10.3 (L) 12.0 - 15.0 g/dL   HCT 31.0 (L) 36.0 - 46.0 %   MCV 90.1 78.0 - 100.0 fL   MCH 29.9 26.0 - 34.0 pg   MCHC 33.2 30.0 - 36.0 g/dL   RDW 16.2 (H) 11.5 - 15.5 %   Platelets 330 150 - 400 K/uL   Neutrophils Relative % 81 %   Neutro Abs 9.5 (H) 1.7 - 7.7 K/uL   Lymphocytes  Relative 13 %   Lymphs Abs 1.6 0.7 - 4.0 K/uL   Monocytes Relative 5 %   Monocytes Absolute 0.6 0.1 - 1.0 K/uL   Eosinophils Relative 1 %   Eosinophils Absolute 0.1 0.0 - 0.7 K/uL   Basophils Relative 0 %   Basophils Absolute 0.0 0.0 - 0.1 K/uL  Basic metabolic panel     Status: Abnormal   Collection Time: 10/25/15  5:18 AM  Result Value Ref Range   Sodium 133 (L) 135 - 145 mmol/L   Potassium 4.2 3.5 - 5.1 mmol/L   Chloride 98 (L) 101 - 111 mmol/L   CO2 29 22 - 32 mmol/L   Glucose, Bld 130 (H) 65 - 99 mg/dL   BUN <5 (L) 6 - 20 mg/dL   Creatinine, Ser 0.44 0.44 - 1.00 mg/dL   Calcium 9.0 8.9 - 10.3 mg/dL   GFR calc non Af Amer >60 >60 mL/min   GFR calc Af Amer >60 >60 mL/min    Comment: (NOTE) The eGFR has been calculated using the CKD EPI equation. This calculation has not been validated in all clinical situations. eGFR's persistently <60 mL/min signify possible Chronic Kidney Disease.    Anion gap 6 5 - 15    Radiology/Results: No results found.  Anti-infectives: Anti-infectives    Start     Dose/Rate Route Frequency Ordered Stop   10/22/15 2100  cefoTEtan (CEFOTAN) 2 g in dextrose 5 % 50  mL IVPB     2 g 100 mL/hr over 30 Minutes Intravenous Every 12 hours 10/22/15 1335 10/22/15 2321   10/22/15 0711  cefoTEtan (CEFOTAN) 2 g in dextrose 5 % 50 mL IVPB     2 g 100 mL/hr over 30 Minutes Intravenous On call to O.R. 10/22/15 0711 10/22/15 0845      Assessment/Plan: Problem List: Patient Active Problem List   Diagnosis Date Noted  . S/P colectomy 10/22/2015  . Colonic diverticular abscess   . 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 Large bowel perforation ([place])   . Palliative care encounter 06/06/2015  . DNR (do not resuscitate) 06/06/2015  . Colovaginal fistula   . Colovesical fistula   . Pelvic abscess in female   . Malnutrition of moderate degree ([place]) 06/04/2015  . Perforated sigmoid colon ([place]) 06/03/2015  . Diarrhea 05/28/2015  . Arthritis 05/20/2015  . Melanoma of skin ([place]) 05/20/2015  . Depression 05/20/2015    Path:  Benign cystadenoma.  Some flatus.  Cont clears  2 Days Post-Op colostomy reversal   LOS: 3 days   Rosario Adie, MD  [facility] [place] [facility]   10/25/2015 9:40 AM

## 2015-10-26 MED ORDER — METHOCARBAMOL 500 MG PO TABS: 1000.0000 mg | ORAL_TABLET | Freq: Four times a day (QID) | ORAL | Status: DC | PRN

## 2015-10-26 MED ORDER — OXYCODONE HCL 5 MG PO TABS
5.0000 mg | ORAL_TABLET | ORAL | Status: DC | PRN
  Administered 2015-10-26 – 2015-10-28 (×11): 10 mg via ORAL
  Filled 2015-10-26 (×12): qty 2

## 2015-10-26 NOTE — Progress Notes (Signed)
Patient ID: [name], female   DOB: 29-Dec-1960, 55 y.o.   MRN: [id number] [facility] Surgery Progress Note:   2 Days Post-Op  Subjective: Pt tolerating clears and still having flatus.  No nausea Objective: Vital signs in last 24 hours: Temp:  [97.7 F (36.5 C)-97.9 F (36.6 C)] 97.9 F (36.6 C) (02/05 0600) Pulse Rate:  [67-79] 67 (02/05 0600) Resp:  [16] 16 (02/05 0600) BP: (116-145)/(65-68) 121/68 mmHg (02/05 0600) SpO2:  [95 %-97 %] 97 % (02/05 0600)  Intake/Output from previous day: 02/04 0701 - 02/05 0700 In: 2040 [P.O.:120; I.V.:1800; IV Piggyback:120] Out: 2325 [Urine:2325] Intake/Output this shift:    Physical Exam: NAD Abd: soft, non-distended   VAC in place,  Less sore  Lab Results:  Results for orders placed or performed during the hospital encounter of 10/22/15 (from the past 48 hour(s))  CBC with Differential/Platelet     Status: Abnormal   Collection Time: 10/25/15  5:18 AM  Result Value Ref Range   WBC 11.8 (H) 4.0 - 10.5 K/uL   RBC 3.44 (L) 3.87 - 5.11 MIL/uL   Hemoglobin 10.3 (L) 12.0 - 15.0 g/dL   HCT 31.0 (L) 36.0 - 46.0 %   MCV 90.1 78.0 - 100.0 fL   MCH 29.9 26.0 - 34.0 pg   MCHC 33.2 30.0 - 36.0 g/dL   RDW 16.2 (H) 11.5 - 15.5 %   Platelets 330 150 - 400 K/uL   Neutrophils Relative % 81 %   Neutro Abs 9.5 (H) 1.7 - 7.7 K/uL   Lymphocytes Relative 13 %   Lymphs Abs 1.6 0.7 - 4.0 K/uL   Monocytes Relative 5 %   Monocytes Absolute 0.6 0.1 - 1.0 K/uL   Eosinophils Relative 1 %   Eosinophils Absolute 0.1 0.0 - 0.7 K/uL   Basophils Relative 0 %   Basophils Absolute 0.0 0.0 - 0.1 K/uL  Basic metabolic panel     Status: Abnormal   Collection Time: 10/25/15  5:18 AM  Result Value Ref Range   Sodium 133 (L) 135 - 145 mmol/L   Potassium 4.2 3.5 - 5.1 mmol/L   Chloride 98 (L) 101 - 111 mmol/L   CO2 29 22 - 32 mmol/L   Glucose, Bld 130 (H) 65 - 99 mg/dL   BUN <5 (L) 6 - 20 mg/dL   Creatinine, Ser 0.44 0.44 - 1.00 mg/dL   Calcium 9.0 8.9 -  10.3 mg/dL   GFR calc non Af Amer >60 >60 mL/min   GFR calc Af Amer >60 >60 mL/min    Comment: (NOTE) The eGFR has been calculated using the CKD EPI equation. This calculation has not been validated in all clinical situations. eGFR's persistently <60 mL/min signify possible Chronic Kidney Disease.    Anion gap 6 5 - 15    Radiology/Results: No results found.  Anti-infectives: Anti-infectives    Start     Dose/Rate Route Frequency Ordered Stop   10/22/15 2100  cefoTEtan (CEFOTAN) 2 g in dextrose 5 % 50 mL IVPB     2 g 100 mL/hr over 30 Minutes Intravenous Every 12 hours 10/22/15 1335 10/22/15 2321   10/22/15 0711  cefoTEtan (CEFOTAN) 2 g in dextrose 5 % 50 mL IVPB     2 g 100 mL/hr over 30 Minutes Intravenous On call to O.R. 10/22/15 0711 10/22/15 0845      Assessment/Plan: Problem List: Patient Active Problem List   Diagnosis Date Noted  . S/P colectomy 10/22/2015  . Colonic diverticular  abscess   . 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 Large bowel perforation ([place])   . Palliative care encounter 06/06/2015  . DNR (do not resuscitate) 06/06/2015  . Colovaginal fistula   . Colovesical fistula   . Pelvic abscess in female   . Malnutrition of moderate degree ([place]) 06/04/2015  . Perforated sigmoid colon ([place]) 06/03/2015  . Diarrhea 05/28/2015  . Arthritis 05/20/2015  . Melanoma of skin ([place]) 05/20/2015  . Depression 05/20/2015   3 Days Post-Op colostomy reversal  Path:  Benign cystadenoma.    Having flatus but no BM.  Will advance diet and decrease IVF's.  Will start PO pain meds     LOS: 4 days   Rosario Adie, MD  Colorectal and [place] [facility]   10/26/2015 8:51 AM

## 2015-10-27 MED ORDER — ACETAMINOPHEN 500 MG PO TABS
1000.0000 mg | ORAL_TABLET | Freq: Four times a day (QID) | ORAL | Status: DC
  Administered 2015-10-27 – 2015-10-28 (×4): 1000 mg via ORAL
  Filled 2015-10-27 (×8): qty 2

## 2015-10-27 NOTE — Progress Notes (Addendum)
5 Days Post-Op  Subjective: She looks good and ask about going home.  No BM so far and she is still taking IV dilaudid.  Tolerating diet.    Objective: Vital signs in last 24 hours: Temp:  [97.9 F (36.6 C)-98 F (36.7 C)] 98 F (36.7 C) (02/06 0605) Pulse Rate:  [73-75] 75 (02/06 0605) Resp:  [18] 18 (02/06 0605) BP: (112-156)/(58-76) 113/58 mmHg (02/06 0605) SpO2:  [95 %-97 %] 97 % (02/06 0605) Last BM Date: 10/21/15 360 Po recorded  Diet:  soft Urine 1450 No BM recorded Afebrile, VSS NO labs/xrays Dilaudid 6 mg yesterday Oxy IR 40 mg yesterday Intake/Output from previous day: 02/05 0701 - 02/06 0700 In: 861.2 [P.O.:360; I.V.:501.2] Out: 1450 [Urine:1450] Intake/Output this shift:    General appearance: alert, cooperative and no distress Resp: clear to auscultation bilaterally and down some in the bases GI: soft sore, +BS, no Bm, wound vac in place.  Lab Results:   Recent Labs  10/25/15 0518  WBC 11.8*  HGB 10.3*  HCT 31.0*  PLT 330    BMET  Recent Labs  10/25/15 0518  NA 133*  K 4.2  CL 98*  CO2 29  GLUCOSE 130*  BUN <5*  CREATININE 0.44  CALCIUM 9.0   PT/INR No results for input(s): LABPROT, INR in the last 72 hours.  No results for input(s): AST, ALT, ALKPHOS, BILITOT, PROT, ALBUMIN in the last 168 hours.   Lipase  No results found for: LIPASE   Studies/Results: No results found.  Medications: . alvimopan  12 mg Oral BID  . DULoxetine  60 mg Oral Daily  . heparin subcutaneous  5,000 Units Subcutaneous 3 times per day   . dextrose 5 % and 0.45 % NaCl with KCl 20 mEq/L 10 mL/hr at 10/26/15 1935   Prior to Admission medications   Medication Sig Start Date End Date Taking? Authorizing Provider  acetaminophen (TYLENOL) 500 MG tablet Take 2 tablets (1,000 mg total) by mouth every 6 (six) hours. Patient taking differently: Take 1,000 mg by mouth every 6 (six) hours as needed (Pain).  07/21/15  Yes Earnstine Regal, PA-C  Cyanocobalamin  (VITAMIN B 12 PO) Take 1 tablet by mouth daily.    Yes Historical Provider, MD  DULoxetine (CYMBALTA) 60 MG capsule Take 1 capsule (60 mg total) by mouth daily. 08/29/15  Yes Edward Saguier, PA-C  ferrous sulfate 325 (65 FE) MG tablet You can buy this over the counter without a prescription.  Take one daily with supper after your bowel movements are more regular.  Start around 07/26/15. Patient taking differently: Take 325 mg by mouth daily.  07/21/15  Yes Earnstine Regal, PA-C  Prenatal Vit-Fe Fumarate-FA (PRENATAL MULTIVITAMIN) TABS tablet Take 1 tablet by mouth daily.    Yes Historical Provider, MD  saccharomyces boulardii (FLORASTOR) 250 MG capsule Take 1 capsule (250 mg total) by mouth 2 (two) times daily. 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 Hassell Done.  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 (hosptialized 9/13-9/19)  Tobacco use Quit September 2016,  Anemia  Hx of Melanoma Hx of depression Antibiotics:  None DVT:  SCD/Heparin  Plan:  I am going to add PO tylenol, I have ask her to try to taper IV Dilaudid.  Mobilize her more.  I think if she has a BM we can get her out.  Set up for home health and wound vac.  Recheck labs in AM.  Home soon.      LOS: 5 days    JENNINGS,WILLARD 10/27/2015  Agree with above. She's had a small BM, but I think with her history, it is worth her staying one more day. There are some questions about the [facility] and being discharged home on this.  These questions can be answered by Dr. Hassell Done when he is back tomorrow. Mother in room.  Alphonsa Overall, MD, [facility] Surgery Pager: [phone] Office phone:  [phone]

## 2015-10-28 LAB — CBC
HEMATOCRIT: 29 % — AB (ref 36.0–46.0)
HEMOGLOBIN: 9.6 g/dL — AB (ref 12.0–15.0)
MCH: 29.7 pg (ref 26.0–34.0)
MCHC: 33.1 g/dL (ref 30.0–36.0)
MCV: 89.8 fL (ref 78.0–100.0)
Platelets: 464 10*3/uL — ABNORMAL HIGH (ref 150–400)
RBC: 3.23 MIL/uL — ABNORMAL LOW (ref 3.87–5.11)
RDW: 16.4 % — AB (ref 11.5–15.5)
WBC: 12.5 10*3/uL — ABNORMAL HIGH (ref 4.0–10.5)

## 2015-10-28 LAB — COMPREHENSIVE METABOLIC PANEL
ALK PHOS: 52 U/L (ref 38–126)
ALT: 10 U/L — ABNORMAL LOW (ref 14–54)
ANION GAP: 8 (ref 5–15)
AST: 12 U/L — ABNORMAL LOW (ref 15–41)
Albumin: 3.2 g/dL — ABNORMAL LOW (ref 3.5–5.0)
BILIRUBIN TOTAL: 0.4 mg/dL (ref 0.3–1.2)
BUN: 7 mg/dL (ref 6–20)
CALCIUM: 9 mg/dL (ref 8.9–10.3)
CO2: 27 mmol/L (ref 22–32)
Chloride: 100 mmol/L — ABNORMAL LOW (ref 101–111)
Creatinine, Ser: 0.59 mg/dL (ref 0.44–1.00)
GFR calc non Af Amer: >60 mL/min (ref >60)
Glucose, Bld: 98 mg/dL (ref 65–99)
POTASSIUM: 3.8 mmol/L (ref 3.5–5.1)
Sodium: 135 mmol/L (ref 135–145)
TOTAL PROTEIN: 5.9 g/dL — AB (ref 6.5–8.1)

## 2015-10-28 MED ORDER — OXYCODONE HCL 5 MG PO TABS: 5.0000 mg | ORAL_TABLET | ORAL | Status: DC | PRN

## 2015-10-28 NOTE — Progress Notes (Signed)
Received a call from [employer] rep, wound vac has been approved for delivery, eta [id number]. Notified patient and nurse.

## 2015-10-28 NOTE — Progress Notes (Signed)
Updated clinicals sent to [facility], verified receipt. Awaiting approval and notification of delivery. Spoke with patient to make her aware of process and delay in d/c.

## 2015-10-28 NOTE — Discharge Instructions (Signed)
Open Colectomy An open colectomy is surgery to take out part or all of the large intestine (colon). This procedure is used to treat several conditions, including:  Inflammation and infection of the colon (diverticulitis).  Tumors or masses in the colon.  Inflammatory bowel disease, such as Crohn disease or ulcerative colitis. Colectomy is an option when symptoms cannot be controlled with medicines.  Bleeding from the colon that cannot be controlled by another method.  Blockage or obstruction of the colon. LET [facility] CARE PROVIDER KNOW ABOUT:  Any allergies you have.  All medicines you are taking, including vitamins, herbs, eye drops, creams, and over-the-counter medicines.  Previous problems you or members of your family have had with the use of anesthetics.  Any blood disorders you have.  Previous surgeries you have had.  Medical conditions you have. RISKS AND COMPLICATIONS  Generally, this is a safe procedure. However, as with any procedure, complications can occur. Possible complications include:  An infection developing in the area where the surgery was done.  Problems with the incisions, including:  Bleeding from an incision.  The wound reopening.  Tissues from inside the abdomen bulging through the incision (hernia).  Bleeding inside the abdomen.  Reopening of the colon where it was stitched or stapled together. This is a serious complication. Another procedure may be needed to fix the problem.  Damage to other organs in the abdomen.  A blood clot forming in a vein and traveling to the lungs.  Future blockage of the small intestine from scar tissue. Another surgery may be needed to repair this. BEFORE THE PROCEDURE  Various tests may be done before the procedure. These may include:  Blood tests.  A test to check the heart's rhythm (electrocardiography).  A CT scan to get pictures of your abdomen.  Ask your health care provider about changing or  stopping any regular medicines. You will need to stop taking aspirin and nonsteroidal anti-inflammatory drugs (NSAIDs) at least 5 days before the surgery. You will also need to stop taking any blood thinners and vitamin E.  You may be prescribed an oral bowel prep. This involves drinking a large amount of medicated liquid, starting the day before your surgery. The liquid will cause you to have multiple loose stools until your stool is almost clear or light green. This cleans out your colon in preparation for the surgery.  Do not eat or drink anything else once you have started the bowel prep, unless your health care provider tells you it is safe to do so.  You may also be given antibiotic pills to clean out your colon of bacteria. Be sure to follow the directions carefully and take the medicine at the correct time.  Make plans to have someone drive you home after your hospital stay. Also arrange for someone to help you with activities during your recovery. PROCEDURE This surgery can take 2-4 hours.  Small monitors will be put on your body. They are used to check your heart, blood pressure, and oxygen level.  An IV access tube will be put into one of your veins. Medicine will be able to flow directly into your body through this IV tube.  You might be given a medicine to help you relax (sedative).  You will be given a medicine to make you sleep through the procedure (general anesthetic). A breathing tube may be placed into your lungs during the procedure.  A thin, flexible tube (catheter) will be placed into your bladder to collect  urine.  A tube may be put in through your nose. It is called a nasogastric tube. It is used to remove stomach fluids after surgery until the intestines start working again.  Once you are asleep, the surgeon will make an incision in the abdomen.  Clamps or staples are put on both ends of the diseased part of the colon.  The part of the intestine between the  clamps or staples is removed.  If possible, the ends of the healthy colon that remain will be stitched or stapled together to allow your body to expel waste (stool).  Sometimes, the remaining colon cannot be stitched back together. If this is the case, a colostomy is needed. For a colostomy:  An opening (stoma) to the outside of your body is made through the abdomen.  The end of the colon is brought to the opening. It is stitched to the skin.  A bag is attached to the opening. Stool will drain into this bag. The bag is removable.  The colostomy can be temporary or permanent.  The incision from the colectomy will be closed with stitches or staples. AFTER THE PROCEDURE  You will stay in a recovery area until the anesthetic has worn off. Your blood pressure and pulse will be checked often. Then you will be taken to a hospital room.  You will continue to get fluids through the IV tube for a while. The IV tube will be taken out when the colon starts working again.  You will start on clear liquids and gradually go back to a normal diet.  You will be encouraged to cough and to take deep breaths to open your lungs and prevent pneumonia.  Some pain is normal after a colectomy. You will be given pain medicine as needed.  You will be urged to get up and start walking within a day.  If you had a colostomy, your health care provider will explain how it works and what you will need to do.  You will likely need to stay in the hospital for 3-7 days.   This information is not intended to replace advice given to you by your health care provider. Make sure you discuss any questions you have with your health care provider.   Document Released: 07/04/2009 Document Revised: 06/27/2013 Document Reviewed: 04/18/2013 Elsevier Interactive Patient Education 2016 [employer].  ???????? ?????????? (Open Colectomy) ???????? ?????????? ???????????? ????? ????????????? ???????? ?? ???????? ????? ??? ?????  ???????? ????????? (colon). ??? ????????? ???????????? ??? ??????? ?????????? ???????????, ? ??? ?????:  ?????????? ? ???????? ???????? ????????? (????????????).  ??????? ??? ?????? ??????????????? ? ??????? ?????????.  ??????? ???????????? ??????? ????? (??????? ????? ??? ???????? ?????). ?????????? ???????? ????????? ???????, ????? ??????????? ?? ??????? ????????? ???????? [????] ???????????.  ???????????? ?? ???????? ?????????, ????? ??? ?? ??????? ????????? ??????? ?????????.  ????????? ??? ?????????? ???????? ?????????. [????? ??????????] ??????? ???????? ???????? ?????:  ? ????? ????????? ? ??? ????????.  ? ???? ??????????? ???? ??????????, ??????? ????????, ?????????????, ??????? ?????, ????? ? ?????????????? ?????????.  ?? ????????? ??????? ??????? ?? ????????? ? ??? ??? ? ????? ??????? ?????????????.  ? ???? ???????????? ?????.  ? ????? ??????????? ?????????.  ? ?????? ????????? ? ??? ????????????. ????? ? ??????????  ??? ???????, ??? ?????????? ?????????. ??????, ??? ??? ????? ??????????? ?????????, ???????? ??????????. ?????????? ???????????? ????????:  ???????? ???????? ? ???????, ??? ??????????? ????????????? ?????????????.  ????????, ????????? ? ?????????????? ?????????, ? ??? ?????:  ???????????? ? ????? ???????.  ??????????? ????? ????.  ??????????? ????????????? ????? ?????? ?? ?????? (?????).  ???????????? ? ??????? ???????.  ??????????? ????? ???????? ????????? ? ??? ?????, ??? ?? ??? ????? ??? ???????? ????????. ??? ????????? ??????????. ??? ?????????? ???? ???????? ????? ????????????? ?????? ????????.  ??????????? ?????? ??????? ??????? ???????.  ??????????? ????????? ??????? (??????) ? ???? ? ??? ????????? ? ??????.  ?????????????? ? ??????? ??????? ????????? ?????????? ???????????? ???????? ?????. ????? ????????? ?????????????? ?????? ????? ????????????? ?????? ????????. ????? ??????????  ????? ??????????, ????????, ???????? ??????? ?????????  ???????. ????? ??? ????? ????:  ??????? ?????.  ???????????? ?????????? ????? (???-????????????).  ??-???????????? ??? ????????? ??????? ??????? ??????? ???????.  ??????????????????? ? ???????? ????? ? ????????????? ???????? ??? ???????? ????? ????? ??????? ????????. ??? ????? ????? ?????????? ????? ???????? ? ???????????? ????????????????????? ?????????? (????), ?? ??????? ????, ??  5 ???? ?? ????????. ?? ????? ?????? ?????????? ????????? ????? ????????? ??? ?????????? ????? ? ??????? ?.  ??? ????? ???????? ??????????? ????????? ??? ?????????? ?????????. ??? ????? ???? ??????? ?????????? ????????????? ????????, ??????? ???? ????? ????, ??????? ?? ????? ?? ????????. ??? ???????? ????? ?????????????? ????????????? ?????????? ?????, ???? ?? ?? ?????? ????? ?????????? ??? ??????-???????? ?????. ??? ????????? ???????? ??????? ???????? ? ?????? ?????????? ? ????????.  ?? ???????  ?????? ???? ??? ????, ??? ?????? ?? ?????? ?????????? ?????????, ???? ?????? ??? ???? ?? ??????????, ??? ????? ??????????? ?????????? ???????.  ??? ?????????? ???????? ?? ????????? ??? ????? ????? ?????? ??????????? ? ?????????. ??????????? ???????? ??????????? ? ?????????? ??? ????????? ? ?????? ?????.  ???????????? ? ???-??????, ????? ??? ??????? ????? ????? ?????? ?????????? ? ????????. ????? ??????? ???????????? ? ???-?????? ?????? ??? ?? ???? ?? ????? ?????????????. ????????? ??? ???????? ????? ??????? 2-4 ????.  ? ??? ?? ???? ????? ??????????? ????????? ???????. ??? ???????????? ??? ???????? ?????? ??????, ????????????? ???????? ? ?????? ????????? (? ?????).  ? ???? ?? ??? ??? ?????? ??????????? ??????. ????? ??? ?????? ??? ????? ??????????? ??????? ?????????.  ????? ?? ???????????? ? ???????????, ??? ????? ???? ?????????????? ????????(?????????? ????????).  ????? ?? ????? ?? ????? ???? ?????????, ??? ????? ????? ?????? (????? ?????????). ?? ????? ????????? ? ?????? ????? ???? ????????? ??????????? ???????????  ??????.  ??? ????? ???? ? ??????? ?????? ????? ??????? ?????? ?????? ?????? (???????).  ??? ???? ????? ??? ????? ???? ????????? ??????????? ??????. ?? ???????? ??????????????? ??????. ??? ???????????? ??? ???????? ??????????? ???? ????? ????????, ???? ???????? ????? ?? ?????? ????????? ???????????????.  ????? ????, ??? ?? ???????, ?????? ??????? ?????? ??????? ??????.  ? ????? ?????? ??????? ????? ???????? ????????? ????????? ?????? ??? ??????.  ????? ????????? ????? ???????? ??? ???????? ?????????.  ???? ??? ????????, ?????????? ????? ????????? ???????? ????????? ????? ????? ??? ????????? ??????, ????? ???????? ??? ????????????? ?? ??????? (???????????).  ?????? ?????????? ????? ???????? ????????? ?? ????? ???? ????? ??????. ? ???? ?????? ??????????? ??????????. ??? ?????????:  ? ?????? ?????? ??????????? ????????? (?????).  ????? ???????? ????????? ?????????? ? ????? ?????????. ??? ???? ??????? ? ?????.  ? ?????? ????????? ??????????? ?????. ??????????? ????? ????????? ? ???? ?????. ????? ???????? ???????.  ?????????? ????? ???? ????????? ??? ??????????.  ?????? ? ?????????? ????????? ????????? ????? ?????? ? ??????? ???? ??? ??????. ????? ?????????  ?? ?????? ?????????? ? ????????????????? ??????, ???? ?? ??????? ?????????. ? ??? ????? ????? ????????? ???????????? ???????? ? ?????. ????? ??? ????????? ? ?????????? ??????.  ????????? ????? ?? ?????????? ???????? ???????? ????? ??????????. ?????????? ??????, ????? ??????? ???????? ? ??? ????? ??????????.  ??????? ??? ????? ?????? ?????? ?????????? ????????, ? ?????????? ?? ????????? ? ??????????? ???????.  ??? ????? ?????????? ??????? ? ?????? ???????? ?????, ????? ??????? ??????? ?????????? ?????? ? ????? ???????????? ?????????.  ????? ?????????? ?????? ????????? ????????? ????. ??? ????????????? ??? ???????? ????? ?????????????? ??????????.  ??? ???????? ???????? ? ??????? ? ???????? ?????? ? ??????? ???.  ???? ??? ????????  ??????????, ??????? ???? ????????, ??? ??? ?????????, ? ??? ??? ????? ????? ??????.  ?????? ????? ??? ????? ????? ?????????? ? ???????? ? ??????? 3-7 ????.   ??? ?????????? ?? ????? ???????? ??????, ??????????????? ????? ??????. ??????????? ???????? ????? ???????????? ??? ??????? ? ????? ??????? ??????.   Document Released: 05/09/2013 Document Revised: 06/27/2013 Elsevier Interactive Patient Education [employer].

## 2015-10-28 NOTE — Progress Notes (Signed)
Discharge instructions given to patient; questions answered.--Donne Hazel, RN 10/28/15 1233

## 2015-10-28 NOTE — Discharge Summary (Signed)
Physician Discharge Summary  Patient ID: [name] MRN: [id number] DOB/AGE: [birth year] 57 y.o.  Admit date: 10/22/2015 Discharge date: 10/28/2015  Admission Diagnoses:  End colostomy with Jeanette Caprice pouch from prior colovaginal and colovesicle fistula  Discharge Diagnoses:  Same; lap enterolysis, takedown of Hartman with handsewn colorectal anastomsis  Active Problems:   S/P colectomy   Surgery:  Takedown of Hartman pouch with primary anastomsis;  Wound packed open secondary to contamination, patient's borderline nutritional status, and the exposed raw muscle and tendon in the wound necessitating a wound VAC  Discharged Condition: improved  Hospital Course:   Had surgery.  Packing removed and VAC applied.  Begun on liquids and diet advanced as tolerated.  VAC in place.  Consults: wound VAC/ostomy nursing  Significant Diagnostic Studies: none    Discharge Exam: Blood pressure 121/65, pulse 80, temperature 97.2 F (36.2 C), temperature source Oral, resp. rate 18, height 5\' 2"  (1.575 m), weight 54.148 kg (119 lb 6 oz), SpO2 97 %. Incisions both the midline and the ostomy site with VAC in place.  Raw muscle and tendon at the bottom of the wound  Disposition: 06-Home-Health Care Svc  Discharge Instructions    Diet - low sodium heart healthy    Complete by:  As directed      Discharge instructions    Complete by:  As directed   Wound VAC to open wounds.     Discharge wound care:    Complete by:  As directed   You will need the wound VAC while your muscle and tendons of the rectus abdominus muscles are exposed.  The granulation tissue over this raw exposed muscle will take time to form and close secondarily.     Increase activity slowly    Complete by:  As directed             Medication List    TAKE these medications        acetaminophen 500 MG tablet  Commonly known as:  TYLENOL  Take 2 tablets (1,000 mg total) by mouth every 6 (six) hours.     DULoxetine 60 MG capsule   Commonly known as:  CYMBALTA  Take 1 capsule (60 mg total) by mouth daily.     ferrous sulfate 325 (65 FE) MG tablet  You can buy this over the counter without a prescription.  Take one daily with supper after your bowel movements are more regular.  Start around 07/26/15.     oxyCODONE 5 MG immediate release tablet  Commonly known as:  Oxy IR/ROXICODONE  Take 1-2 tablets (5-10 mg total) by mouth every 4 (four) hours as needed for moderate pain, severe pain or breakthrough pain.     prenatal multivitamin Tabs tablet  Take 1 tablet by mouth daily.     saccharomyces boulardii 250 MG capsule  Commonly known as:  FLORASTOR  Take 1 capsule (250 mg total) by mouth 2 (two) times daily.     VITAMIN B 12 PO  Take 1 tablet by mouth daily.           Follow-up Information    Follow up with Johnathan Hausen B, MD In 3 weeks.   Specialty:  General Surgery   Contact information:   [address]  [place] [address] [phone]       Signed: Pedro Earls 10/28/2015, 10:30 AM

## 2016-02-03 ENCOUNTER — Other Ambulatory Visit: Payer: Self-pay | Admitting: Medical

## 2016-03-01 ENCOUNTER — Other Ambulatory Visit: Payer: Self-pay | Admitting: Medical

## 2016-03-02 NOTE — Telephone Encounter (Signed)
Left message for the patient that a 1 month supply of the Cymbalta was sent to the pharmacy and she would need to follow up in 1 month with E. Saguier.

## 2016-03-02 NOTE — Telephone Encounter (Signed)
I will fill duloxetine for one month. Ask pt to follow up in one month.

## 2016-03-03 NOTE — Telephone Encounter (Signed)
Reminder letter mailed to pt to call and schedule a 1 month follow up.

## 2016-03-15 ENCOUNTER — Encounter: Payer: BLUE CROSS/BLUE SHIELD | Admitting: Medical

## 2016-03-15 DIAGNOSIS — Z0289 Encounter for other administrative examinations: Secondary | ICD-10-CM

## 2016-03-15 NOTE — Progress Notes (Signed)
This encounter was created in error - please disregard.

## 2016-03-16 ENCOUNTER — Telehealth: Payer: Self-pay | Admitting: Medical

## 2016-03-16 NOTE — Telephone Encounter (Signed)
Patient was No Show on 6/26. Charge or No Charge?

## 2016-03-16 NOTE — Telephone Encounter (Signed)
charge 

## 2016-03-19 ENCOUNTER — Encounter: Payer: Self-pay | Admitting: Medical

## 2016-03-31 ENCOUNTER — Ambulatory Visit (INDEPENDENT_AMBULATORY_CARE_PROVIDER_SITE_OTHER): Payer: BLUE CROSS/BLUE SHIELD | Admitting: Medical

## 2016-03-31 ENCOUNTER — Encounter: Payer: Self-pay | Admitting: Medical

## 2016-03-31 VITALS — BP 100/70 | HR 77 | Temp 97.0°F | Ht 62.0 in | Wt 135.0 lb

## 2016-03-31 DIAGNOSIS — R1032 Left lower quadrant pain: Secondary | ICD-10-CM | POA: Diagnosis not present

## 2016-03-31 DIAGNOSIS — Z113 Encounter for screening for infections with a predominantly sexual mode of transmission: Secondary | ICD-10-CM | POA: Diagnosis not present

## 2016-03-31 DIAGNOSIS — D649 Anemia, unspecified: Secondary | ICD-10-CM | POA: Diagnosis not present

## 2016-03-31 LAB — CBC WITH DIFFERENTIAL/PLATELET
BASOS ABS: 0.1 10*3/uL (ref 0.0–0.1)
Basophils Relative: 0.9 % (ref 0.0–3.0)
Eosinophils Absolute: 0.1 10*3/uL (ref 0.0–0.7)
Eosinophils Relative: 0.9 % (ref 0.0–5.0)
HEMATOCRIT: 36.8 % (ref 36.0–46.0)
Hemoglobin: 12.6 g/dL (ref 12.0–15.0)
LYMPHS ABS: 2.7 10*3/uL (ref 0.7–4.0)
Lymphocytes Relative: 30.6 % (ref 12.0–46.0)
MCHC: 34.3 g/dL (ref 30.0–36.0)
MCV: 95.4 fl (ref 78.0–100.0)
MONO ABS: 0.7 10*3/uL (ref 0.1–1.0)
MONOS PCT: 8.4 % (ref 3.0–12.0)
NEUTROS ABS: 5.2 10*3/uL (ref 1.4–7.7)
NEUTROS PCT: 59.2 % (ref 43.0–77.0)
Platelets: 389 10*3/uL (ref 150.0–400.0)
RBC: 3.85 Mil/uL — ABNORMAL LOW (ref 3.87–5.11)
RDW: 13.9 % (ref 11.5–15.5)
WBC: 8.8 10*3/uL (ref 4.0–10.5)

## 2016-03-31 LAB — IRON AND TIBC
%SAT: 42 % (ref 11–50)
Iron: 120 ug/dL (ref 45–160)
TIBC: 287 ug/dL (ref 250–450)
UIBC: 167 ug/dL (ref 125–400)

## 2016-03-31 LAB — FERRITIN: FERRITIN: 87.2 ng/mL (ref 10.0–291.0)

## 2016-03-31 NOTE — Patient Instructions (Addendum)
For your anemia will get cbc and iron studies,  For routine std screening will get hiv(maintenance lab)  You may have adhesion type pain from prior surgery. I would watch this pain. See if becoming more constant or severe. If so would rx cipro and flagyl. Maybe consider ct abdomen and pelvis. Would follow through with your colonscopy.  Follow up in one month or so. Schedule for cpe early am and come in fasting for 8 hours.

## 2016-03-31 NOTE — Progress Notes (Signed)
Subjective:    Patient ID: [name], female    DOB: [birth year], 55 y.o.   MRN: [id number]  HPI  Pt in stating she is feeling better. She still better energy. No longer feeling fatigued.  Pt thinks while since she had hb checked. She is still on iron.  Pt states occasional random bruise but not severe.  Pt has history of diverticultis  With complications. She had closure of hartman pouch in feb. Since then energy better and gained some weight.  Pt in area of surgery has occasional sharp transient pain in area of surgery. But not constant. No fever, no chills, no sweats and no blood in stools.  Pt had colonsocpy about 6 years ago. She had above problems before she had a chance to repeat. Now they will schedule in about 3 months.     Review of Systems  Constitutional: Negative for fever, chills and fatigue.  Respiratory: Negative for cough, choking, shortness of breath and wheezing.   Cardiovascular: Negative for chest pain and palpitations.  Gastrointestinal: Negative for nausea, vomiting, abdominal pain, diarrhea and constipation.       See hpi.  Genitourinary: Negative for dysuria and flank pain.  Musculoskeletal: Negative for back pain.  Neurological: Negative for dizziness, speech difficulty and headaches.  Hematological: Negative for adenopathy. Does not bruise/bleed easily.  Psychiatric/Behavioral: Negative for behavioral problems and confusion.    Past Medical History  Diagnosis Date  . Depression     pt states when her husband passed away. now feels fine.  . Melanoma ([place]) more than 10 yrs ago    Chest   . Chronic back pain   . Diverticulitis   . Arthritis     oa left hip, sees dr Lyla Glassing     Social History   Social History  . Marital Status: Widowed    Spouse Name: N/A  . Number of Children: N/A  . Years of Education: N/A   Occupational History  . Not on file.   Social History Main Topics  . Smoking status: Current Every Day Smoker -- 1.00  packs/day for 30 years    Types: Cigarettes  . Smokeless tobacco: Never Used     Comment: Pt info given 05-30-2015  . Alcohol Use: 0.0 oz/week    0 Standard drinks or equivalent per week     Comment: 2 glasses of wine a day  . Drug Use: No  . Sexual Activity: No   Other Topics Concern  . Not on file   Social History Narrative    Past Surgical History  Procedure Laterality Date  . Melanoma excision    . Flexible sigmoidoscopy N/A 06/05/2015    Procedure: FLEXIBLE SIGMOIDOSCOPY;  Surgeon: Manus Gunning, MD;  Location: [facility];  Service: Gastroenterology;  Laterality: N/A;  . Partial colectomy N/A 07/15/2015    Procedure: LAPAROSCOPIC ASSISTED SIGMOID COLECTOMY WITH COLOSTOMY, HARTMANN PROCEDURE, WITH PLACEMENT OF WOUND VAC;  Surgeon: Johnathan Hausen, MD;  Location: [facility];  Service: General;  Laterality: N/A;  . Back surgery   20 yrs ago    lower  . Tonsillectomy  as child  . Abdominal hysterectomy  1989    Ovaries in place  . Colostomy takedown N/A 10/22/2015    Procedure: LAPAROSCOPY WITH INTEROLYSIS, CLOSURE OF HARTMANS POUCH, REMOVAL OF LEFT OVARIAN CYST;  Surgeon: Johnathan Hausen, MD;  Location: [facility];  Service: General;  Laterality: N/A;    Family History  Problem Relation Age of Onset  . Hypertension Father   .  Cancer Maternal Aunt     breast  . Cancer Maternal Uncle     leukemia  . Cancer Maternal Grandmother 40    breast  . Heart disease Neg Hx   . Stroke Neg Hx   . Cancer Maternal Uncle     colon    Allergies  Allergen Reactions  . Ampicillin Nausea And Vomiting    Has patient had a PCN reaction causing immediate rash, facial/tongue/throat swelling, SOB or lightheadedness with hypotension: No Has patient had a PCN reaction causing severe rash involving mucus membranes or skin necrosis: No Has patient had a PCN reaction that required hospitalization No Has patient had a PCN reaction occurring within the last 10 years: Yes If all of the above  answers are "NO", then may proceed with Cephalosporin use.    Current Outpatient Prescriptions on File Prior to Visit  Medication Sig Dispense Refill  . acetaminophen (TYLENOL) 500 MG tablet Take 2 tablets (1,000 mg total) by mouth every 6 (six) hours. (Patient taking differently: Take 1,000 mg by mouth every 6 (six) hours as needed (Pain). ) 30 tablet 0  . Cyanocobalamin (VITAMIN B 12 PO) Take 1 tablet by mouth daily.     . DULoxetine (CYMBALTA) 60 MG capsule TAKE 1 CAPSULE(60 MG) BY MOUTH DAILY 30 capsule 0  . ferrous sulfate 325 (65 FE) MG tablet You can buy this over the counter without a prescription.  Take one daily with supper after your bowel movements are more regular.  Start around 07/26/15. (Patient taking differently: Take 325 mg by mouth daily. )  3  . Prenatal Vit-Fe Fumarate-FA (PRENATAL MULTIVITAMIN) TABS tablet Take 1 tablet by mouth daily.     Marland Kitchen saccharomyces boulardii (FLORASTOR) 250 MG capsule Take 1 capsule (250 mg total) by mouth 2 (two) times daily. 60 capsule 0   No current facility-administered medications on file prior to visit.    BP 100/70 mmHg  Pulse 77  Temp(Src) 97 F (36.1 C) (Oral)  Ht 5\' 2"  (1.575 m)  Wt 135 lb (61.236 kg)  BMI 24.69 kg/m2  SpO2 98%       Objective:   Physical Exam  General Appearance- Not in acute distress.  HEENT Eyes- Scleraeral/Conjuntiva-bilat- Not Yellow. Mouth & Throat- Normal.  Chest and Lung Exam Auscultation: Breath sounds:-Normal. Adventitious sounds:- No Adventitious sounds.  Cardiovascular Auscultation:Rythm - Regular. Heart Sounds -Normal heart sounds.  Abdomen Inspection:-Inspection Normal.  Palpation/Perucssion: Palpation and Percussion of the abdomen reveal- Non Tender, No Rebound tenderness, No rigidity(Guarding) and No Palpable abdominal masses.  Liver:-Normal.  Spleen:- Normal.   Back- no cva tenderness.      Assessment & Plan:  For your anemia will get cbc and iron studies,  For routine std  screening will get hiv(maintenance lab)  You may have adhesion type pain from prior surgery. I would watch this pain. See if becoming more constant or severe. If so would rx cipro and flagyl. Maybe consider ct abdomen and pelvis. Would follow through with your colonscopy.  Follow up in one month or so. Schedule for cpe early am and come in fasting for 8 hours.  Shia Delaine, Percell Miller, PA-C

## 2016-03-31 NOTE — Progress Notes (Signed)
Pre visit review using our clinic review tool, if applicable. No additional management support is needed unless otherwise documented below in the visit note. 

## 2016-04-01 LAB — HIV ANTIBODY (ROUTINE TESTING W REFLEX): HIV 1&2 Ab, 4th Generation: NONREACTIVE

## 2016-04-01 NOTE — Progress Notes (Signed)
Quick Note:  Pt has seen results on MyChart and message also sent for patient to call back if any questions. ______ 

## 2016-04-02 ENCOUNTER — Other Ambulatory Visit: Payer: Self-pay | Admitting: Medical

## 2016-04-07 ENCOUNTER — Encounter: Payer: Self-pay | Admitting: Medical

## 2016-04-07 ENCOUNTER — Ambulatory Visit (INDEPENDENT_AMBULATORY_CARE_PROVIDER_SITE_OTHER): Payer: BLUE CROSS/BLUE SHIELD | Admitting: Medical

## 2016-04-07 VITALS — BP 102/76 | HR 88 | Temp 98.1°F | Ht 62.0 in | Wt 132.8 lb

## 2016-04-07 DIAGNOSIS — Z720 Tobacco use: Secondary | ICD-10-CM | POA: Diagnosis not present

## 2016-04-07 DIAGNOSIS — Z0001 Encounter for general adult medical examination with abnormal findings: Secondary | ICD-10-CM

## 2016-04-07 DIAGNOSIS — R82998 Other abnormal findings in urine: Secondary | ICD-10-CM

## 2016-04-07 DIAGNOSIS — Z1283 Encounter for screening for malignant neoplasm of skin: Secondary | ICD-10-CM

## 2016-04-07 DIAGNOSIS — Z0000 Encounter for general adult medical examination without abnormal findings: Secondary | ICD-10-CM

## 2016-04-07 DIAGNOSIS — Z87891 Personal history of nicotine dependence: Secondary | ICD-10-CM

## 2016-04-07 DIAGNOSIS — R8299 Other abnormal findings in urine: Secondary | ICD-10-CM | POA: Diagnosis not present

## 2016-04-07 LAB — POC URINALSYSI DIPSTICK (AUTOMATED)
Bilirubin, UA: NEGATIVE
Blood, UA: NEGATIVE
GLUCOSE UA: NEGATIVE
Ketones, UA: NEGATIVE
LEUKOCYTES UA: NEGATIVE
NITRITE UA: POSITIVE
PROTEIN UA: NEGATIVE
Spec Grav, UA: >=1.03
UROBILINOGEN UA: 0.2
pH, UA: 6

## 2016-04-07 LAB — LIPID PANEL
CHOL/HDL RATIO: 3
CHOLESTEROL: 227 mg/dL — AB (ref 0–200)
HDL: 66 mg/dL (ref >39.00)
NonHDL: 161.42
TRIGLYCERIDES: 262 mg/dL — AB (ref 0.0–149.0)
VLDL: 52.4 mg/dL — ABNORMAL HIGH (ref 0.0–40.0)

## 2016-04-07 LAB — COMPREHENSIVE METABOLIC PANEL
ALK PHOS: 71 U/L (ref 39–117)
ALT: 14 U/L (ref 0–35)
AST: 18 U/L (ref 0–37)
Albumin: 4.4 g/dL (ref 3.5–5.2)
BILIRUBIN TOTAL: 0.6 mg/dL (ref 0.2–1.2)
BUN: 13 mg/dL (ref 6–23)
CALCIUM: 9.5 mg/dL (ref 8.4–10.5)
CO2: 30 mEq/L (ref 19–32)
Chloride: 100 mEq/L (ref 96–112)
Creatinine, Ser: 0.62 mg/dL (ref 0.40–1.20)
GFR: 105.41 mL/min (ref >60.00)
Glucose, Bld: 85 mg/dL (ref 70–99)
POTASSIUM: 4.1 meq/L (ref 3.5–5.1)
Sodium: 136 mEq/L (ref 135–145)
TOTAL PROTEIN: 7.1 g/dL (ref 6.0–8.3)

## 2016-04-07 LAB — LDL CHOLESTEROL, DIRECT: LDL DIRECT: 127 mg/dL

## 2016-04-07 LAB — TSH: TSH: 1.86 u[IU]/mL (ref 0.35–4.50)

## 2016-04-07 NOTE — Patient Instructions (Addendum)
For your wellness exam will get cmp, tsh, lipid panel and UA.  If you want hep c screening please contact insurance first to see if covered.  If you get motivated to stop smoking notify me and could rx wellbutrin.  Will go ahead and refer to dermatologist for skin surveillance exam and check lesion on stomach.  For you area of very rare transient neck pain notify me if this area get more frequent or any palpable abnormality. If so then will get [place] of area.  Follow up date to be determined after lab review.  Please schedule your colonoscopy.  Preventive Care for Adults, Female A healthy lifestyle and preventive care can promote health and wellness. Preventive health guidelines for women include the following key practices.  A routine yearly physical is a good way to check with your health care provider about your health and preventive screening. It is a chance to share any concerns and updates on your health and to receive a thorough exam.  Visit your dentist for a routine exam and preventive care every 6 months. Brush your teeth twice a day and floss once a day. Good oral hygiene prevents tooth decay and gum disease.  The frequency of eye exams is based on your age, health, family medical history, use of contact lenses, and other factors. Follow your health care provider's recommendations for frequency of eye exams.  Eat a healthy diet. Foods like vegetables, fruits, whole grains, low-fat dairy products, and lean protein foods contain the nutrients you need without too many calories. Decrease your intake of foods high in solid fats, added sugars, and salt. Eat the right amount of calories for you.Get information about a proper diet from your health care provider, if necessary.  Regular physical exercise is one of the most important things you can do for your health. Most adults should get at least 150 minutes of moderate-intensity exercise (any activity that increases your heart rate and  causes you to sweat) each week. In addition, most adults need muscle-strengthening exercises on 2 or more days a week.  Maintain a healthy weight. The body mass index (BMI) is a screening tool to identify possible weight problems. It provides an estimate of body fat based on height and weight. Your health care provider can find your BMI and can help you achieve or maintain a healthy weight.For adults 20 years and older:  A BMI below 18.5 is considered underweight.  A BMI of 18.5 to 24.9 is normal.  A BMI of 25 to 29.9 is considered overweight.  A BMI of 30 and above is considered obese.  Maintain normal blood lipids and cholesterol levels by exercising and minimizing your intake of saturated fat. Eat a balanced diet with plenty of fruit and vegetables. Blood tests for lipids and cholesterol should begin at age 79 and be repeated every 5 years. If your lipid or cholesterol levels are high, you are over 50, or you are at high risk for heart disease, you may need your cholesterol levels checked more frequently.Ongoing high lipid and cholesterol levels should be treated with medicines if diet and exercise are not working.  If you smoke, find out from your health care provider how to quit. If you do not use tobacco, do not start.  Lung cancer screening is recommended for adults aged 58-80 years who are at high risk for developing lung cancer because of a history of smoking. A yearly low-dose CT scan of the lungs is recommended for people who have  at least a 30-pack-year history of smoking and are a current smoker or have quit within the past 15 years. A pack year of smoking is smoking an average of 1 pack of cigarettes a day for 1 year (for example: 1 pack a day for 30 years or 2 packs a day for 15 years). Yearly screening should continue until the smoker has stopped smoking for at least 15 years. Yearly screening should be stopped for people who develop a health problem that would prevent them from  having lung cancer treatment.  If you are pregnant, do not drink alcohol. If you are breastfeeding, be very cautious about drinking alcohol. If you are not pregnant and choose to drink alcohol, do not have more than 1 drink per day. One drink is considered to be 12 ounces (355 mL) of beer, 5 ounces (148 mL) of wine, or 1.5 ounces (44 mL) of liquor.  Avoid use of street drugs. Do not share needles with anyone. Ask for help if you need support or instructions about stopping the use of drugs.  High blood pressure causes heart disease and increases the risk of stroke. Your blood pressure should be checked at least every 1 to 2 years. Ongoing high blood pressure should be treated with medicines if weight loss and exercise do not work.  If you are 28-50 years old, ask your health care provider if you should take aspirin to prevent strokes.  Diabetes screening is done by taking a blood sample to check your blood glucose level after you have not eaten for a certain period of time (fasting). If you are not overweight and you do not have risk factors for diabetes, you should be screened once every 3 years starting at age 72. If you are overweight or obese and you are 2-30 years of age, you should be screened for diabetes every year as part of your cardiovascular risk assessment.  Breast cancer screening is essential preventive care for women. You should practice "breast self-awareness." This means understanding the normal appearance and feel of your breasts and may include breast self-examination. Any changes detected, no matter how small, should be reported to a health care provider. Women in their 90s and 30s should have a clinical breast exam (CBE) by a health care provider as part of a regular health exam every 1 to 3 years. After age 37, women should have a CBE every year. Starting at age 2, women should consider having a mammogram (breast X-ray test) every year. Women who have a family history of breast  cancer should talk to their health care provider about genetic screening. Women at a high risk of breast cancer should talk to their health care providers about having an MRI and a mammogram every year.  Breast cancer gene (BRCA)-related cancer risk assessment is recommended for women who have family members with BRCA-related cancers. BRCA-related cancers include breast, ovarian, tubal, and peritoneal cancers. Having family members with these cancers may be associated with an increased risk for harmful changes (mutations) in the breast cancer genes BRCA1 and BRCA2. Results of the assessment will determine the need for genetic counseling and BRCA1 and BRCA2 testing.  Your health care provider may recommend that you be screened regularly for cancer of the pelvic organs (ovaries, uterus, and vagina). This screening involves a pelvic examination, including checking for microscopic changes to the surface of your cervix (Pap test). You may be encouraged to have this screening done every 3 years, beginning at age 56.  For women ages 33-65, health care providers may recommend pelvic exams and Pap testing every 3 years, or they may recommend the Pap and pelvic exam, combined with testing for human papilloma virus (HPV), every 5 years. Some types of HPV increase your risk of cervical cancer. Testing for HPV may also be done on women of any age with unclear Pap test results.  Other health care providers may not recommend any screening for nonpregnant women who are considered low risk for pelvic cancer and who do not have symptoms. Ask your health care provider if a screening pelvic exam is right for you.  If you have had past treatment for cervical cancer or a condition that could lead to cancer, you need Pap tests and screening for cancer for at least 20 years after your treatment. If Pap tests have been discontinued, your risk factors (such as having a new sexual partner) need to be reassessed to determine if  screening should resume. Some women have medical problems that increase the chance of getting cervical cancer. In these cases, your health care provider may recommend more frequent screening and Pap tests.  Colorectal cancer can be detected and often prevented. Most routine colorectal cancer screening begins at the age of 3 years and continues through age 73 years. However, your health care provider may recommend screening at an earlier age if you have risk factors for colon cancer. On a yearly basis, your health care provider may provide home test kits to check for hidden blood in the stool. Use of a small camera at the end of a tube, to directly examine the colon (sigmoidoscopy or colonoscopy), can detect the earliest forms of colorectal cancer. Talk to your health care provider about this at age 62, when routine screening begins. Direct exam of the colon should be repeated every 5-10 years through age 48 years, unless early forms of precancerous polyps or small growths are found.  People who are at an increased risk for hepatitis B should be screened for this virus. You are considered at high risk for hepatitis B if:  You were born in a country where hepatitis B occurs often. Talk with your health care provider about which countries are considered high risk.  Your parents were born in a high-risk country and you have not received a shot to protect against hepatitis B (hepatitis B vaccine).  You have HIV or AIDS.  You use needles to inject street drugs.  You live with, or have sex with, someone who has hepatitis B.  You get hemodialysis treatment.  You take certain medicines for conditions like cancer, organ transplantation, and autoimmune conditions.  Hepatitis C blood testing is recommended for all people born from [birth year] through 1967 and any individual with known risks for hepatitis C.  Practice safe sex. Use condoms and avoid high-risk sexual practices to reduce the spread of sexually  transmitted infections (STIs). STIs include gonorrhea, chlamydia, syphilis, trichomonas, herpes, HPV, and human immunodeficiency virus (HIV). Herpes, HIV, and HPV are viral illnesses that have no cure. They can result in disability, cancer, and death.  You should be screened for sexually transmitted illnesses (STIs) including gonorrhea and chlamydia if:  You are sexually active and are younger than 24 years.  You are older than 24 years and your health care provider tells you that you are at risk for this type of infection.  Your sexual activity has changed since you were last screened and you are at an increased risk for chlamydia or  gonorrhea. Ask your health care provider if you are at risk.  If you are at risk of being infected with HIV, it is recommended that you take a prescription medicine daily to prevent HIV infection. This is called preexposure prophylaxis (PrEP). You are considered at risk if:  You are sexually active and do not regularly use condoms or know the HIV status of your partner(s).  You take drugs by injection.  You are sexually active with a partner who has HIV.  Talk with your health care provider about whether you are at high risk of being infected with HIV. If you choose to begin PrEP, you should first be tested for HIV. You should then be tested every 3 months for as long as you are taking PrEP.  Osteoporosis is a disease in which the bones lose minerals and strength with aging. This can result in serious bone fractures or breaks. The risk of osteoporosis can be identified using a bone density scan. Women ages 10 years and over and women at risk for fractures or osteoporosis should discuss screening with their health care providers. Ask your health care provider whether you should take a calcium supplement or vitamin D to reduce the rate of osteoporosis.  Menopause can be associated with physical symptoms and risks. Hormone replacement therapy is available to decrease  symptoms and risks. You should talk to your health care provider about whether hormone replacement therapy is right for you.  Use sunscreen. Apply sunscreen liberally and repeatedly throughout the day. You should seek shade when your shadow is shorter than you. Protect yourself by wearing long sleeves, pants, a wide-brimmed hat, and sunglasses year round, whenever you are outdoors.  Once a month, do a whole body skin exam, using a mirror to look at the skin on your back. Tell your health care provider of new moles, moles that have irregular borders, moles that are larger than a pencil eraser, or moles that have changed in shape or color.  Stay current with required vaccines (immunizations).  Influenza vaccine. All adults should be immunized every year.  Tetanus, diphtheria, and acellular pertussis (Td, Tdap) vaccine. Pregnant women should receive 1 dose of Tdap vaccine during each pregnancy. The dose should be obtained regardless of the length of time since the last dose. Immunization is preferred during the 27th-36th week of gestation. An adult who has not previously received Tdap or who does not know her vaccine status should receive 1 dose of Tdap. This initial dose should be followed by tetanus and diphtheria toxoids (Td) booster doses every 10 years. Adults with an unknown or incomplete history of completing a 3-dose immunization series with Td-containing vaccines should begin or complete a primary immunization series including a Tdap dose. Adults should receive a Td booster every 10 years.  Varicella vaccine. An adult without evidence of immunity to varicella should receive 2 doses or a second dose if she has previously received 1 dose. Pregnant females who do not have evidence of immunity should receive the first dose after pregnancy. This first dose should be obtained before leaving the health care facility. The second dose should be obtained 4-8 weeks after the first dose.  Human  papillomavirus (HPV) vaccine. Females aged 13-26 years who have not received the vaccine previously should obtain the 3-dose series. The vaccine is not recommended for use in pregnant females. However, pregnancy testing is not needed before receiving a dose. If a female is found to be pregnant after receiving a dose, no treatment  is needed. In that case, the remaining doses should be delayed until after the pregnancy. Immunization is recommended for any person with an immunocompromised condition through the age of 10 years if she did not get any or all doses earlier. During the 3-dose series, the second dose should be obtained 4-8 weeks after the first dose. The third dose should be obtained 24 weeks after the first dose and 16 weeks after the second dose.  Zoster vaccine. One dose is recommended for adults aged 80 years or older unless certain conditions are present.  Measles, mumps, and rubella (MMR) vaccine. Adults born before [birth year] generally are considered immune to measles and mumps. Adults born in [birth year] or later should have 1 or more doses of MMR vaccine unless there is a contraindication to the vaccine or there is laboratory evidence of immunity to each of the three diseases. A routine second dose of MMR vaccine should be obtained at least 28 days after the first dose for students attending postsecondary schools, health care workers, or international travelers. People who received inactivated measles vaccine or an unknown type of measles vaccine during 1963-1967 should receive 2 doses of MMR vaccine. People who received inactivated mumps vaccine or an unknown type of mumps vaccine before 1979 and are at high risk for mumps infection should consider immunization with 2 doses of MMR vaccine. For females of childbearing age, rubella immunity should be determined. If there is no evidence of immunity, females who are not pregnant should be vaccinated. If there is no evidence of immunity, females who are pregnant  should delay immunization until after pregnancy. Unvaccinated health care workers born before [birth year] who lack laboratory evidence of measles, mumps, or rubella immunity or laboratory confirmation of disease should consider measles and mumps immunization with 2 doses of MMR vaccine or rubella immunization with 1 dose of MMR vaccine.  Pneumococcal 13-valent conjugate (PCV13) vaccine. When indicated, a person who is uncertain of his immunization history and has no record of immunization should receive the PCV13 vaccine. All adults 16 years of age and older should receive this vaccine. An adult aged 62 years or older who has certain medical conditions and has not been previously immunized should receive 1 dose of PCV13 vaccine. This PCV13 should be followed with a dose of pneumococcal polysaccharide (PPSV23) vaccine. Adults who are at high risk for pneumococcal disease should obtain the PPSV23 vaccine at least 8 weeks after the dose of PCV13 vaccine. Adults older than 57 years of age who have normal immune system function should obtain the PPSV23 vaccine dose at least 1 year after the dose of PCV13 vaccine.  Pneumococcal polysaccharide (PPSV23) vaccine. When PCV13 is also indicated, PCV13 should be obtained first. All adults aged 77 years and older should be immunized. An adult younger than age 62 years who has certain medical conditions should be immunized. Any person who resides in a nursing home or long-term care facility should be immunized. An adult smoker should be immunized. People with an immunocompromised condition and certain other conditions should receive both PCV13 and PPSV23 vaccines. People with human immunodeficiency virus (HIV) infection should be immunized as soon as possible after diagnosis. Immunization during chemotherapy or radiation therapy should be avoided. Routine use of PPSV23 vaccine is not recommended for American Indians, [place] Natives, or people younger than 65 years unless there are  medical conditions that require PPSV23 vaccine. When indicated, people who have unknown immunization and have no record of immunization should receive PPSV23 vaccine. One-time revaccination  5 years after the first dose of PPSV23 is recommended for people aged 19-64 years who have chronic kidney failure, nephrotic syndrome, asplenia, or immunocompromised conditions. People who received 1-2 doses of PPSV23 before age 34 years should receive another dose of PPSV23 vaccine at age 61 years or later if at least 5 years have passed since the previous dose. Doses of PPSV23 are not needed for people immunized with PPSV23 at or after age 65 years.  Meningococcal vaccine. Adults with asplenia or persistent complement component deficiencies should receive 2 doses of quadrivalent meningococcal conjugate (MenACWY-D) vaccine. The doses should be obtained at least 2 months apart. Microbiologists working with certain meningococcal bacteria, [place] recruits, people at risk during an outbreak, and people who travel to or live in countries with a high rate of meningitis should be immunized. A first-year college student up through age 35 years who is living in a residence hall should receive a dose if she did not receive a dose on or after her 16th birthday. Adults who have certain high-risk conditions should receive one or more doses of vaccine.  Hepatitis A vaccine. Adults who wish to be protected from this disease, have certain high-risk conditions, work with hepatitis A-infected animals, work in hepatitis A research labs, or travel to or work in countries with a high rate of hepatitis A should be immunized. Adults who were previously unvaccinated and who anticipate close contact with an international adoptee during the first 60 days after arrival in the [place] from a country with a high rate of hepatitis A should be immunized.  Hepatitis B vaccine. Adults who wish to be protected from this disease, have certain  high-risk conditions, may be exposed to blood or other infectious body fluids, are household contacts or sex partners of hepatitis B positive people, are clients or workers in certain care facilities, or travel to or work in countries with a high rate of hepatitis B should be immunized.  Haemophilus influenzae type b (Hib) vaccine. A previously unvaccinated person with asplenia or sickle cell disease or having a scheduled splenectomy should receive 1 dose of Hib vaccine. Regardless of previous immunization, a recipient of a hematopoietic stem cell transplant should receive a 3-dose series 6-12 months after her successful transplant. Hib vaccine is not recommended for adults with HIV infection. Preventive Services / Frequency Ages 35 to 55 years  Blood pressure check.** / Every 3-5 years.  Lipid and cholesterol check.** / Every 5 years beginning at age 37.  Clinical breast exam.** / Every 3 years for women in their 34s and 49s.  BRCA-related cancer risk assessment.** / For women who have family members with a BRCA-related cancer (breast, ovarian, tubal, or peritoneal cancers).  Pap test.** / Every 2 years from ages 42 through 40. Every 3 years starting at age 45 through age 74 or 16 with a history of 3 consecutive normal Pap tests.  HPV screening.** / Every 3 years from ages 37 through ages 9 to 7 with a history of 3 consecutive normal Pap tests.  Hepatitis C blood test.** / For any individual with known risks for hepatitis C.  Skin self-exam. / Monthly.  Influenza vaccine. / Every year.  Tetanus, diphtheria, and acellular pertussis (Tdap, Td) vaccine.** / Consult your health care provider. Pregnant women should receive 1 dose of Tdap vaccine during each pregnancy. 1 dose of Td every 10 years.  Varicella vaccine.** / Consult your health care provider. Pregnant females who do not have evidence of immunity should  receive the first dose after pregnancy.  HPV vaccine. / 3 doses over 6  months, if 46 and younger. The vaccine is not recommended for use in pregnant females. However, pregnancy testing is not needed before receiving a dose.  Measles, mumps, rubella (MMR) vaccine.** / You need at least 1 dose of MMR if you were born in 1957 or later. You may also need a 2nd dose. For females of childbearing age, rubella immunity should be determined. If there is no evidence of immunity, females who are not pregnant should be vaccinated. If there is no evidence of immunity, females who are pregnant should delay immunization until after pregnancy.  Pneumococcal 13-valent conjugate (PCV13) vaccine.** / Consult your health care provider.  Pneumococcal polysaccharide (PPSV23) vaccine.** / 1 to 2 doses if you smoke cigarettes or if you have certain conditions.  Meningococcal vaccine.** / 1 dose if you are age 62 to 38 years and a Market researcher living in a residence hall, or have one of several medical conditions, you need to get vaccinated against meningococcal disease. You may also need additional booster doses.  Hepatitis A vaccine.** / Consult your health care provider.  Hepatitis B vaccine.** / Consult your health care provider.  Haemophilus influenzae type b (Hib) vaccine.** / Consult your health care provider. Ages 37 to 48 years  Blood pressure check.** / Every year.  Lipid and cholesterol check.** / Every 5 years beginning at age 68 years.  Lung cancer screening. / Every year if you are aged 73-80 years and have a 30-pack-year history of smoking and currently smoke or have quit within the past 15 years. Yearly screening is stopped once you have quit smoking for at least 15 years or develop a health problem that would prevent you from having lung cancer treatment.  Clinical breast exam.** / Every year after age 49 years.  BRCA-related cancer risk assessment.** / For women who have family members with a BRCA-related cancer (breast, ovarian, tubal, or peritoneal  cancers).  Mammogram.** / Every year beginning at age 51 years and continuing for as long as you are in good health. Consult with your health care provider.  Pap test.** / Every 3 years starting at age 62 years through age 71 or 25 years with a history of 3 consecutive normal Pap tests.  HPV screening.** / Every 3 years from ages 27 years through ages 21 to 110 years with a history of 3 consecutive normal Pap tests.  Fecal occult blood test (FOBT) of stool. / Every year beginning at age 67 years and continuing until age 52 years. You may not need to do this test if you get a colonoscopy every 10 years.  Flexible sigmoidoscopy or colonoscopy.** / Every 5 years for a flexible sigmoidoscopy or every 10 years for a colonoscopy beginning at age 91 years and continuing until age 89 years.  Hepatitis C blood test.** / For all people born from [birth year] through 1967 and any individual with known risks for hepatitis C.  Skin self-exam. / Monthly.  Influenza vaccine. / Every year.  Tetanus, diphtheria, and acellular pertussis (Tdap/Td) vaccine.** / Consult your health care provider. Pregnant women should receive 1 dose of Tdap vaccine during each pregnancy. 1 dose of Td every 10 years.  Varicella vaccine.** / Consult your health care provider. Pregnant females who do not have evidence of immunity should receive the first dose after pregnancy.  Zoster vaccine.** / 1 dose for adults aged 2 years or older.  Measles, mumps, rubella (  MMR) vaccine.** / You need at least 1 dose of MMR if you were born in 1957 or later. You may also need a second dose. For females of childbearing age, rubella immunity should be determined. If there is no evidence of immunity, females who are not pregnant should be vaccinated. If there is no evidence of immunity, females who are pregnant should delay immunization until after pregnancy.  Pneumococcal 13-valent conjugate (PCV13) vaccine.** / Consult your health care  provider.  Pneumococcal polysaccharide (PPSV23) vaccine.** / 1 to 2 doses if you smoke cigarettes or if you have certain conditions.  Meningococcal vaccine.** / Consult your health care provider.  Hepatitis A vaccine.** / Consult your health care provider.  Hepatitis B vaccine.** / Consult your health care provider.  Haemophilus influenzae type b (Hib) vaccine.** / Consult your health care provider. Ages 60 years and over  Blood pressure check.** / Every year.  Lipid and cholesterol check.** / Every 5 years beginning at age 18 years.  Lung cancer screening. / Every year if you are aged 50-80 years and have a 30-pack-year history of smoking and currently smoke or have quit within the past 15 years. Yearly screening is stopped once you have quit smoking for at least 15 years or develop a health problem that would prevent you from having lung cancer treatment.  Clinical breast exam.** / Every year after age 27 years.  BRCA-related cancer risk assessment.** / For women who have family members with a BRCA-related cancer (breast, ovarian, tubal, or peritoneal cancers).  Mammogram.** / Every year beginning at age 64 years and continuing for as long as you are in good health. Consult with your health care provider.  Pap test.** / Every 3 years starting at age 52 years through age 7 or 86 years with 3 consecutive normal Pap tests. Testing can be stopped between 65 and 70 years with 3 consecutive normal Pap tests and no abnormal Pap or HPV tests in the past 10 years.  HPV screening.** / Every 3 years from ages 5 years through ages 51 or 35 years with a history of 3 consecutive normal Pap tests. Testing can be stopped between 65 and 70 years with 3 consecutive normal Pap tests and no abnormal Pap or HPV tests in the past 10 years.  Fecal occult blood test (FOBT) of stool. / Every year beginning at age 2 years and continuing until age 13 years. You may not need to do this test if you get a  colonoscopy every 10 years.  Flexible sigmoidoscopy or colonoscopy.** / Every 5 years for a flexible sigmoidoscopy or every 10 years for a colonoscopy beginning at age 39 years and continuing until age 17 years.  Hepatitis C blood test.** / For all people born from [birth year] through 1967 and any individual with known risks for hepatitis C.  Osteoporosis screening.** / A one-time screening for women ages 80 years and over and women at risk for fractures or osteoporosis.  Skin self-exam. / Monthly.  Influenza vaccine. / Every year.  Tetanus, diphtheria, and acellular pertussis (Tdap/Td) vaccine.** / 1 dose of Td every 10 years.  Varicella vaccine.** / Consult your health care provider.  Zoster vaccine.** / 1 dose for adults aged 41 years or older.  Pneumococcal 13-valent conjugate (PCV13) vaccine.** / Consult your health care provider.  Pneumococcal polysaccharide (PPSV23) vaccine.** / 1 dose for all adults aged 65 years and older.  Meningococcal vaccine.** / Consult your health care provider.  Hepatitis A vaccine.** / Consult your  health care provider.  Hepatitis B vaccine.** / Consult your health care provider.  Haemophilus influenzae type b (Hib) vaccine.** / Consult your health care provider. ** Family history and personal history of risk and conditions may change your health care provider's recommendations.   This information is not intended to replace advice given to you by your health care provider. Make sure you discuss any questions you have with your health care provider.   Document Released: 11/02/2001 Document Revised: 09/27/2014 Document Reviewed: 02/01/2011 Elsevier Interactive Patient Education [employer].

## 2016-04-07 NOTE — Progress Notes (Signed)
Subjective:    Patient ID: [name], female    DOB: Jan 13, 1961, 55 y.o.   MRN: [id number]  HPI   I have reviewed pt PMH, PSH, FH, Social History and Surgical History  Pt is walking almost every day. She walks her dog. Sometimes has to carry her dog. Pt is eating healthy fruits and vegetables. Pt has coffee in am usually. Tea occasionally in afternoon. About one soda a week. Pt does smoke 1 ppd. Smoked for about 30 years. Pt has never quit. Never had desire to quit.  Pt has plans to have colonoscopy she needs to schedule this.   Pt is up to date on tetanus and mammogram.  Pt has had hysterectomy.  Pt never had hep c screening.(no hx of iv drug use. And does not report promiscuous behavior). Pt appears low risk but if she wants to make sure screening test covered advised ask her insurance..   Pt reports occasional rare transient neck pain for about a minute and half and appears randomly.  Will occur very randomly. Does not occur with movement.       Review of Systems  Constitutional: Negative for fever, chills, diaphoresis, activity change and fatigue.  HENT: Negative for dental problem, facial swelling, mouth sores, postnasal drip, sinus pressure, sore throat and trouble swallowing.   Respiratory: Negative for cough, chest tightness and shortness of breath.   Cardiovascular: Negative for chest pain, palpitations and leg swelling.  Gastrointestinal: Negative for nausea, vomiting and abdominal pain.  Musculoskeletal: Negative for neck pain and neck stiffness.  Skin: Negative for rash.  Neurological: Negative for dizziness, facial asymmetry, speech difficulty, weakness and numbness.  Psychiatric/Behavioral: Negative for behavioral problems, confusion and agitation. The patient is not nervous/anxious.      Past Medical History  Diagnosis Date  . Depression     pt states when her husband passed away. now feels fine.  . Melanoma ([place]) more than 10 yrs ago    Chest   .  Chronic back pain   . Diverticulitis   . Arthritis     oa left hip, sees dr Lyla Glassing     Social History   Social History  . Marital Status: Widowed    Spouse Name: N/A  . Number of Children: N/A  . Years of Education: N/A   Occupational History  . Not on file.   Social History Main Topics  . Smoking status: Current Every Day Smoker -- 1.00 packs/day for 30 years    Types: Cigarettes  . Smokeless tobacco: Never Used     Comment: Pt info given 05-30-2015  . Alcohol Use: 0.0 oz/week    0 Standard drinks or equivalent per week     Comment: 2 glasses of wine a day  . Drug Use: No  . Sexual Activity: No   Other Topics Concern  . Not on file   Social History Narrative    Past Surgical History  Procedure Laterality Date  . Melanoma excision    . Flexible sigmoidoscopy N/A 06/05/2015    Procedure: FLEXIBLE SIGMOIDOSCOPY;  Surgeon: Manus Gunning, MD;  Location: [facility];  Service: Gastroenterology;  Laterality: N/A;  . Partial colectomy N/A 07/15/2015    Procedure: LAPAROSCOPIC ASSISTED SIGMOID COLECTOMY WITH COLOSTOMY, HARTMANN PROCEDURE, WITH PLACEMENT OF WOUND VAC;  Surgeon: Johnathan Hausen, MD;  Location: [facility];  Service: General;  Laterality: N/A;  . Back surgery   20 yrs ago    lower  . Tonsillectomy  as child  .  Abdominal hysterectomy  1989    Ovaries in place  . Colostomy takedown N/A 10/22/2015    Procedure: LAPAROSCOPY WITH INTEROLYSIS, CLOSURE OF HARTMANS POUCH, REMOVAL OF LEFT OVARIAN CYST;  Surgeon: Johnathan Hausen, MD;  Location: [facility];  Service: General;  Laterality: N/A;    Family History  Problem Relation Age of Onset  . Hypertension Father   . Cancer Maternal Aunt     breast  . Cancer Maternal Uncle     leukemia  . Cancer Maternal Grandmother 40    breast  . Heart disease Neg Hx   . Stroke Neg Hx   . Cancer Maternal Uncle     colon    Allergies  Allergen Reactions  . Ampicillin Nausea And Vomiting    Has patient had a PCN reaction  causing immediate rash, facial/tongue/throat swelling, SOB or lightheadedness with hypotension: No Has patient had a PCN reaction causing severe rash involving mucus membranes or skin necrosis: No Has patient had a PCN reaction that required hospitalization No Has patient had a PCN reaction occurring within the last 10 years: Yes If all of the above answers are "NO", then may proceed with Cephalosporin use.    Current Outpatient Prescriptions on File Prior to Visit  Medication Sig Dispense Refill  . acetaminophen (TYLENOL) 500 MG tablet Take 2 tablets (1,000 mg total) by mouth every 6 (six) hours. (Patient taking differently: Take 1,000 mg by mouth every 6 (six) hours as needed (Pain). ) 30 tablet 0  . Cyanocobalamin (VITAMIN B 12 PO) Take 1 tablet by mouth daily.     . DULoxetine (CYMBALTA) 60 MG capsule TAKE 1 CAPSULE(60 MG) BY MOUTH DAILY 30 capsule 0  . ferrous sulfate 325 (65 FE) MG tablet You can buy this over the counter without a prescription.  Take one daily with supper after your bowel movements are more regular.  Start around 07/26/15. (Patient taking differently: Take 325 mg by mouth daily. )  3  . Prenatal Vit-Fe Fumarate-FA (PRENATAL MULTIVITAMIN) TABS tablet Take 1 tablet by mouth daily.     Marland Kitchen saccharomyces boulardii (FLORASTOR) 250 MG capsule Take 1 capsule (250 mg total) by mouth 2 (two) times daily. 60 capsule 0   No current facility-administered medications on file prior to visit.    BP 102/76 mmHg  Pulse 88  Temp(Src) 98.1 F (36.7 C) (Oral)  Ht 5\' 2"  (1.575 m)  Wt 132 lb 12.8 oz (60.238 kg)  BMI 24.28 kg/m2  SpO2 98%       Objective:   Physical Exam  General Mental Status- Alert. General Appearance- Not in acute distress.   Skin General: Color- Normal Color. Moisture- Normal Moisture. Small tiny moles on back. One new lesion left side abdomen. 8 mm in size. Slight amorphous in appearance. Just noticed a couple of weeks ago.  Neck Carotid Arteries- Normal  color. Moisture- Normal Moisture. No carotid bruits. No JVD. Lt side neck on palpation no obvious mass, lipoma or abnormal lymph nodes.  Chest and Lung Exam Auscultation: Breath Sounds:-Normal.  Cardiovascular Auscultation:Rythm- Regular. Murmurs & Other Heart Sounds:Auscultation of the heart reveals- No Murmurs.  Abdomen Inspection:-Inspeection Normal. Palpation/Percussion:Note:No mass. Palpation and Percussion of the abdomen reveal- Non Tender, Non Distended + BS, no rebound or guarding.  Neurologic Cranial Nerve exam:- CN III-XII intact(No nystagmus), symmetric smile. Strength:- 5/5 equal and symmetric strength both upper and lower extremities.      Assessment & Plan:  For your wellness exam will get cmp, tsh, and UA.  If you want hep c screening please contact insurance first to see if covered.  If you get motivated to stop smoking notify me and could rx wellbutrin.  Will go ahead and refer to dermatologist for skin surveillance exam and check lesion on stomach.  For you area of very rare transient neck pain notify me if this area get more frequent or any palpable abnormality. If so then will get [place] of area.  Follow up date to be determined after lab review.  Please schedule your colonoscopy.   Harvie Heck, Percell Miller, PA-C

## 2016-04-07 NOTE — Progress Notes (Signed)
Pre visit review using our clinic review tool, if applicable. No additional management support is needed unless otherwise documented below in the visit note. 

## 2016-04-09 LAB — URINE CULTURE

## 2016-04-09 MED ORDER — CIPROFLOXACIN HCL 500 MG PO TABS: 500.0000 mg | ORAL_TABLET | Freq: Two times a day (BID) | ORAL | Status: DC

## 2016-04-09 MED ORDER — TRAMADOL HCL 50 MG PO TABS: 50.0000 mg | ORAL_TABLET | Freq: Three times a day (TID) | ORAL | Status: DC | PRN

## 2016-04-09 NOTE — Telephone Encounter (Signed)
Rx cipro sent to pharmacy.

## 2016-04-09 NOTE — Telephone Encounter (Signed)
Rx for tramadol 50 mg sent to pharmacy. Pt is aware that Cipro is at pharmacy as well.

## 2016-04-09 NOTE — Addendum Note (Signed)
Addended by: Tasia Catchings on: 04/09/2016 11:05 AM   Modules accepted: SmartSet

## 2016-04-30 ENCOUNTER — Other Ambulatory Visit: Payer: Self-pay | Admitting: Medical

## 2016-05-03 ENCOUNTER — Ambulatory Visit (INDEPENDENT_AMBULATORY_CARE_PROVIDER_SITE_OTHER): Payer: Managed Care, Other (non HMO) | Admitting: Medical

## 2016-05-03 ENCOUNTER — Encounter: Payer: Self-pay | Admitting: Medical

## 2016-05-03 VITALS — BP 120/84 | HR 77 | Temp 98.1°F | Ht 62.0 in | Wt 130.0 lb

## 2016-05-03 DIAGNOSIS — F32A Depression, unspecified: Secondary | ICD-10-CM

## 2016-05-03 DIAGNOSIS — Z720 Tobacco use: Secondary | ICD-10-CM | POA: Diagnosis not present

## 2016-05-03 DIAGNOSIS — Z87440 Personal history of urinary (tract) infections: Secondary | ICD-10-CM | POA: Diagnosis not present

## 2016-05-03 DIAGNOSIS — M542 Cervicalgia: Secondary | ICD-10-CM | POA: Diagnosis not present

## 2016-05-03 DIAGNOSIS — Z87891 Personal history of nicotine dependence: Secondary | ICD-10-CM

## 2016-05-03 DIAGNOSIS — L989 Disorder of the skin and subcutaneous tissue, unspecified: Secondary | ICD-10-CM

## 2016-05-03 DIAGNOSIS — Z8719 Personal history of other diseases of the digestive system: Secondary | ICD-10-CM | POA: Diagnosis not present

## 2016-05-03 DIAGNOSIS — F329 Major depressive disorder, single episode, unspecified: Secondary | ICD-10-CM

## 2016-05-03 MED ORDER — DULOXETINE HCL 60 MG PO CPEP: 60.0000 mg | ORAL_CAPSULE | Freq: Every day | ORAL | 1 refills | Status: DC

## 2016-05-03 NOTE — Patient Instructions (Addendum)
For your history of smoking, let me know if you want me to write medication to help you stop.  Your neck pain has resolved since last visit. Let me know if reoccurs.  Your report no uti symptoms. And by exam I don't think you have any diverticulitis. If any severe constant type pain would get cbc and ct abd/pelvis. Let us know.  For depression will refill your cymbalta.   Please schedule colonoscopy.  I did send message to [facility] to check on your derm referral.  Follow up in 3 months or as needed

## 2016-05-03 NOTE — Telephone Encounter (Signed)
I don't see where a referral was placed for Dermatology.

## 2016-05-03 NOTE — Progress Notes (Signed)
Subjective:    Patient ID: [name], female    DOB: April 26, 1961, 55 y.o.   MRN: [id number]  HPI  Pt in for follow up. She on review still does not want to stop smoking. Declines use of Wellbutrin.  Pt had small lesion on her abdomen region. She has not got call from our office or dermatologist.   Pt no longer has any neck are pain since I last saw her.  Pt is going to schedule her colonoscopy.   Pt had uti by culture and I gave cipro. She had some left lower quadrant pain. She had some mild discomfort around 04-09-2016 and that resolved.  Pt also needs refill of her cymbalta. Doing ok with her mood. But this is month her husband passed away. So indicates always some struggle.     Review of Systems  Constitutional: Negative for chills, fatigue and fever.  Respiratory: Negative for cough, shortness of breath and wheezing.   Cardiovascular: Negative for chest pain and palpitations.  Gastrointestinal: Negative for abdominal distention, abdominal pain, constipation, diarrhea and vomiting.       On faint transient abdomen pain left of umbilicus this am. But now mostly resolved.  Endocrine: Negative for polydipsia, polyphagia and polyuria.  Genitourinary: Negative for dysuria, flank pain, frequency and urgency.  Musculoskeletal: Negative for back pain.  Skin: Negative for rash.  Neurological: Negative for dizziness, weakness and numbness.  Hematological: Negative for adenopathy. Does not bruise/bleed easily.  Psychiatric/Behavioral: Negative for behavioral problems, decreased concentration and suicidal ideas. The patient is not nervous/anxious.     Past Medical History:  Diagnosis Date  . Arthritis    oa left hip, sees dr Lyla Glassing  . Chronic back pain   . Depression    pt states when her husband passed away. now feels fine.  . Diverticulitis   . Melanoma ([place]) more than 10 yrs ago   Chest      Social History   Social History  . Marital status: Widowed    Spouse name:  N/A  . Number of children: N/A  . Years of education: N/A   Occupational History  . Not on file.   Social History Main Topics  . Smoking status: Current Every Day Smoker    Packs/day: 1.00    Years: 30.00    Types: Cigarettes  . Smokeless tobacco: Never Used     Comment: Pt info given 05-30-2015  . Alcohol use 0.0 oz/week     Comment: 2 glasses of wine a day  . Drug use: No  . Sexual activity: No   Other Topics Concern  . Not on file   Social History Narrative  . No narrative on file    Past Surgical History:  Procedure Laterality Date  . ABDOMINAL HYSTERECTOMY  1989   Ovaries in place  . BACK SURGERY   20 yrs ago   lower  . COLOSTOMY TAKEDOWN N/A 10/22/2015   Procedure: LAPAROSCOPY WITH INTEROLYSIS, CLOSURE OF HARTMANS POUCH, REMOVAL OF LEFT OVARIAN CYST;  Surgeon: Johnathan Hausen, MD;  Location: [facility];  Service: General;  Laterality: N/A;  . FLEXIBLE SIGMOIDOSCOPY N/A 06/05/2015   Procedure: FLEXIBLE SIGMOIDOSCOPY;  Surgeon: Manus Gunning, MD;  Location: [facility];  Service: Gastroenterology;  Laterality: N/A;  . MELANOMA EXCISION    . PARTIAL COLECTOMY N/A 07/15/2015   Procedure: LAPAROSCOPIC ASSISTED SIGMOID COLECTOMY WITH COLOSTOMY, HARTMANN PROCEDURE, WITH PLACEMENT OF WOUND VAC;  Surgeon: Johnathan Hausen, MD;  Location: [facility];  Service: General;  Laterality: N/A;  . TONSILLECTOMY  as child    Family History  Problem Relation Age of Onset  . Hypertension Father   . Cancer Maternal Aunt     breast  . Cancer Maternal Uncle     leukemia  . Cancer Maternal Grandmother 40    breast  . Heart disease Neg Hx   . Stroke Neg Hx   . Cancer Maternal Uncle     colon    Allergies  Allergen Reactions  . Ampicillin Nausea And Vomiting    Has patient had a PCN reaction causing immediate rash, facial/tongue/throat swelling, SOB or lightheadedness with hypotension: No Has patient had a PCN reaction causing severe rash involving mucus membranes or skin necrosis:  No Has patient had a PCN reaction that required hospitalization No Has patient had a PCN reaction occurring within the last 10 years: Yes If all of the above answers are "NO", then may proceed with Cephalosporin use.    Current Outpatient Prescriptions on File Prior to Visit  Medication Sig Dispense Refill  . acetaminophen (TYLENOL) 500 MG tablet Take 2 tablets (1,000 mg total) by mouth every 6 (six) hours. (Patient taking differently: Take 1,000 mg by mouth every 6 (six) hours as needed (Pain). ) 30 tablet 0  . ciprofloxacin (CIPRO) 500 MG tablet Take 1 tablet (500 mg total) by mouth 2 (two) times daily. 14 tablet 0  . Cyanocobalamin (VITAMIN B 12 PO) Take 1 tablet by mouth daily.     . DULoxetine (CYMBALTA) 60 MG capsule TAKE 1 CAPSULE(60 MG) BY MOUTH DAILY 30 capsule 0  . ferrous sulfate 325 (65 FE) MG tablet You can buy this over the counter without a prescription.  Take one daily with supper after your bowel movements are more regular.  Start around 07/26/15. (Patient taking differently: Take 325 mg by mouth daily. )  3  . Prenatal Vit-Fe Fumarate-FA (PRENATAL MULTIVITAMIN) TABS tablet Take 1 tablet by mouth daily.     Marland Kitchen saccharomyces boulardii (FLORASTOR) 250 MG capsule Take 1 capsule (250 mg total) by mouth 2 (two) times daily. 60 capsule 0  . traMADol (ULTRAM) 50 MG tablet Take 1 tablet (50 mg total) by mouth every 8 (eight) hours as needed. 10 tablet 0   No current facility-administered medications on file prior to visit.     BP 120/84 (BP Location: Right Arm, Patient Position: Sitting, Cuff Size: Normal)   Pulse 77   Temp 98.1 F (36.7 C) (Oral)   Ht 5\' 2"  (1.575 m)   Wt 130 lb (59 kg)   SpO2 97%   BMI 23.78 kg/m       Objective:   Physical Exam  General Mental Status- Alert. General Appearance- Not in acute distress.   Skin General: Color- Normal Color. Moisture- Normal Moisture.  Neck Carotid Arteries- Normal color. Moisture- Normal Moisture. No carotid bruits. No  JVD.  Chest and Lung Exam Auscultation: Breath Sounds:-Normal.  Cardiovascular Auscultation:Rythm- Regular. Murmurs & Other Heart Sounds:Auscultation of the heart reveals- No Murmurs.  Abdomen Inspection:-Inspeection Normal. Palpation/Percussion:Note:No mass. Palpation and Percussion of the abdomen reveal- Non Tender(no suprapubic tenderness), Non Distended + BS, no rebound or guarding.   Neurologic Cranial Nerve exam:- CN III-XII intact(No nystagmus), symmetric smile. Strength:- 5/5 equal and symmetric strength both upper and lower extremities.  Back- no cva tenderness.      Assessment & Plan:  For your history of smoking, let me know if you want me to write medication to help you stop.  Your neck pain has resolved since last visit. Let me know if reoccurs.  Your report no uti symptoms. And by exam I don't think you have any diverticulitis. If any severe constant type pain would get cbc and ct abd/pelvis. Let us know.  For depression will refill your cymbalta.   Follow up in 3 months or as needed  Rebecca Motta, Percell Miller, [employer]

## 2016-05-03 NOTE — Addendum Note (Signed)
Addended by: Anabel Halon on: 05/03/2016 09:23 AM   Modules accepted: Orders

## 2016-05-03 NOTE — Progress Notes (Signed)
Pre visit review using our clinic review tool, if applicable. No additional management support is needed unless otherwise documented below in the visit note./[facility]  

## 2016-05-03 NOTE — Telephone Encounter (Signed)
Will you check on pt derm referral.

## 2016-05-10 ENCOUNTER — Encounter: Payer: Self-pay | Admitting: Internal Medicine

## 2016-07-02 ENCOUNTER — Ambulatory Visit (AMBULATORY_SURGERY_CENTER): Payer: Self-pay | Admitting: *Deleted

## 2016-07-02 VITALS — Ht 62.0 in | Wt 135.2 lb

## 2016-07-02 DIAGNOSIS — Z1211 Encounter for screening for malignant neoplasm of colon: Secondary | ICD-10-CM

## 2016-07-02 MED ORDER — NA SULFATE-K SULFATE-MG SULF 17.5-3.13-1.6 GM/177ML PO SOLN: 1.0000 | Freq: Once | ORAL | 0 refills | Status: AC

## 2016-07-02 NOTE — Progress Notes (Signed)
No allergies to eggs or soy. No problems with anesthesia.  Pt given Emmi instructions for colonoscopy  No oxygen use  No diet drug use  

## 2016-07-07 ENCOUNTER — Encounter: Payer: Self-pay | Admitting: Internal Medicine

## 2016-07-16 ENCOUNTER — Ambulatory Visit (AMBULATORY_SURGERY_CENTER): Payer: Managed Care, Other (non HMO) | Admitting: Internal Medicine

## 2016-07-16 ENCOUNTER — Encounter: Payer: Self-pay | Admitting: Internal Medicine

## 2016-07-16 VITALS — BP 139/76 | HR 96 | Temp 97.3°F | Resp 18 | Ht 62.0 in | Wt 135.0 lb

## 2016-07-16 DIAGNOSIS — Z1211 Encounter for screening for malignant neoplasm of colon: Secondary | ICD-10-CM

## 2016-07-16 DIAGNOSIS — Z1212 Encounter for screening for malignant neoplasm of rectum: Secondary | ICD-10-CM | POA: Diagnosis not present

## 2016-07-16 DIAGNOSIS — D124 Benign neoplasm of descending colon: Secondary | ICD-10-CM

## 2016-07-16 MED ORDER — SODIUM CHLORIDE 0.9 % IV SOLN: 500.0000 mL | INTRAVENOUS | Status: DC

## 2016-07-16 NOTE — Progress Notes (Signed)
A and O x3. Report to RN. Tolerated MAC anesthesia well. 

## 2016-07-16 NOTE — Progress Notes (Signed)
Called to room to assist during endoscopic procedure.  Patient ID and intended procedure confirmed with present staff. Received instructions for my participation in the procedure from the performing physician.   Jerilee Field, RN entered this in error.  maw

## 2016-07-16 NOTE — Progress Notes (Signed)
Called to room to assist during endoscopic procedure.  Patient ID and intended procedure confirmed with present staff. Received instructions for my participation in the procedure from the performing physician.  

## 2016-07-16 NOTE — Patient Instructions (Signed)
Impression/Recommendations:  Polyp handout given to patient. Diverticulosis handout given to patient. Hemorrhoid handout given to patient.  Repeat colonoscopy in 5 years for surveillance.  YOU HAD AN ENDOSCOPIC PROCEDURE TODAY AT [facility]:   Refer to the procedure report that was given to you for any specific questions about what was found during the examination.  If the procedure report does not answer your questions, please call your gastroenterologist to clarify.  If you requested that your care partner not be given the details of your procedure findings, then the procedure report has been included in a sealed envelope for you to review at your convenience later.  YOU SHOULD EXPECT: Some feelings of bloating in the abdomen. Passage of more gas than usual.  Walking can help get rid of the air that was put into your GI tract during the procedure and reduce the bloating. If you had a lower endoscopy (such as a colonoscopy or flexible sigmoidoscopy) you may notice spotting of blood in your stool or on the toilet paper. If you underwent a bowel prep for your procedure, you may not have a normal bowel movement for a few days.  Please Note:  You might notice some irritation and congestion in your nose or some drainage.  This is from the oxygen used during your procedure.  There is no need for concern and it should clear up in a day or so.  SYMPTOMS TO REPORT IMMEDIATELY:   Following lower endoscopy (colonoscopy or flexible sigmoidoscopy):  Excessive amounts of blood in the stool  Significant tenderness or worsening of abdominal pains  Swelling of the abdomen that is new, acute  Fever of 100F or higher For urgent or emergent issues, a gastroenterologist can be reached at any hour by calling [phone].   DIET:  We do recommend a small meal at first, but then you may proceed to your regular diet.  Drink plenty of fluids but you should avoid alcoholic beverages for 24  hours.  ACTIVITY:  You should plan to take it easy for the rest of today and you should NOT DRIVE or use heavy machinery until tomorrow (because of the sedation medicines used during the test).    FOLLOW UP: Our staff will call the number listed on your records the next business day following your procedure to check on you and address any questions or concerns that you may have regarding the information given to you following your procedure. If we do not reach you, we will leave a message.  However, if you are feeling well and you are not experiencing any problems, there is no need to return our call.  We will assume that you have returned to your regular daily activities without incident.  If any biopsies were taken you will be contacted by phone or by letter within the next 1-3 weeks.  Please call us at [phone] if you have not heard about the biopsies in 3 weeks.    SIGNATURES/CONFIDENTIALITY: You and/or your care partner have signed paperwork which will be entered into your electronic medical record.  These signatures attest to the fact that that the information above on your After Visit Summary has been reviewed and is understood.  Full responsibility of the confidentiality of this discharge information lies with you and/or your care-partner.

## 2016-07-16 NOTE — Op Note (Signed)
[facility] Patient Name: [name] Procedure Date: 07/16/2016 9:54 AM MRN: [id number] Endoscopist: Docia Chuck. Henrene Pastor , MD Age: 55 Referring MD:  Date of Birth: 11/04/1960 Gender: Female Account #: [id number] Procedure:                Colonoscopy, with cold snare polypectomy x 1 Indications:              Screening for colorectal malignant neoplasm. 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 Subsequent takedown February 2017 Medicines:                Monitored Anesthesia Care Procedure:                Pre-Anesthesia Assessment:                           - Prior to the procedure, a History and Physical                            was performed, and patient medications and                            allergies were reviewed. The patient's tolerance of                            previous anesthesia was also reviewed. The risks                            and benefits of the procedure and the sedation                            options and risks were discussed with the patient.                            All questions were answered, and informed consent                            was obtained. Prior Anticoagulants: The patient has                            taken no previous anticoagulant or antiplatelet                            agents. ASA Grade Assessment: II - A patient with                            mild systemic disease. After reviewing the risks  and benefits, the patient was deemed in                            satisfactory condition to undergo the procedure.                           After obtaining informed consent, the colonoscope                            was passed under direct vision. Throughout the                            procedure, the patient's blood pressure, pulse, and                       oxygen saturations were monitored continuously. The                            Model CF-HQ190L [id number]) scope was introduced                            through the anus and advanced to the the cecum,                            identified by appendiceal orifice and ileocecal                            valve. The ileocecal valve, appendiceal orifice,                            and rectum were photographed. The quality of the                            bowel preparation was excellent. The colonoscopy                            was performed without difficulty. The patient                            tolerated the procedure well. The bowel preparation                            used was SUPREP. Scope In: 9:59:53 AM Scope Out: 10:12:16 AM Scope Withdrawal Time: 0 hours 8 minutes 32 seconds  Total Procedure Duration: 0 hours 12 minutes 23 seconds  Findings:                 A 4 mm polyp was found in the proximal descending                            colon. The polyp was removed with a cold snare.                            Resection and retrieval were complete.  A few medium-mouthed diverticula were found in the                            left colon and right colon.                           Internal hemorrhoids (small) were found during                            retroflexion.                           The colocolonic anastomosis at 15 cm appeared                            healthy.The exam was otherwise without abnormality                            on direct and retroflexion views. Complications:            No immediate complications. Estimated blood loss:                            None. Estimated Blood Loss:     Estimated blood loss: none. Impression:               - One 4 mm polyp in the proximal descending colon,                            removed with a cold snare. Resected and retrieved.                           - Diverticulosis in the  left colon and in the right                            colon.                           - Internal hemorrhoids.                           - The examination was otherwise normal on direct                            and retroflexion views. Recommendation:           - Repeat colonoscopy in 5 years for surveillance.                           - Patient has a contact number available for                            emergencies. The signs and symptoms of potential                            delayed complications were discussed with the  patient. Return to normal activities tomorrow.                            Written discharge instructions were provided to the                            patient.                           - Resume previous diet.                           - Continue present medications.                           - Await pathology results. Docia Chuck. Henrene Pastor, MD 07/16/2016 10:25:42 AM This report has been signed electronically.

## 2016-07-19 ENCOUNTER — Telehealth: Payer: Self-pay

## 2016-07-19 NOTE — Telephone Encounter (Signed)
  Follow up Call-  Call back number 07/16/2016  Post procedure Call Back phone  # [phone]  Permission to leave phone message Yes     Patient questions:  Do you have a fever, pain , or abdominal swelling? No. Pain Score  0 *  Have you tolerated food without any problems? Yes.    Have you been able to return to your normal activities? Yes.    Do you have any questions about your discharge instructions: Diet   No. Medications  No. Follow up visit  No.  Do you have questions or concerns about your Care? No.  Actions: * If pain score is 4 or above: No action needed, pain <4.

## 2016-07-21 ENCOUNTER — Encounter: Payer: Self-pay | Admitting: Internal Medicine

## 2016-10-23 IMAGING — DX DG ABDOMEN 1V
2 series · 2 of 2 positions shown · non-contrast
Comparison: None in PACs

CLINICAL DATA: One month history of diarrhea, leaking of stool this
past weekend

EXAM:
ABDOMEN - 1 VIEW

[abdomen kub (1 of 2)]
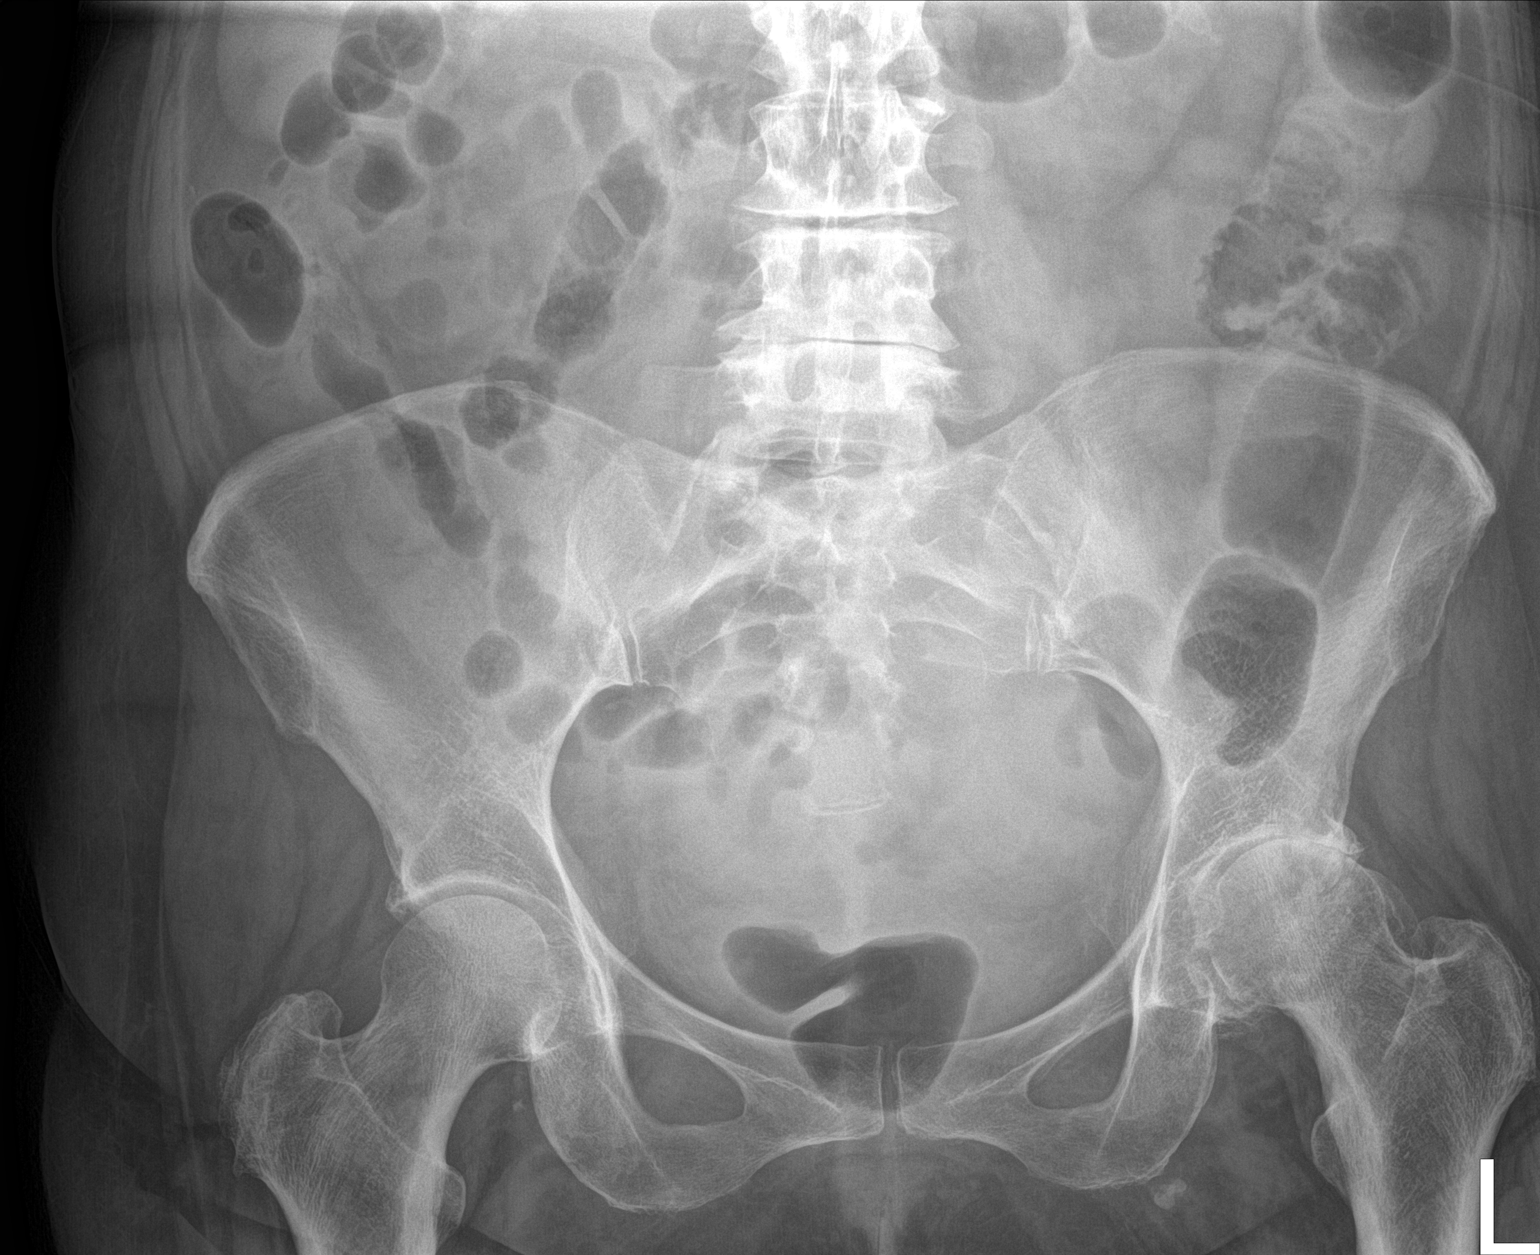

[abdomen kub (2 of 2)]
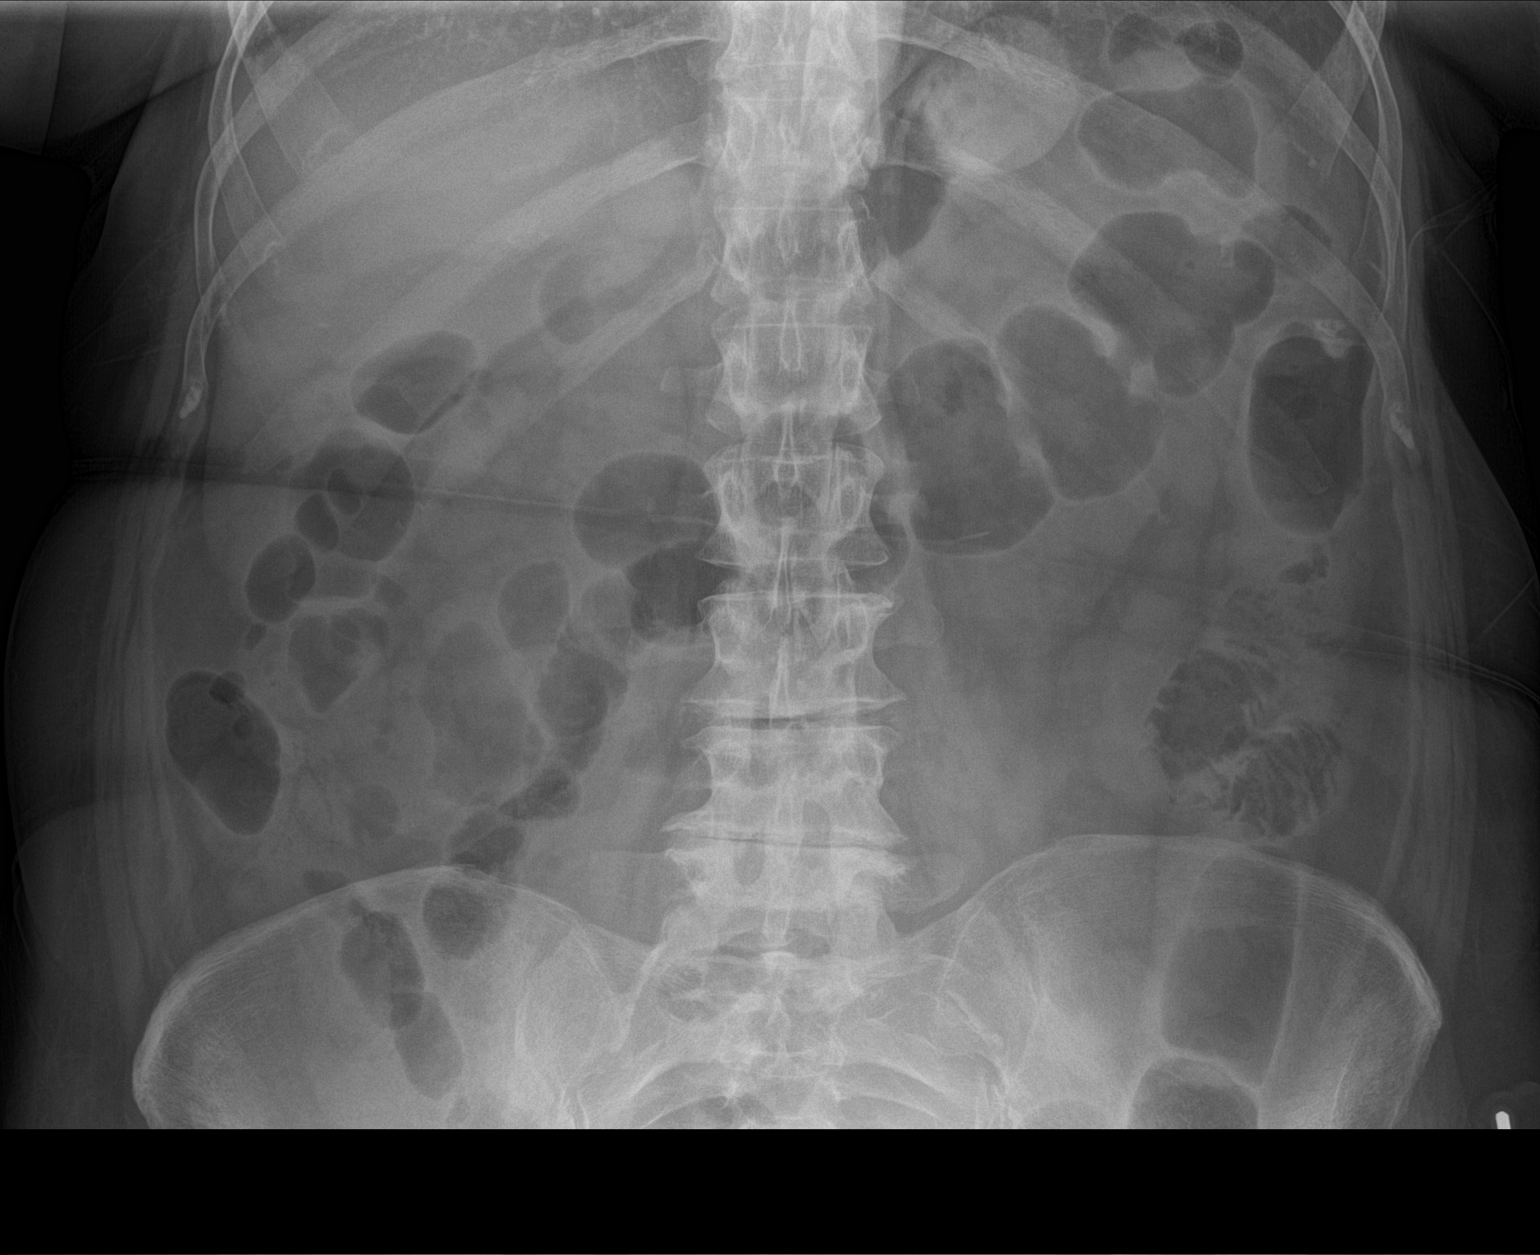

[2 of 2 positions shown; findings below may reference images not displayed]

FINDINGS: There is a moderate amount of gas throughout the colon. There is a
moderate stool burden as well. There is no evidence of a fecal
impaction however. No evidence of small-bowel obstruction is a
apparent. No free extraluminal gas collections are demonstrated.
There are degenerative changes of the lower lumbar discs. There is
degenerative change of the left hip.
IMPRESSION: The bowel gas pattern is not clearly abnormal. There may be mildly
increased colonic stool burden but there is no evidence of fecal
impaction.

## 2016-10-30 ENCOUNTER — Other Ambulatory Visit: Payer: Self-pay | Admitting: Medical

## 2016-12-03 IMAGING — CT CT CHEST W/ CM
1 of 2 series · 14 of 31 positions shown, 18 images · IV contrast (OMNIPAQUE)
Comparison: Abdominal CT 06/17/2015.  No previous chest CT.

CLINICAL DATA: Restaging left chest melanoma diagnosed in 2757.
Initial encounter.

EXAM:
CT CHEST WITH CONTRAST
TECHNIQUE: Multidetector CT imaging of the chest was performed during
intravenous contrast administration.
CONTRAST:  75mL OMNIPAQUE IOHEXOL 300 MG/ML  SOLN

[Series 2: rtn chest with st · axial · 0.71mm/px · z∈[-291,-26]mm · 14 of 63 slices shown, 18 images]
[im 5/63  mediastinal]
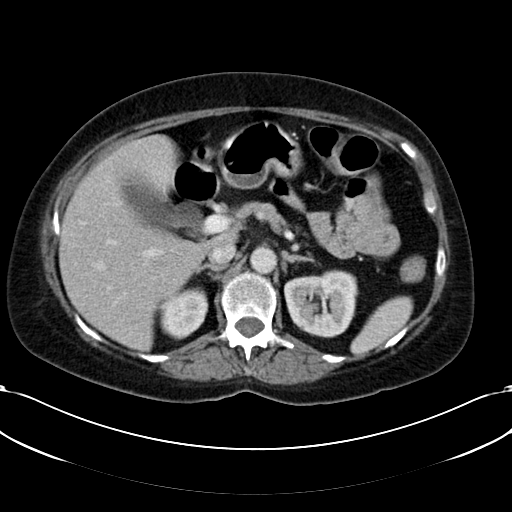
[im 5/63  lung]
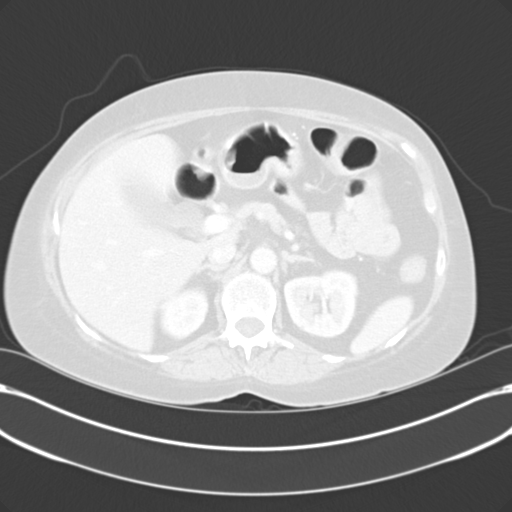
[im 10/63  lung]
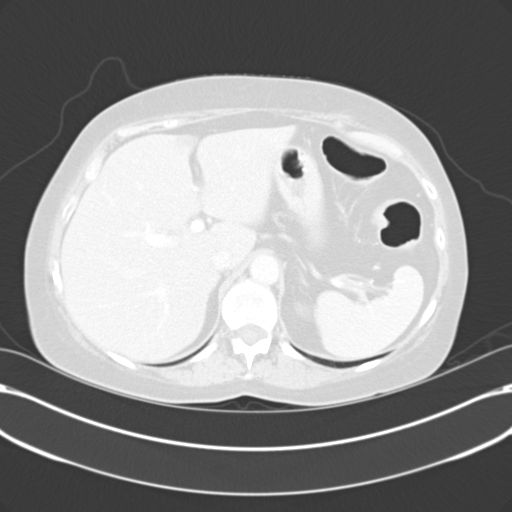
[im 15/63  lung]
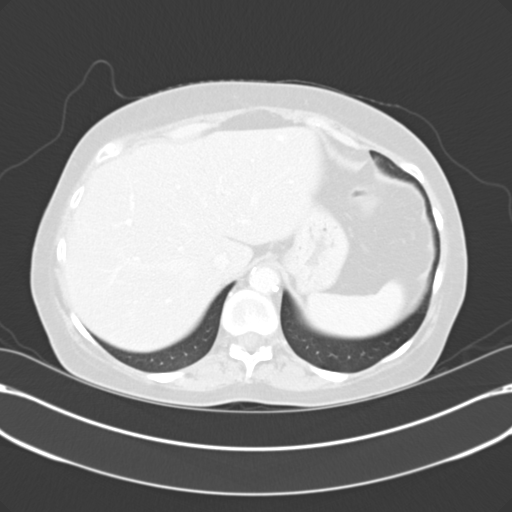
[im 20/63  lung]
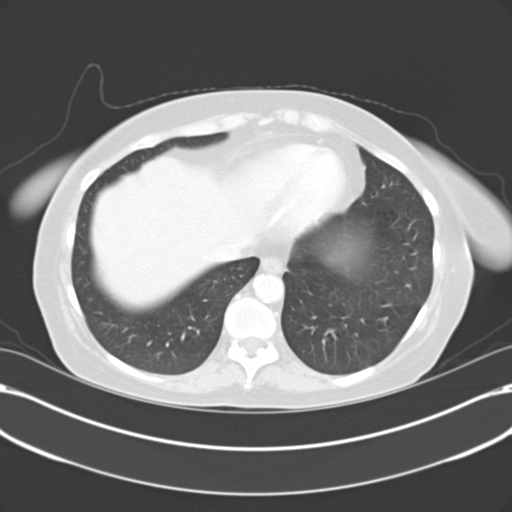
[im 24/63  mediastinal]
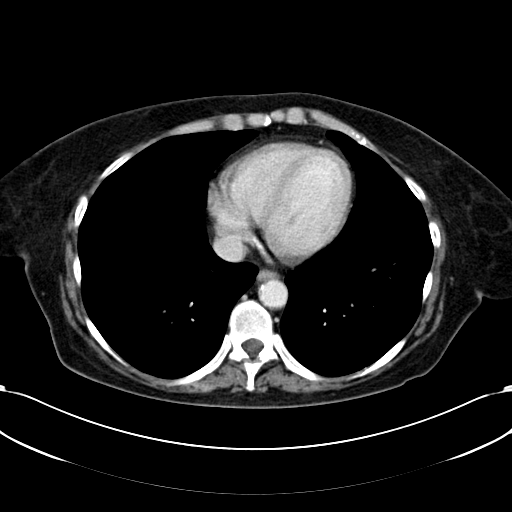
[im 24/63  lung]
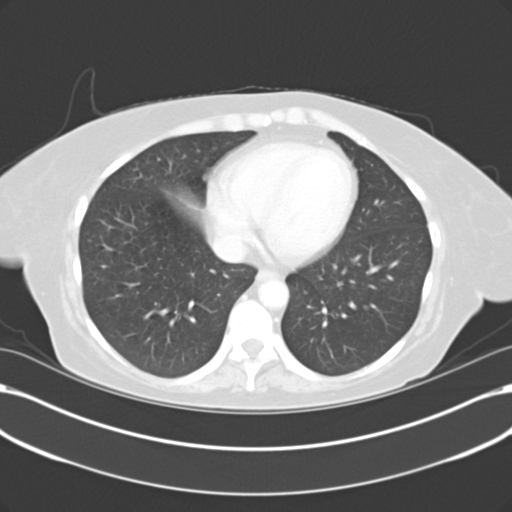
[im 29/63  lung]
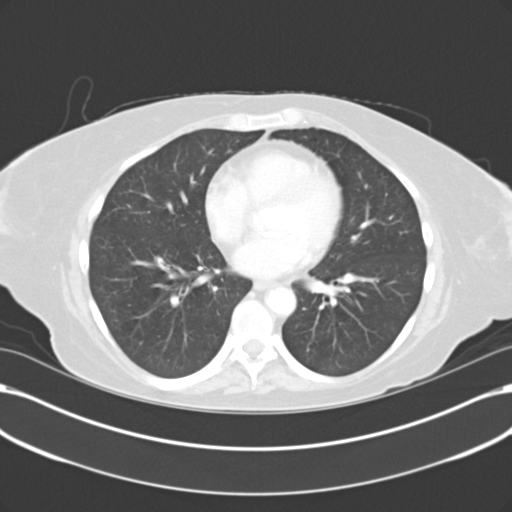
[im 30/63  lung]
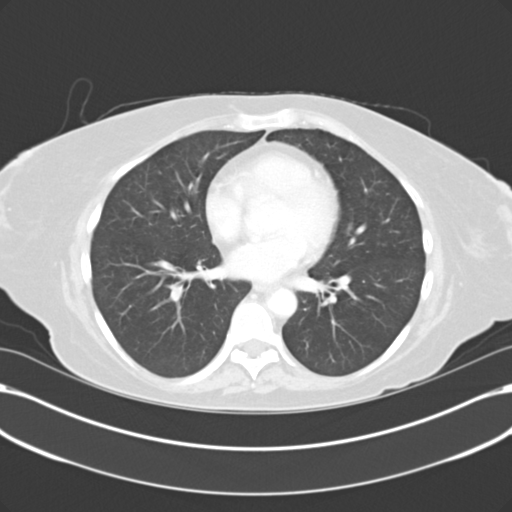
[im 32/63  lung]
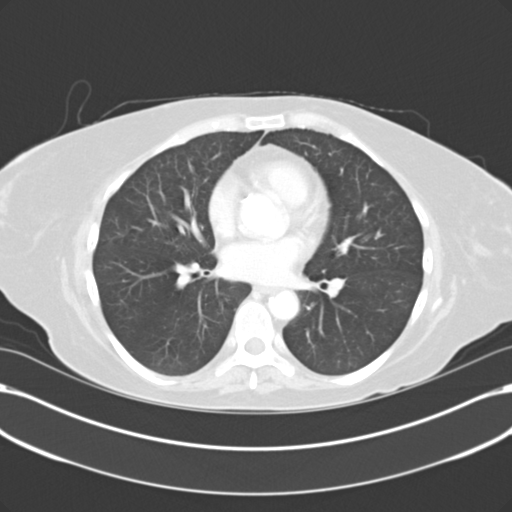
[im 34/63  mediastinal]
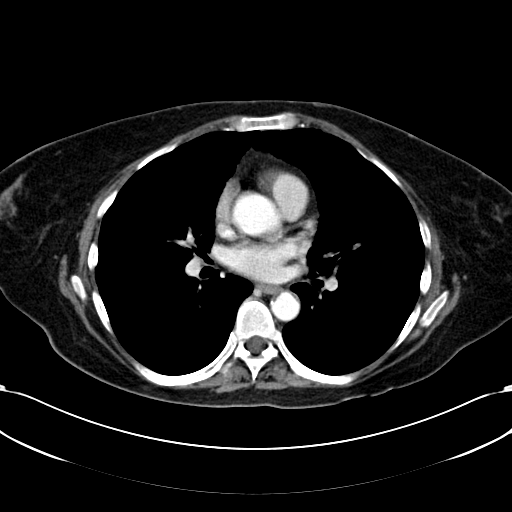
[im 34/63  lung]
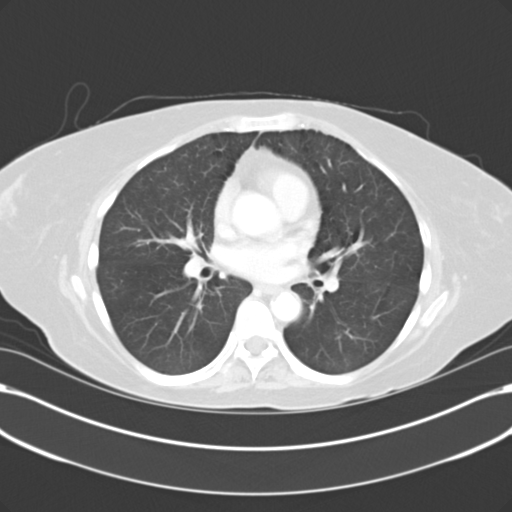
[im 39/63  lung]
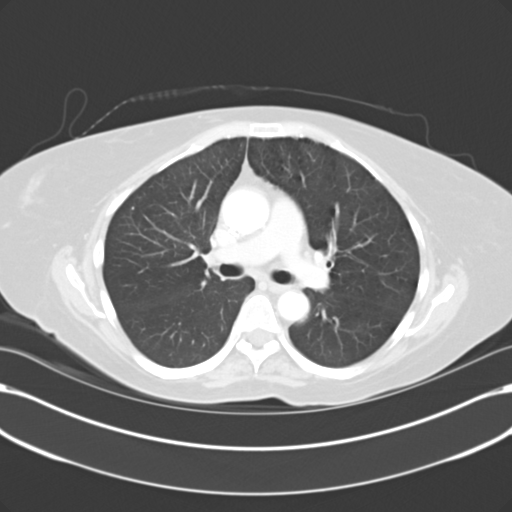
[im 43/63  lung]
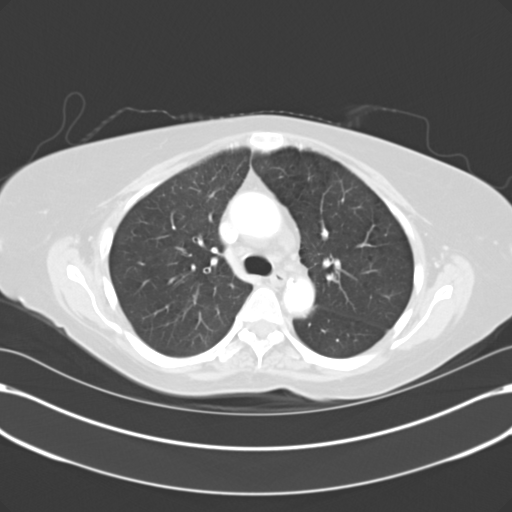
[im 48/63  lung]
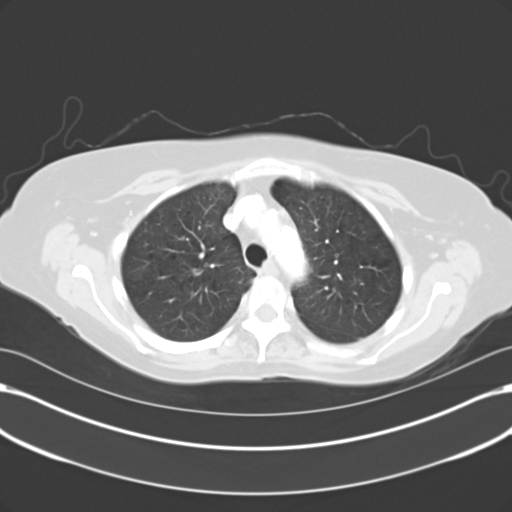
[im 53/63  mediastinal]
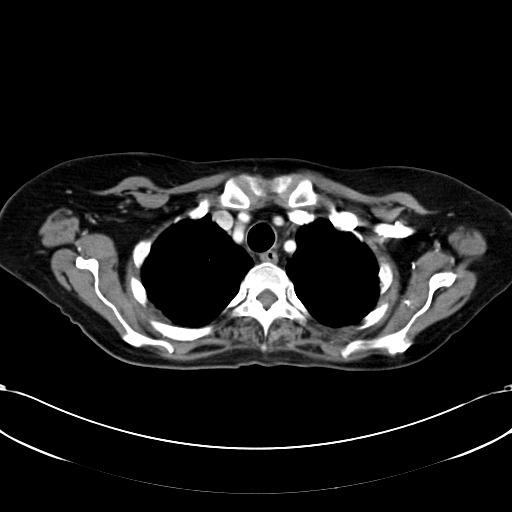
[im 53/63  lung]
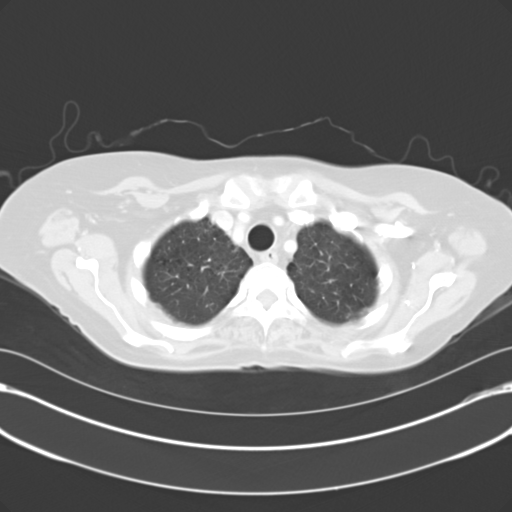
[im 58/63  lung]
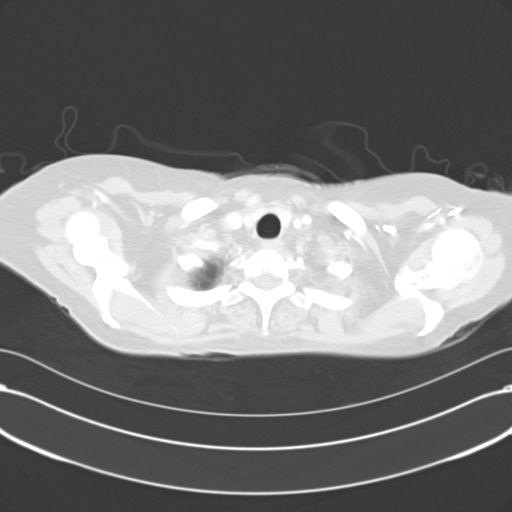

[14 of 31 positions shown; findings below may reference images not displayed]

FINDINGS: Mediastinum/Nodes: There are no enlarged mediastinal, hilar or
axillary lymph nodes. The thyroid gland, trachea and esophagus
demonstrate no significant findings. The heart size is normal. There
is no pericardial effusion. There is moderate atherosclerosis of the
aorta, great vessels and coronary arteries.

Lungs/Pleura: There is no pleural effusion. The lungs demonstrate
emphysema with small calcified granulomas in both upper lobes
(images 25 and 26). In addition, there are tiny noncalcified
pulmonary nodules in both upper lobes, seen on images 15 and 20. No
highly suspicious nodules demonstrated.

Upper abdomen: The visualized upper abdomen demonstrates no
significant findings. There is focal fat adjacent to the falciform
ligament which appears unchanged. There is a small hypervascular
lesion in the left hepatic lobe on image 49, stable and likely a
small hemangioma or other incidental vascular lesion. A left renal
parapelvic cyst appears unchanged.

Musculoskeletal/Chest wall: There is no chest wall mass or
suspicious osseous finding.
IMPRESSION: 1. No acute chest findings.
2. No findings suspicious for metastatic disease. There are tiny
calcified and noncalcified pulmonary nodules bilaterally which are
nonspecific although likely postinflammatory. Stability of these
nodules could be addressed with follow-up CT in 6-12 months.
Fleischner criteria do not apply given the history of melanoma.
3. Moderate atherosclerosis and emphysema noted.

## 2017-02-06 ENCOUNTER — Other Ambulatory Visit: Payer: Self-pay | Admitting: Medical

## 2017-02-17 ENCOUNTER — Ambulatory Visit: Payer: Managed Care, Other (non HMO) | Admitting: Medical

## 2017-02-17 ENCOUNTER — Telehealth: Payer: Self-pay | Admitting: Medical

## 2017-02-17 NOTE — Telephone Encounter (Signed)
Patient cancelled 8am appointment today due to personal conflict, patient [name] with PCP (Dr. Charlett Blake) for 02/18/17 charge or no charge

## 2017-02-17 NOTE — Telephone Encounter (Signed)
No charge. 

## 2017-02-18 ENCOUNTER — Encounter: Payer: Self-pay | Admitting: Family Medicine

## 2017-02-18 ENCOUNTER — Ambulatory Visit (INDEPENDENT_AMBULATORY_CARE_PROVIDER_SITE_OTHER): Payer: Managed Care, Other (non HMO) | Admitting: Family Medicine

## 2017-02-18 VITALS — BP 126/82 | HR 83 | Temp 97.7°F | Resp 18 | Wt 146.4 lb

## 2017-02-18 DIAGNOSIS — Z720 Tobacco use: Secondary | ICD-10-CM | POA: Diagnosis not present

## 2017-02-18 DIAGNOSIS — R32 Unspecified urinary incontinence: Secondary | ICD-10-CM | POA: Diagnosis not present

## 2017-02-18 DIAGNOSIS — R600 Localized edema: Secondary | ICD-10-CM

## 2017-02-18 DIAGNOSIS — T148XXA Other injury of unspecified body region, initial encounter: Secondary | ICD-10-CM

## 2017-02-18 HISTORY — DX: Tobacco use: Z72.0

## 2017-02-18 HISTORY — DX: Unspecified urinary incontinence: R32

## 2017-02-18 LAB — CBC WITH DIFFERENTIAL/PLATELET
BASOS ABS: 0 10*3/uL (ref 0.0–0.1)
Basophils Relative: 0.2 % (ref 0.0–3.0)
EOS ABS: 0 10*3/uL (ref 0.0–0.7)
Eosinophils Relative: 0.2 % (ref 0.0–5.0)
HCT: 37.8 % (ref 36.0–46.0)
Hemoglobin: 12.8 g/dL (ref 12.0–15.0)
LYMPHS ABS: 1.8 10*3/uL (ref 0.7–4.0)
LYMPHS PCT: 14.9 % (ref 12.0–46.0)
MCHC: 33.9 g/dL (ref 30.0–36.0)
MCV: 96.1 fl (ref 78.0–100.0)
MONOS PCT: 7.5 % (ref 3.0–12.0)
Monocytes Absolute: 0.9 10*3/uL (ref 0.1–1.0)
NEUTROS ABS: 9.6 10*3/uL — AB (ref 1.4–7.7)
NEUTROS PCT: 77.2 % — AB (ref 43.0–77.0)
Platelets: 315 10*3/uL (ref 150.0–400.0)
RBC: 3.94 Mil/uL (ref 3.87–5.11)
RDW: 13.5 % (ref 11.5–15.5)
WBC: 12.4 10*3/uL — ABNORMAL HIGH (ref 4.0–10.5)

## 2017-02-18 LAB — URINALYSIS, ROUTINE W REFLEX MICROSCOPIC
BILIRUBIN URINE: NEGATIVE
HGB URINE DIPSTICK: NEGATIVE
Ketones, ur: NEGATIVE
Leukocytes, UA: NEGATIVE
NITRITE: POSITIVE — AB
RBC / HPF: NONE SEEN (ref 0–?)
Specific Gravity, Urine: 1.02 (ref 1.000–1.030)
TOTAL PROTEIN, URINE-UPE24: NEGATIVE
Urine Glucose: NEGATIVE
Urobilinogen, UA: 0.2 (ref 0.0–1.0)
pH: 7 (ref 5.0–8.0)

## 2017-02-18 MED ORDER — NAPROXEN 375 MG PO TABS: 375.0000 mg | ORAL_TABLET | Freq: Two times a day (BID) | ORAL | 1 refills | Status: DC

## 2017-02-18 NOTE — Progress Notes (Signed)
Subjective:  I acted as a Education administrator for Dr. Charlett Blake. [place], [place]   Patient ID: [name], female    DOB: 1961-04-20, 56 y.o.   MRN: [id number]  Chief Complaint  Patient presents with  . Joint Swelling    HPI  Patient is in today for an acute visit she c/o  Both ankles Swollen and bruised. She states this has been on going for about a few weeks. She denies injury. She started taking Meloxicam on 5/24 and she think this may be a reason her ankles could be like this. No recent febrile illness or acute hospitalizations. Denies CP/palp/SOB/HA/congestion/fevers/GI or GU c/o. Taking meds as prescribed.   Patient Care Team: Saguier, Iris Pert as PCP - General (Physician Assistant)   Past Medical History:  Diagnosis Date  . Anemia   . Arthritis    oa left hip, sees dr Lyla Glassing  . Chronic back pain   . Depression    pt states when her husband passed away. now feels fine.  . Diverticulitis   . Melanoma ([place]) more than 10 yrs ago   Chest   . Pedal edema 02/20/2017  . Tobacco use 02/18/2017  . Urinary incontinence 02/18/2017    Past Surgical History:  Procedure Laterality Date  . ABDOMINAL HYSTERECTOMY  1989   Ovaries in place  . BACK SURGERY   20 yrs ago   lower  . COLOSTOMY TAKEDOWN N/A 10/22/2015   Procedure: LAPAROSCOPY WITH INTEROLYSIS, CLOSURE OF HARTMANS POUCH, REMOVAL OF LEFT OVARIAN CYST;  Surgeon: Johnathan Hausen, MD;  Location: [facility];  Service: General;  Laterality: N/A;  . FLEXIBLE SIGMOIDOSCOPY N/A 06/05/2015   Procedure: FLEXIBLE SIGMOIDOSCOPY;  Surgeon: Manus Gunning, MD;  Location: [facility];  Service: Gastroenterology;  Laterality: N/A;  . MELANOMA EXCISION    . PARTIAL COLECTOMY N/A 07/15/2015   Procedure: LAPAROSCOPIC ASSISTED SIGMOID COLECTOMY WITH COLOSTOMY, HARTMANN PROCEDURE, WITH PLACEMENT OF WOUND VAC;  Surgeon: Johnathan Hausen, MD;  Location: [facility];  Service: General;  Laterality: N/A;  . TONSILLECTOMY  as child    Family History  Problem  Relation Age of Onset  . Hypertension Father   . Cancer Maternal Grandmother 40       breast  . Cancer Maternal Aunt        breast  . Colon cancer Maternal Aunt 15  . Cancer Maternal Uncle        leukemia  . Colon cancer Maternal Uncle 39  . Cancer Maternal Uncle        colon  . Colon cancer Maternal Uncle 1  . Colon cancer Maternal Aunt 90  . Heart disease Neg Hx   . Stroke Neg Hx     Social History   Social History  . Marital status: Widowed    Spouse name: N/A  . Number of children: N/A  . Years of education: N/A   Occupational History  . Not on file.   Social History Main Topics  . Smoking status: Current Every Day Smoker    Packs/day: 1.00    Years: 30.00    Types: Cigarettes  . Smokeless tobacco: Never Used     Comment: Pt info given 05-30-2015  . Alcohol use 0.0 oz/week     Comment: 2 glasses of wine a day  . Drug use: No  . Sexual activity: No   Other Topics Concern  . Not on file   Social History Narrative  . No narrative on file    Outpatient Medications Prior  to Visit  Medication Sig Dispense Refill  . DULoxetine (CYMBALTA) 60 MG capsule TAKE ONE CAPSULE BY MOUTH DAILY 90 capsule 0  . saccharomyces boulardii (FLORASTOR) 250 MG capsule Take 1 capsule (250 mg total) by mouth 2 (two) times daily. 60 capsule 0  . acetaminophen (TYLENOL) 500 MG tablet Take 2 tablets (1,000 mg total) by mouth every 6 (six) hours. (Patient taking differently: Take 1,000 mg by mouth every 6 (six) hours as needed (Pain). ) 30 tablet 0  . Cyanocobalamin (VITAMIN B 12 PO) Take 1 tablet by mouth daily.     . DULoxetine (CYMBALTA) 60 MG capsule TAKE 1 CAPSULE(60 MG) BY MOUTH DAILY 30 capsule 0  . ferrous sulfate 325 (65 FE) MG tablet You can buy this over the counter without a prescription.  Take one daily with supper after your bowel movements are more regular.  Start around 07/26/15. (Patient taking differently: Take 325 mg by mouth daily. )  3  . Prenatal Vit-Fe Fumarate-FA  (PRENATAL MULTIVITAMIN) TABS tablet Take 1 tablet by mouth daily.     Marland Kitchen 0.9 %  sodium chloride infusion      No facility-administered medications prior to visit.     Allergies  Allergen Reactions  . Ampicillin Nausea And Vomiting    Has patient had a PCN reaction causing immediate rash, facial/tongue/throat swelling, SOB or lightheadedness with hypotension: No Has patient had a PCN reaction causing severe rash involving mucus membranes or skin necrosis: No Has patient had a PCN reaction that required hospitalization No Has patient had a PCN reaction occurring within the last 10 years: Yes If all of the above answers are "NO", then may proceed with Cephalosporin use.    Review of Systems  Constitutional: Positive for malaise/fatigue. Negative for fever.  HENT: Negative for congestion.   Eyes: Negative for blurred vision.  Respiratory: Negative for shortness of breath.   Cardiovascular: Positive for leg swelling. Negative for chest pain and palpitations.  Gastrointestinal: Negative for abdominal pain, blood in stool and nausea.  Genitourinary: Positive for frequency. Negative for dysuria.  Musculoskeletal: Positive for joint pain. Negative for falls.  Skin: Negative for rash.  Neurological: Negative for dizziness, loss of consciousness and headaches.  Endo/Heme/Allergies: Negative for environmental allergies.  Psychiatric/Behavioral: Negative for depression. The patient is not nervous/anxious.        Objective:    Physical Exam  Constitutional: She is oriented to person, place, and time. She appears well-developed and well-nourished. No distress.  HENT:  Head: Normocephalic and atraumatic.  Nose: Nose normal.  Eyes: Right eye exhibits no discharge. Left eye exhibits no discharge.  Neck: Normal range of motion. Neck supple.  Cardiovascular: Normal rate and regular rhythm.   No murmur heard. Pulmonary/Chest: Effort normal and breath sounds normal.  Abdominal: Soft. Bowel  sounds are normal. There is no tenderness.  Musculoskeletal: She exhibits edema.  Trace to 1+ pedal edema b/l lower extremities  Neurological: She is alert and oriented to person, place, and time. She displays abnormal reflex.  Skin: Skin is warm and dry.  Psychiatric: She has a normal mood and affect.  Nursing note and vitals reviewed.   BP 126/82 (BP Location: Left Arm, Patient Position: Sitting, Cuff Size: Normal)   Pulse 83   Temp 97.7 F (36.5 C) (Oral)   Resp 18   Wt 146 lb 6.4 oz (66.4 kg)   SpO2 97%   BMI 26.78 kg/m  Wt Readings from Last 3 Encounters:  02/18/17 146 lb 6.4 oz (  66.4 kg)  07/16/16 135 lb (61.2 kg)  07/02/16 135 lb 3.2 oz (61.3 kg)   BP Readings from Last 3 Encounters:  02/18/17 126/82  07/16/16 139/76  05/03/16 120/84     Immunization History  Administered Date(s) Administered  . Tdap 08/29/2015    Health Maintenance  Topic Date Due  . Hepatitis C Screening  September 01, 1961  . PAP SMEAR  02/24/1980  . INFLUENZA VACCINE  04/20/2017  . MAMMOGRAM  09/07/2017  . COLONOSCOPY  07/16/2021  . TETANUS/TDAP  08/28/2025  . HIV Screening  Completed    Lab Results  Component Value Date   WBC 12.4 (H) 02/18/2017   HGB 12.8 02/18/2017   HCT 37.8 02/18/2017   PLT 315.0 02/18/2017   GLUCOSE 85 04/07/2016   CHOL 227 (H) 04/07/2016   TRIG 262.0 (H) 04/07/2016   HDL 66.00 04/07/2016   LDLDIRECT 127.0 04/07/2016   ALT 14 04/07/2016   AST 18 04/07/2016   NA 136 04/07/2016   K 4.1 04/07/2016   CL 100 04/07/2016   CREATININE 0.62 04/07/2016   BUN 13 04/07/2016   CO2 30 04/07/2016   TSH 1.86 04/07/2016   INR 1.09 06/04/2015   HGBA1C 5.5 10/17/2015    Lab Results  Component Value Date   TSH 1.86 04/07/2016   Lab Results  Component Value Date   WBC 12.4 (H) 02/18/2017   HGB 12.8 02/18/2017   HCT 37.8 02/18/2017   MCV 96.1 02/18/2017   PLT 315.0 02/18/2017   Lab Results  Component Value Date   NA 136 04/07/2016   K 4.1 04/07/2016   CO2 30  04/07/2016   GLUCOSE 85 04/07/2016   BUN 13 04/07/2016   CREATININE 0.62 04/07/2016   BILITOT 0.6 04/07/2016   ALKPHOS 71 04/07/2016   AST 18 04/07/2016   ALT 14 04/07/2016   PROT 7.1 04/07/2016   ALBUMIN 4.4 04/07/2016   CALCIUM 9.5 04/07/2016   ANIONGAP 8 10/28/2015   GFR 105.41 04/07/2016   Lab Results  Component Value Date   CHOL 227 (H) 04/07/2016   Lab Results  Component Value Date   HDL 66.00 04/07/2016   No results found for: [facility] Lab Results  Component Value Date   TRIG 262.0 (H) 04/07/2016   Lab Results  Component Value Date   CHOLHDL 3 04/07/2016   Lab Results  Component Value Date   HGBA1C 5.5 10/17/2015         Assessment & Plan:   Problem List Items Addressed This Visit    Tobacco use    Encouraged complete cessation. Discussed need to quit as relates to risk of numerous cancers, cardiac and pulmonary disease as well as neurologic complications. Counseled for greater than 3 minutes      Urinary incontinence    UTI noted start Bactrim and report if any concerns      Relevant Orders   CBC with Differential/Platelet (Completed)   Urine culture (Completed)   Urinalysis   Pedal edema    Elevate feet, minimize sodium and compression hose as directed.       Other Visit Diagnoses    Bruising    -  Primary   Relevant Orders   CBC with Differential/Platelet (Completed)      I have discontinued Ms. Mcadoo's prenatal multivitamin, acetaminophen, ferrous sulfate, Cyanocobalamin (VITAMIN B 12 PO), and meloxicam. I am also having her start on naproxen. Additionally, I am having her maintain her saccharomyces boulardii and DULoxetine. We will stop administering sodium chloride.  Meds ordered this encounter  Medications  . DISCONTD: meloxicam (MOBIC) 15 MG tablet    Sig: Take 15 mg by mouth daily.  . naproxen (NAPROSYN) 375 MG tablet    Sig: Take 1 tablet (375 mg total) by mouth 2 (two) times daily with a meal.    Dispense:  60 tablet     Refill:  1    CMA served as scribe during this visit. History, Physical and Plan performed by medical provider. Documentation and orders reviewed and attested to.  Penni Homans, MD

## 2017-02-18 NOTE — Patient Instructions (Signed)
Benefiber powder twice daily  Kegel exercises twice daily, sets of 10 Minimize sodium Elevate feet Compression Walk If no improvement will need to proceed with some imaging and referral to physical therapy for pelvic floor PT Kegel Exercises Kegel exercises help strengthen the muscles that support the rectum, vagina, small intestine, bladder, and uterus. Doing Kegel exercises can help:  Improve bladder and bowel control.  Improve sexual response.  Reduce problems and discomfort during pregnancy.  Kegel exercises involve squeezing your pelvic floor muscles, which are the same muscles you squeeze when you try to stop the flow of urine. The exercises can be done while sitting, standing, or lying down, but it is best to vary your position. Phase 1 exercises 1. Squeeze your pelvic floor muscles tight. You should feel a tight lift in your rectal area. If you are a female, you should also feel a tightness in your vaginal area. Keep your stomach, buttocks, and legs relaxed. 2. Hold the muscles tight and repeat 10 times 3. Relax your muscles. Repeat this exercise 2-3 times a day or as many times as told by your health care provider. Continue to do this exercise for at least 4-6 weeks or for as long as told by your health care provider. This information is not intended to replace advice given to you by your health care provider. Make sure you discuss any questions you have with your health care provider. Document Released: 08/23/2012 Document Revised: 05/01/2016 Document Reviewed: 07/27/2015 Elsevier Interactive Patient Education  [employer].

## 2017-02-20 ENCOUNTER — Encounter: Payer: Self-pay | Admitting: Family Medicine

## 2017-02-20 ENCOUNTER — Other Ambulatory Visit: Payer: Self-pay | Admitting: Family Medicine

## 2017-02-20 DIAGNOSIS — R600 Localized edema: Secondary | ICD-10-CM

## 2017-02-20 HISTORY — DX: Localized edema: R60.0

## 2017-02-20 LAB — URINE CULTURE

## 2017-02-20 MED ORDER — SULFAMETHOXAZOLE-TRIMETHOPRIM 800-160 MG PO TABS: 1.0000 | ORAL_TABLET | Freq: Two times a day (BID) | ORAL | 0 refills | Status: DC

## 2017-02-20 NOTE — Assessment & Plan Note (Signed)
UTI noted start Bactrim and report if any concerns

## 2017-02-20 NOTE — Assessment & Plan Note (Signed)
Encouraged complete cessation. Discussed need to quit as relates to risk of numerous cancers, cardiac and pulmonary disease as well as neurologic complications. Counseled for greater than 3 minutes 

## 2017-02-20 NOTE — Assessment & Plan Note (Signed)
Elevate feet, minimize sodium and compression hose as directed.

## 2017-05-03 ENCOUNTER — Other Ambulatory Visit: Payer: Self-pay | Admitting: Medical

## 2017-07-05 ENCOUNTER — Ambulatory Visit (INDEPENDENT_AMBULATORY_CARE_PROVIDER_SITE_OTHER): Payer: Managed Care, Other (non HMO) | Admitting: Family Medicine

## 2017-07-05 ENCOUNTER — Encounter: Payer: Self-pay | Admitting: Family Medicine

## 2017-07-05 VITALS — BP 150/80 | HR 88 | Temp 97.6°F | Ht 62.0 in | Wt 156.0 lb

## 2017-07-05 DIAGNOSIS — N390 Urinary tract infection, site not specified: Secondary | ICD-10-CM

## 2017-07-05 DIAGNOSIS — R159 Full incontinence of feces: Secondary | ICD-10-CM | POA: Diagnosis not present

## 2017-07-05 LAB — POC URINALSYSI DIPSTICK (AUTOMATED)
BILIRUBIN UA: NEGATIVE
Blood, UA: NEGATIVE
GLUCOSE UA: NEGATIVE
Ketones, UA: NEGATIVE
Leukocytes, UA: NEGATIVE
NITRITE UA: POSITIVE
Protein, UA: NEGATIVE
UROBILINOGEN UA: 0.2 U/dL
pH, UA: 6 (ref 5.0–8.0)

## 2017-07-05 MED ORDER — CIPROFLOXACIN HCL 250 MG PO TABS: 250.0000 mg | ORAL_TABLET | Freq: Two times a day (BID) | ORAL | 0 refills | Status: DC

## 2017-07-05 NOTE — Progress Notes (Signed)
Patient ID: [name], female    DOB: Nov 05, 1960  Age: 56 y.o. MRN: [id number]    Subjective:  Subjective  HPI [name] presents for pain in L groin -- 7/10 in am it woke her up -- she used heat/ cold and took tylenol and advil and now its a 2/10.  No known injury.  This also occurred about 3 years ago when she was in [place] --- ct was done because her pcp then thought it was a hernia but ct was normal.  Pain went away on its own.    Review of Systems  Constitutional: Negative.  Negative for activity change, appetite change and unexpected weight change.  Respiratory: Negative for cough and shortness of breath.   Cardiovascular: Negative for chest pain and palpitations.  Psychiatric/Behavioral: Negative for behavioral problems and dysphoric mood. The patient is not nervous/anxious.     History Past Medical History:  Diagnosis Date  . Anemia   . Arthritis    oa left hip, sees dr Lyla Glassing  . Chronic back pain   . Depression    pt states when her husband passed away. now feels fine.  . Diverticulitis   . Melanoma ([place]) more than 10 yrs ago   Chest   . Pedal edema 02/20/2017  . Tobacco use 02/18/2017  . Urinary incontinence 02/18/2017    She has a past surgical history that includes Melanoma excision; Flexible sigmoidoscopy (N/A, 06/05/2015); Partial colectomy (N/A, 07/15/2015); Back surgery ( 20 yrs ago); Tonsillectomy (as child); Abdominal hysterectomy (1989); and Colostomy takedown (N/A, 10/22/2015).   Her family history includes Cancer in her maternal aunt, maternal uncle, and maternal uncle; Cancer (age of onset: 43) in her maternal grandmother; Colon cancer (age of onset: 73) in her maternal aunt; Colon cancer (age of onset: 9) in her maternal aunt; Colon cancer (age of onset: 16) in her maternal uncle and maternal uncle; Hypertension in her father.She reports that she has been smoking Cigarettes.  She has a 30.00 pack-year smoking history. She has never used smokeless tobacco. She  reports that she drinks alcohol. She reports that she does not use drugs.  Current Outpatient Prescriptions on File Prior to Visit  Medication Sig Dispense Refill  . DULoxetine (CYMBALTA) 60 MG capsule TAKE 1 CAPSULE BY MOUTH DAILY 90 capsule 0  . naproxen (NAPROSYN) 375 MG tablet Take 1 tablet (375 mg total) by mouth 2 (two) times daily with a meal. 60 tablet 1  . saccharomyces boulardii (FLORASTOR) 250 MG capsule Take 1 capsule (250 mg total) by mouth 2 (two) times daily. 60 capsule 0   No current facility-administered medications on file prior to visit.      Objective:  Objective  Physical Exam  Constitutional: She is oriented to person, place, and time. She appears well-developed and well-nourished.  HENT:  Head: Normocephalic and atraumatic.  Eyes: Conjunctivae and EOM are normal.  Neck: Normal range of motion. Neck supple. No JVD present. Carotid bruit is not present. No thyromegaly present.  Cardiovascular: Normal rate, regular rhythm and normal heart sounds.   No murmur heard. Pulmonary/Chest: Effort normal and breath sounds normal. No respiratory distress. She has no wheezes. She has no rales. She exhibits no tenderness.  Musculoskeletal: She exhibits no edema.  Neurological: She is alert and oriented to person, place, and time.  Psychiatric: She has a normal mood and affect.  Nursing note and vitals reviewed.  BP (!) 150/80   Pulse 88   Temp 97.6 F (36.4 C) (Oral)  Ht 5\' 2"  (1.575 m)   Wt 156 lb (70.8 kg)   SpO2 98%   BMI 28.53 kg/m  Wt Readings from Last 3 Encounters:  07/05/17 156 lb (70.8 kg)  02/18/17 146 lb 6.4 oz (66.4 kg)  07/16/16 135 lb (61.2 kg)     Lab Results  Component Value Date   WBC 12.4 (H) 02/18/2017   HGB 12.8 02/18/2017   HCT 37.8 02/18/2017   PLT 315.0 02/18/2017   GLUCOSE 85 04/07/2016   CHOL 227 (H) 04/07/2016   TRIG 262.0 (H) 04/07/2016   HDL 66.00 04/07/2016   LDLDIRECT 127.0 04/07/2016   ALT 14 04/07/2016   AST 18 04/07/2016    NA 136 04/07/2016   K 4.1 04/07/2016   CL 100 04/07/2016   CREATININE 0.62 04/07/2016   BUN 13 04/07/2016   CO2 30 04/07/2016   TSH 1.86 04/07/2016   INR 1.09 06/04/2015   HGBA1C 5.5 10/17/2015    No results found.   Assessment & Plan:  Plan  I have discontinued Ms. Weidemann's sulfamethoxazole-trimethoprim. I am also having her start on ciprofloxacin. Additionally, I am having her maintain her saccharomyces boulardii, naproxen, and DULoxetine.  Meds ordered this encounter  Medications  . ciprofloxacin (CIPRO) 250 MG tablet    Sig: Take 1 tablet (250 mg total) by mouth 2 (two) times daily.    Dispense:  10 tablet    Refill:  0    Problem List Items Addressed This Visit    None    Visit Diagnoses    Encopresis    -  Primary   Relevant Orders   Ambulatory referral to Gastroenterology   Urinary tract infection without hematuria, site unspecified       Relevant Medications   ciprofloxacin (CIPRO) 250 MG tablet   Other Relevant Orders   POCT Urinalysis Dipstick (Automated) (Completed)      Follow-up: Return in about 2 weeks (around 07/19/2017), or if symptoms worsen or fail to improve, for recheck ua -- lab only.  Ann Held, DO

## 2017-07-05 NOTE — Patient Instructions (Signed)

## 2017-07-18 ENCOUNTER — Ambulatory Visit: Payer: Managed Care, Other (non HMO) | Admitting: Physician Assistant

## 2017-07-25 ENCOUNTER — Ambulatory Visit (INDEPENDENT_AMBULATORY_CARE_PROVIDER_SITE_OTHER): Payer: 59 | Admitting: Family

## 2017-07-25 ENCOUNTER — Encounter: Payer: Self-pay | Admitting: Family

## 2017-07-25 VITALS — BP 158/90 | HR 103 | Temp 98.1°F | Resp 16 | Ht 62.0 in | Wt 157.4 lb

## 2017-07-25 DIAGNOSIS — G8929 Other chronic pain: Secondary | ICD-10-CM

## 2017-07-25 DIAGNOSIS — M4807 Spinal stenosis, lumbosacral region: Secondary | ICD-10-CM

## 2017-07-25 DIAGNOSIS — I10 Essential (primary) hypertension: Secondary | ICD-10-CM | POA: Diagnosis not present

## 2017-07-25 DIAGNOSIS — M545 Low back pain: Secondary | ICD-10-CM | POA: Diagnosis not present

## 2017-07-25 MED ORDER — METHYLPREDNISOLONE 4 MG PO TBPK: ORAL_TABLET | ORAL | 0 refills | Status: DC

## 2017-07-25 NOTE — Progress Notes (Signed)
Subjective:    Patient ID: [name], female    DOB: 10-25-1960, 56 y.o.   MRN: [id number]  HPI  [name] is a 56 yr old female who presents today with chief complaint of left groin pain.  Pain has been present x 2 days. Pain is worse with walking. Reports that pain has been intermittent for several years.  Reports that on the weekends when she is more sedentary and has less groin pain. She is scheduled to see GI on 11/13.  Reports that she has encopresis occurred 0 times last week and 4 episodes the week before.  Starts as a sharp shooting pain into the left groin. She reports that she keeps tripping on the rugs in her house so she removed them. Notes that it feels like her left leg may give out. Especially when she is coming down the stair.  Left leg will sometimes feel numb and like it is asleep. Often both feet feel asleep.  She has a history of back surgery 25+ years ago.  +  Review of Systems See HPI  Past Medical History:  Diagnosis Date  . Anemia   . Arthritis    oa left hip, sees dr Lyla Glassing  . Chronic back pain   . Depression    pt states when her husband passed away. now feels fine.  . Diverticulitis   . Melanoma ([place]) more than 10 yrs ago   Chest   . Pedal edema 02/20/2017  . Tobacco use 02/18/2017  . Urinary incontinence 02/18/2017     Social History   Socioeconomic History  . Marital status: Widowed    Spouse name: Not on file  . Number of children: Not on file  . Years of education: Not on file  . Highest education level: Not on file  Social Needs  . Financial resource strain: Not on file  . Food insecurity - worry: Not on file  . Food insecurity - inability: Not on file  . Transportation needs - medical: Not on file  . Transportation needs - non-medical: Not on file  Occupational History  . Not on file  Tobacco Use  . Smoking status: Current Every Day Smoker    Packs/day: 1.00    Years: 30.00    Pack years: 30.00    Types: Cigarettes  . Smokeless  tobacco: Never Used  . Tobacco comment: Pt info given 05-30-2015  Substance and Sexual Activity  . Alcohol use: Yes    Alcohol/week: 0.0 oz    Comment: 2 glasses of wine a day  . Drug use: No  . Sexual activity: No  Other Topics Concern  . Not on file  Social History Narrative  . Not on file    Past Surgical History:  Procedure Laterality Date  . ABDOMINAL HYSTERECTOMY  1989   Ovaries in place  . BACK SURGERY   20 yrs ago   lower  . MELANOMA EXCISION    . TONSILLECTOMY  as child    Family History  Problem Relation Age of Onset  . Hypertension Father   . Cancer Maternal Grandmother 40       breast  . Cancer Maternal Aunt        breast  . Colon cancer Maternal Aunt 41  . Cancer Maternal Uncle        leukemia  . Colon cancer Maternal Uncle 31  . Cancer Maternal Uncle        colon  . Colon cancer Maternal Uncle  60  . Colon cancer Maternal Aunt 40  . Heart disease Neg Hx   . Stroke Neg Hx     Allergies  Allergen Reactions  . Ampicillin Nausea And Vomiting    Has patient had a PCN reaction causing immediate rash, facial/tongue/throat swelling, SOB or lightheadedness with hypotension: No Has patient had a PCN reaction causing severe rash involving mucus membranes or skin necrosis: No Has patient had a PCN reaction that required hospitalization No Has patient had a PCN reaction occurring within the last 10 years: Yes If all of the above answers are "NO", then may proceed with Cephalosporin use.    Current Outpatient Medications on File Prior to Visit  Medication Sig Dispense Refill  . DULoxetine (CYMBALTA) 60 MG capsule TAKE 1 CAPSULE BY MOUTH DAILY 90 capsule 0  . naproxen (NAPROSYN) 375 MG tablet Take 1 tablet (375 mg total) by mouth 2 (two) times daily with a meal. 60 tablet 1  . saccharomyces boulardii (FLORASTOR) 250 MG capsule Take 1 capsule (250 mg total) by mouth 2 (two) times daily. 60 capsule 0   No current facility-administered medications on file prior to  visit.     BP (!) 158/90 (BP Location: Right Arm, Cuff Size: Normal)   Pulse (!) 103   Temp 98.1 F (36.7 C) (Oral)   Resp 16   Ht 5\' 2"  (1.575 m)   Wt 157 lb 6.4 oz (71.4 kg)   SpO2 97%   BMI 28.79 kg/m       Objective:   Physical Exam  Constitutional: She is oriented to person, place, and time. She appears well-developed and well-nourished.  Cardiovascular: Normal rate, regular rhythm and normal heart sounds.  No murmur heard. Pulmonary/Chest: Effort normal and breath sounds normal. No respiratory distress. She has no wheezes.  Musculoskeletal:       Thoracic back: She exhibits no tenderness.       Lumbar back: She exhibits no tenderness.  Neurological: She is alert and oriented to person, place, and time.  Reflex Scores:      Patellar reflexes are 1+ on the right side and 1+ on the left side. Bilateral LE strength is 4-5/5   Psychiatric: She has a normal mood and affect. Her behavior is normal. Judgment and thought content normal.          Assessment & Plan:  Low back pain- radiates down left leg, groin, intermittent numbness/weakness. Having long standing bladder >bowel incontinence.  Will obtain MRI of the lumbar spine to further evaluate as soon as possible. . rx medrol dose pak.   HTN- BP elevated today, ? Due to pain, will have pt follow up in 1 week with PCP for follow up of back pain and repeat bp.

## 2017-07-25 NOTE — Patient Instructions (Signed)
Please begin Medrol dose pak (steroid taper) for your back pain. You will be contacted about your MRI.

## 2017-07-31 ENCOUNTER — Other Ambulatory Visit: Payer: Self-pay | Admitting: Medical

## 2017-08-01 ENCOUNTER — Ambulatory Visit: Payer: 59 | Admitting: Medical

## 2017-08-01 ENCOUNTER — Encounter: Payer: Self-pay | Admitting: Medical

## 2017-08-01 VITALS — BP 158/88 | HR 80 | Temp 98.2°F | Resp 16 | Wt 154.6 lb

## 2017-08-01 DIAGNOSIS — M5126 Other intervertebral disc displacement, lumbar region: Secondary | ICD-10-CM

## 2017-08-01 DIAGNOSIS — M545 Low back pain, unspecified: Secondary | ICD-10-CM

## 2017-08-01 DIAGNOSIS — G8929 Other chronic pain: Secondary | ICD-10-CM | POA: Diagnosis not present

## 2017-08-01 MED ORDER — TRAMADOL HCL 50 MG PO TABS: 50.0000 mg | ORAL_TABLET | Freq: Four times a day (QID) | ORAL | 0 refills | Status: DC | PRN

## 2017-08-01 MED ORDER — AMLODIPINE BESYLATE 5 MG PO TABS: 5.0000 mg | ORAL_TABLET | Freq: Every day | ORAL | 0 refills | Status: DC

## 2017-08-01 MED ORDER — DICLOFENAC SODIUM 75 MG PO TBEC: 75.0000 mg | DELAYED_RELEASE_TABLET | Freq: Two times a day (BID) | ORAL | 0 refills | Status: DC

## 2017-08-01 NOTE — Progress Notes (Signed)
Subjective:    Patient ID: [name], female    DOB: 1961/03/15, 56 y.o.   MRN: [id number]  HPI  Pt in for follow up.  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 Some pain in lower back that will run down to her left leg. On days workin/ on her feet all day will have more radiating pain to legs. Intermittent weakness of her left leg with short and long distance walks. She states will occur even just walking around her house.   Some bulging disk but no reported impingement on xray.  She does report some intermittent numbness. She has some urinary incontinence. She wears depends.  Some pain in past in left groin area. But pain is current.  Pt has numbness to feet and legs both sides on walking. Intermittent and not constant.  Pt got depomedrol dose pack but did not help much.  She has left groin pain that is more severe.(no pain on palpation).  Pt bp high last time and today. She attributes this to pain.  See last visit.   Review of Systems  Constitutional: Negative for chills, fatigue and fever.  Respiratory: Negative for cough, chest tightness, shortness of breath and wheezing.   Cardiovascular: Negative for chest pain and palpitations.  Gastrointestinal: Negative for abdominal distention, abdominal pain, anal bleeding, constipation, diarrhea and vomiting.  Endocrine: Negative for polydipsia, polyphagia and polyuria.  Genitourinary:       See hpi.  Musculoskeletal: Positive for back pain. Negative for gait problem, myalgias and neck stiffness.  Skin: Negative for rash.  Neurological:       See hpi.  Hematological: Negative for adenopathy. Does not bruise/bleed easily.  Psychiatric/Behavioral: Negative for behavioral problems, confusion, self-injury and suicidal ideas. The patient is not nervous/anxious.    Past Medical History:  Diagnosis Date  . Anemia   . Arthritis    oa left hip, sees dr Lyla Glassing  . Chronic back pain   . Depression     pt states when her husband passed away. now feels fine.  . Diverticulitis   . Melanoma ([place]) more than 10 yrs ago   Chest   . Pedal edema 02/20/2017  . Tobacco use 02/18/2017  . Urinary incontinence 02/18/2017     Social History   Socioeconomic History  . Marital status: Widowed    Spouse name: Not on file  . Number of children: Not on file  . Years of education: Not on file  . Highest education level: Not on file  Social Needs  . Financial resource strain: Not on file  . Food insecurity - worry: Not on file  . Food insecurity - inability: Not on file  . Transportation needs - medical: Not on file  . Transportation needs - non-medical: Not on file  Occupational History  . Not on file  Tobacco Use  . Smoking status: Current Every Day Smoker    Packs/day: 1.00    Years: 30.00    Pack years: 30.00    Types: Cigarettes  . Smokeless tobacco: Never Used  . Tobacco comment: Pt info given 05-30-2015  Substance and Sexual Activity  . Alcohol use: Yes    Alcohol/week: 0.0 oz    Comment: 2 glasses of wine a day  . Drug use: No  . Sexual activity: No  Other Topics Concern  . Not on file  Social History Narrative  . Not on file    Past Surgical History:  Procedure  Laterality Date  . ABDOMINAL HYSTERECTOMY  1989   Ovaries in place  . BACK SURGERY   20 yrs ago   lower  . MELANOMA EXCISION    . TONSILLECTOMY  as child    Family History  Problem Relation Age of Onset  . Hypertension Father   . Cancer Maternal Grandmother 40       breast  . Cancer Maternal Aunt        breast  . Colon cancer Maternal Aunt 65  . Cancer Maternal Uncle        leukemia  . Colon cancer Maternal Uncle 29  . Cancer Maternal Uncle        colon  . Colon cancer Maternal Uncle 68  . Colon cancer Maternal Aunt 37  . Heart disease Neg Hx   . Stroke Neg Hx     Allergies  Allergen Reactions  . Ampicillin Nausea And Vomiting    Has patient had a PCN reaction causing immediate rash,  facial/tongue/throat swelling, SOB or lightheadedness with hypotension: No Has patient had a PCN reaction causing severe rash involving mucus membranes or skin necrosis: No Has patient had a PCN reaction that required hospitalization No Has patient had a PCN reaction occurring within the last 10 years: Yes If all of the above answers are "NO", then may proceed with Cephalosporin use.    Current Outpatient Medications on File Prior to Visit  Medication Sig Dispense Refill  . DULoxetine (CYMBALTA) 60 MG capsule TAKE 1 CAPSULE BY MOUTH DAILY 90 capsule 0  . methylPREDNISolone (MEDROL DOSEPAK) 4 MG TBPK tablet Take as directed 21 tablet 0  . naproxen (NAPROSYN) 375 MG tablet Take 1 tablet (375 mg total) by mouth 2 (two) times daily with a meal. 60 tablet 1  . saccharomyces boulardii (FLORASTOR) 250 MG capsule Take 1 capsule (250 mg total) by mouth 2 (two) times daily. 60 capsule 0   No current facility-administered medications on file prior to visit.     BP (!) 158/88   Pulse 80   Temp 98.2 F (36.8 C) (Oral)   Resp 16   Wt 154 lb 9.6 oz (70.1 kg)   SpO2 96%   BMI 28.28 kg/m       Objective:   Physical Exam  General Appearance- Not in acute distress.    Chest and Lung Exam Auscultation: Breath sounds:-Normal. Clear even and unlabored. Adventitious sounds:- No Adventitious sounds.  Cardiovascular Auscultation:Rythm - Regular, rate and rythm. Heart Sounds -Normal heart sounds.  Abdomen Inspection:-Inspection Normal.  Palpation/Perucssion: Palpation and Percussion of the abdomen reveal- Non Tender, No Rebound tenderness, No rigidity(Guarding) and No Palpable abdominal masses.  Liver:-Normal.  Spleen:- Normal.   Back Mid lumbar spine tenderness to palpation.(and left SI) Pain on straight leg lift. Pain on lateral movements and flexion/extension of the spine.  Lower ext neurologic  L5-S1 sensation intact bilaterally. Normal patellar reflexes bilaterally. No foot drop  bilaterally.      Assessment & Plan:  For your recent back pain with radiating pain and some positive findings on MRI, I will refer you to Dr. Ellene Route neurosurgeon.  If your signs and symptoms worsen as discussed/red flag type symptoms then be seen at the emergency department.  Also give me an update if such symptoms are occurring.  Will prescribe diclofenac for moderate pain.  For more severe pain will prescribe tramadol.  Please review the education sheet on serotonin syndrome.  This is very rare but I do want you to be  aware if the symptoms were to occur.(stop med both cymbalta and tramadol if were to occur and ED evaluation)  For moderate high blood pressure today and on last visit, I want you to check your blood pressure at home.  If your blood pressure is exceeding 140/90 then I want you to start amlodipine.  I provided you with a printed prescription today.  If you do start this medication please let me know.  Follow-up in 2-3 weeks or as needed.

## 2017-08-01 NOTE — Patient Instructions (Addendum)
For your recent back pain with radiating pain and some positive findings on MRI, I will refer you to Dr. Ellene Route neurosurgeon.  If your signs and symptoms worsen as discussed/red flag type symptoms then be seen at the emergency department.  Also give me an update if such symptoms are occurring.  Will prescribe diclofenac for moderate pain.  For more severe pain will prescribe tramadol.  Please review the education sheet on serotonin syndrome.  This is very rare but I do want you to be aware if the symptoms were to occur.(stop med both cymbalta and tramadol if were to occur and ED evaluation)  For moderate high blood pressure today and on last visit, I want you to check your blood pressure at home.  If your blood pressure is exceeding 140/90 then I want you to start amlodipine.  I provided you with a printed prescription today.  If you do start this medication please let me know.  Follow-up in 2-3 weeks or as needed.  Serotonin Syndrome Serotonin is a brain chemical that regulates the nervous system, which includes the brain, spinal cord, and nerves. Serotonin appears to play a role in all types of behavior, including appetite, emotions, movement, thinking, and response to stress. Excessively high levels of serotonin in the body can cause serotonin syndrome, which is a very dangerous condition. What are the causes? This condition can be caused by taking medicines or drugs that increase the level of serotonin in your body. These include:  Antidepressant medicines.  Migraine medicines.  Certain pain medicines.  Certain recreational drugs, including ecstasy, LSD, cocaine, and amphetamines.  Over-the-counter cough or cold medicines that contain dextromethorphan.  Certain herbal supplements, including St. John's wort, ginseng, and nutmeg.  This condition usually occurs when you take these medicines or drugs in combination, but it can also happen with a high dose of a single medicine or drug. What  increases the risk? This condition is more likely to develop in:  People who have recently increased the dosage of medicine that increases the serotonin level.  People who just started taking medicine that increases the serotonin level.  What are the signs or symptoms? Symptoms of this condition usually happens within several hours of a medicine change. Symptoms include:  Headache.  Muscle twitching or stiffness.  Diarrhea.  Confusion.  Restlessness or agitation.  Shivering or goose bumps.  Loss of muscle coordination.  Rapid heart rate.  Sweating.  Severe cases of serotonin syndromecan cause:  Irregular heartbeat.  Seizures.  Loss of consciousness.  High fever.  How is this diagnosed? This condition is diagnosed with a medical history and physical exam. You will be asked aboutyour symptoms and your use of medicines and recreational drugs. Your health care provider may also order lab work or additional tests to rule out other causes of your symptoms. How is this treated? The treatment for this condition depends on the severity of your symptoms. For mild cases, stopping the medicine that caused your condition is usually all that is needed. For moderate to severe cases, hospitalization is required to monitor you and to prevent further muscle damage. Follow these instructions at home:  Take over-the-counter and prescription medicines only as told by your health care provider. This is important.  Check with your health care provider before you start taking any new prescriptions, over-the-counter medicines, herbs, or supplements.  Avoid combining any medicines that can cause this condition to occur.  Keep all follow-up visits as told by your health care provider.This  is important.  Maintain a healthy lifestyle. ? Eat healthy foods. ? Get plenty of sleep. ? Exercise regularly. ? Do not drink alcohol. ? Do not use recreational drugs. Contact a health care  provider if:  Medicines do not seem to be helping.  Your symptoms do not improve or they get worse.  You have trouble taking care of yourself. Get help right away if:  You have worsening confusion, severe headache, chest pain, high fever, seizures, or loss of consciousness.  You have serious thoughts about hurting yourself or others.  You experience serious side effects of medicine, such as swelling of your face, lips, tongue, or throat. This information is not intended to replace advice given to you by your health care provider. Make sure you discuss any questions you have with your health care provider. Document Released: 10/14/2004 Document Revised: 05/01/2016 Document Reviewed: 09/19/2014 Elsevier Interactive Patient Education  [employer].

## 2017-08-02 ENCOUNTER — Ambulatory Visit: Payer: 59 | Admitting: Physician Assistant

## 2017-08-08 ENCOUNTER — Encounter: Payer: Self-pay | Admitting: Family Medicine

## 2017-08-17 ENCOUNTER — Encounter: Payer: Self-pay | Admitting: Medical

## 2017-08-18 ENCOUNTER — Telehealth: Payer: Self-pay | Admitting: Medical

## 2017-08-18 NOTE — Telephone Encounter (Signed)
Notified pt to come pick form up

## 2017-08-18 NOTE — Telephone Encounter (Signed)
I would like a copy of my DNR order. I want to put it in my wallet and post it in my home so there are no questions about my requests and my desires are fullfilled. I have copies of my [place] paperwork but understand that these may not be honored if I do not have an official DNR order from my doctor.  Can you find this in epic and make copies so she can pick up?  thanks

## 2017-10-27 ENCOUNTER — Other Ambulatory Visit: Payer: Self-pay | Admitting: Medical

## 2017-10-27 NOTE — Telephone Encounter (Signed)
Called pt and LVM  Advising to call back and make a follow up appt before any further refills could be authorized.

## 2017-10-27 NOTE — Telephone Encounter (Signed)
Pt is due for follow up please call and schedule appointment.  

## 2017-11-17 ENCOUNTER — Other Ambulatory Visit: Payer: Self-pay | Admitting: Medical

## 2017-11-18 NOTE — Telephone Encounter (Signed)
Pt is requesting refill on tramadol.   Last OV: 08/01/2017  Last Fill: 08/01/2017 #20 and 0RF UDS: None   Please advise.

## 2017-11-19 MED ORDER — TRAMADOL HCL 50 MG PO TABS: 50.0000 mg | ORAL_TABLET | Freq: Four times a day (QID) | ORAL | 0 refills | Status: DC | PRN

## 2017-11-19 NOTE — Telephone Encounter (Signed)
Pt not on contract. I did refill brief acute 3 day prescription of tramadol this weekend. Notify pt per [place] law if needs more meds need appointment. Would recommend follow up any way based on her severe symptoms in the past.

## 2017-11-24 ENCOUNTER — Encounter: Payer: Self-pay | Admitting: Medical

## 2017-11-24 ENCOUNTER — Ambulatory Visit (INDEPENDENT_AMBULATORY_CARE_PROVIDER_SITE_OTHER): Payer: 59 | Admitting: Medical

## 2017-11-24 VITALS — BP 150/80 | HR 106 | Temp 98.1°F | Resp 16 | Ht 62.0 in | Wt 149.6 lb

## 2017-11-24 DIAGNOSIS — Z1231 Encounter for screening mammogram for malignant neoplasm of breast: Secondary | ICD-10-CM | POA: Diagnosis not present

## 2017-11-24 DIAGNOSIS — Z1239 Encounter for other screening for malignant neoplasm of breast: Secondary | ICD-10-CM

## 2017-11-24 DIAGNOSIS — R269 Unspecified abnormalities of gait and mobility: Secondary | ICD-10-CM

## 2017-11-24 DIAGNOSIS — Z0000 Encounter for general adult medical examination without abnormal findings: Secondary | ICD-10-CM

## 2017-11-24 DIAGNOSIS — Z1159 Encounter for screening for other viral diseases: Secondary | ICD-10-CM

## 2017-11-24 DIAGNOSIS — R5383 Other fatigue: Secondary | ICD-10-CM

## 2017-11-24 DIAGNOSIS — R251 Tremor, unspecified: Secondary | ICD-10-CM

## 2017-11-24 NOTE — Progress Notes (Signed)
Subjective:    Patient ID: [name], female    DOB: July 10, 1961, 57 y.o.   MRN: [id number]  HPI  Pt in for cpe. She is not fasting.  Pt did not get flu vaccine. Pt declines vaccine today.  Pt is due for mammogram. Last test result was normal.  Pt had history of hysterectomy. Last pap was 2 years. Told by gyn pap not necessary per pt. Pt still has ovaries.   Pt is ok with getting hep c screening.   Pt bp has been high. I advised her to start bp medication. She agrees now will take. She agrees will take norvasc now.     Review of Systems  Constitutional: Negative for chills, fatigue and fever.  HENT: Negative for congestion, drooling, ear pain, hearing loss and mouth sores.   Respiratory: Negative for apnea, cough, choking, shortness of breath and wheezing.   Cardiovascular: Negative for chest pain and palpitations.  Gastrointestinal: Negative for abdominal distention, abdominal pain, blood in stool, constipation, diarrhea and vomiting.  Musculoskeletal: Negative for back pain.  Skin: Negative for rash.  Neurological: Negative for dizziness, syncope, weakness, light-headedness and numbness.       Some ambulation issues. She states feels episodes of incordination. Sometimes feels like it gives out sprodically Also feels occasional rt hand shakes/tremors. Shuffled gait at times couple of years.    Hematological: Negative for adenopathy. Does not bruise/bleed easily.  Psychiatric/Behavioral: Negative for agitation, behavioral problems, decreased concentration, dysphoric mood and hallucinations. The patient is not nervous/anxious.     Past Medical History:  Diagnosis Date  . Anemia   . Arthritis    oa left hip, sees dr Lyla Glassing  . Chronic back pain   . Depression    pt states when her husband passed away. now feels fine.  . Diverticulitis   . Melanoma ([place]) more than 10 yrs ago   Chest   . Pedal edema 02/20/2017  . Tobacco use 02/18/2017  . Urinary incontinence 02/18/2017       Social History   Socioeconomic History  . Marital status: Widowed    Spouse name: Not on file  . Number of children: Not on file  . Years of education: Not on file  . Highest education level: Not on file  Social Needs  . Financial resource strain: Not on file  . Food insecurity - worry: Not on file  . Food insecurity - inability: Not on file  . Transportation needs - medical: Not on file  . Transportation needs - non-medical: Not on file  Occupational History  . Not on file  Tobacco Use  . Smoking status: Current Every Day Smoker    Packs/day: 1.00    Years: 30.00    Pack years: 30.00    Types: Cigarettes  . Smokeless tobacco: Never Used  . Tobacco comment: Pt info given 05-30-2015  Substance and Sexual Activity  . Alcohol use: Yes    Alcohol/week: 0.0 oz    Comment: 2 glasses of wine a day  . Drug use: No  . Sexual activity: No  Other Topics Concern  . Not on file  Social History Narrative  . Not on file    Past Surgical History:  Procedure Laterality Date  . ABDOMINAL HYSTERECTOMY  1989   Ovaries in place  . BACK SURGERY   20 yrs ago   lower  . COLOSTOMY TAKEDOWN N/A 10/22/2015   Procedure: LAPAROSCOPY WITH INTEROLYSIS, CLOSURE OF HARTMANS POUCH, REMOVAL OF LEFT OVARIAN CYST;  Surgeon: Johnathan Hausen, MD;  Location: [facility];  Service: General;  Laterality: N/A;  . FLEXIBLE SIGMOIDOSCOPY N/A 06/05/2015   Procedure: Beryle Quant;  Surgeon: Manus Gunning, MD;  Location: [facility];  Service: Gastroenterology;  Laterality: N/A;  . MELANOMA EXCISION    . PARTIAL COLECTOMY N/A 07/15/2015   Procedure: LAPAROSCOPIC ASSISTED SIGMOID COLECTOMY WITH COLOSTOMY, HARTMANN PROCEDURE, WITH PLACEMENT OF WOUND VAC;  Surgeon: Johnathan Hausen, MD;  Location: [facility];  Service: General;  Laterality: N/A;  . TONSILLECTOMY  as child    Family History  Problem Relation Age of Onset  . Hypertension Father   . Cancer Maternal Grandmother 40       breast  . Cancer  Maternal Aunt        breast  . Colon cancer Maternal Aunt 61  . Cancer Maternal Uncle        leukemia  . Colon cancer Maternal Uncle 8  . Cancer Maternal Uncle        colon  . Colon cancer Maternal Uncle 71  . Colon cancer Maternal Aunt 46  . Heart disease Neg Hx   . Stroke Neg Hx     Allergies  Allergen Reactions  . Ampicillin Nausea And Vomiting    Has patient had a PCN reaction causing immediate rash, facial/tongue/throat swelling, SOB or lightheadedness with hypotension: No Has patient had a PCN reaction causing severe rash involving mucus membranes or skin necrosis: No Has patient had a PCN reaction that required hospitalization No Has patient had a PCN reaction occurring within the last 10 years: Yes If all of the above answers are "NO", then may proceed with Cephalosporin use.    Current Outpatient Medications on File Prior to Visit  Medication Sig Dispense Refill  . DULoxetine (CYMBALTA) 60 MG capsule TAKE 1 CAPSULE BY MOUTH DAILY 90 capsule 0  . traMADol (ULTRAM) 50 MG tablet Take 1 tablet (50 mg total) by mouth every 6 (six) hours as needed. 12 tablet 0  . amLODipine (NORVASC) 5 MG tablet Take 1 tablet (5 mg total) daily by mouth. (Patient not taking: Reported on 11/24/2017) 30 tablet 0   No current facility-administered medications on file prior to visit.     BP (!) 162/85   Pulse (!) 106   Temp 98.1 F (36.7 C) (Oral)   Resp 16   Ht 5\' 2"  (1.575 m)   Wt 149 lb 9.6 oz (67.9 kg)   SpO2 99%   BMI 27.36 kg/m       Objective:   Physical Exam   General Mental Status- Alert. General Appearance- Not in acute distress.   Skin General: Color- Normal Color. Moisture- Normal Moisture.  On inspection of patient's back she has no worrisome skin lesions.  Small scattered low and small freckles.  Neck Carotid Arteries- Normal color. Moisture- Normal Moisture. No carotid bruits. No JVD.  Chest and Lung Exam Auscultation: Breath  Sounds:-Normal.  Cardiovascular Auscultation:Rythm- Regular. Murmurs & Other Heart Sounds:Auscultation of the heart reveals- No Murmurs.  Abdomen Inspection:-Inspeection Normal. Palpation/Percussion:Note:No mass. Palpation and Percussion of the abdomen reveal- Non Tender, Non Distended + BS, no rebound or guarding.    Neurologic Cranial Nerve exam:- CN III-XII intact(No nystagmus), symmetric smile. Drift Test:- No drift. Romberg Exam:- Negative.  Heal to Toe Gait exam:-poor heel to toe. Finger to Nose:- Normal/Intact Strength:- 5/5 equal and symmetric strength both upper and lower extremities. On inspection patient has bilateral mild tremors of hands.      Assessment &  Plan:  For you wellness exam today I have ordered cbc, cmp, tsh, lipid panel, ua and hep c antibody.(future labs)  Vaccine not given. Pt declined flu vaccine.  Recommend exercise and healthy diet.  We will let you know lab results as they come in.  Follow up date appointment will be determined after lab review.   Will refer to neurologist. If symptoms we discussed get worse before referral then be seen her. If dramatic change then ED evaluation.  Please start amlodipine bp medication. Very important.  Mackie Pai, PA-C

## 2017-11-24 NOTE — Patient Instructions (Addendum)
For you wellness exam today I have ordered cbc, cmp, tsh, lipid panel, ua and hep c antibody.(future labs)  Vaccine not given. Pt declined flu vaccine.  Recommend exercise and healthy diet.  We will let you know lab results as they come in.  Follow up date appointment will be determined after lab review.   Will refer to neurologist. If symptoms we discussed get worse before referral then be seen her. If dramatic change then ED evaluation.  Please start amlodipine bp medication. Very important.   Preventive Care 40-64 Years, Female Preventive care refers to lifestyle choices and visits with your health care provider that can promote health and wellness. What does preventive care include?  A yearly physical exam. This is also called an annual well check.  Dental exams once or twice a year.  Routine eye exams. Ask your health care provider how often you should have your eyes checked.  Personal lifestyle choices, including: ? Daily care of your teeth and gums. ? Regular physical activity. ? Eating a healthy diet. ? Avoiding tobacco and drug use. ? Limiting alcohol use. ? Practicing safe sex. ? Taking low-dose aspirin daily starting at age 32. ? Taking vitamin and mineral supplements as recommended by your health care provider. What happens during an annual well check? The services and screenings done by your health care provider during your annual well check will depend on your age, overall health, lifestyle risk factors, and family history of disease. Counseling Your health care provider may ask you questions about your:  Alcohol use.  Tobacco use.  Drug use.  Emotional well-being.  Home and relationship well-being.  Sexual activity.  Eating habits.  Work and work Statistician.  Method of birth control.  Menstrual cycle.  Pregnancy history.  Screening You may have the following tests or measurements:  Height, weight, and BMI.  Blood pressure.  Lipid and  cholesterol levels. These may be checked every 5 years, or more frequently if you are over 57 years old.  Skin check.  Lung cancer screening. You may have this screening every year starting at age 69 if you have a 30-pack-year history of smoking and currently smoke or have quit within the past 15 years.  Fecal occult blood test (FOBT) of the stool. You may have this test every year starting at age 64.  Flexible sigmoidoscopy or colonoscopy. You may have a sigmoidoscopy every 5 years or a colonoscopy every 10 years starting at age 30.  Hepatitis C blood test.  Hepatitis B blood test.  Sexually transmitted disease (STD) testing.  Diabetes screening. This is done by checking your blood sugar (glucose) after you have not eaten for a while (fasting). You may have this done every 1-3 years.  Mammogram. This may be done every 1-2 years. Talk to your health care provider about when you should start having regular mammograms. This may depend on whether you have a family history of breast cancer.  BRCA-related cancer screening. This may be done if you have a family history of breast, ovarian, tubal, or peritoneal cancers.  Pelvic exam and Pap test. This may be done every 3 years starting at age 39. Starting at age 68, this may be done every 5 years if you have a Pap test in combination with an HPV test.  Bone density scan. This is done to screen for osteoporosis. You may have this scan if you are at high risk for osteoporosis.  Discuss your test results, treatment options, and if necessary, the  need for more tests with your health care provider. Vaccines Your health care provider may recommend certain vaccines, such as:  Influenza vaccine. This is recommended every year.  Tetanus, diphtheria, and acellular pertussis (Tdap, Td) vaccine. You may need a Td booster every 10 years.  Varicella vaccine. You may need this if you have not been vaccinated.  Zoster vaccine. You may need this after age  55.  Measles, mumps, and rubella (MMR) vaccine. You may need at least one dose of MMR if you were born in 1957 or later. You may also need a second dose.  Pneumococcal 13-valent conjugate (PCV13) vaccine. You may need this if you have certain conditions and were not previously vaccinated.  Pneumococcal polysaccharide (PPSV23) vaccine. You may need one or two doses if you smoke cigarettes or if you have certain conditions.  Meningococcal vaccine. You may need this if you have certain conditions.  Hepatitis A vaccine. You may need this if you have certain conditions or if you travel or work in places where you may be exposed to hepatitis A.  Hepatitis B vaccine. You may need this if you have certain conditions or if you travel or work in places where you may be exposed to hepatitis B.  Haemophilus influenzae type b (Hib) vaccine. You may need this if you have certain conditions.  Talk to your health care provider about which screenings and vaccines you need and how often you need them. This information is not intended to replace advice given to you by your health care provider. Make sure you discuss any questions you have with your health care provider. Document Released: 10/03/2015 Document Revised: 05/26/2016 Document Reviewed: 07/08/2015 Elsevier Interactive Patient Education  [employer].

## 2017-11-25 ENCOUNTER — Encounter: Payer: Self-pay | Admitting: Neurology

## 2017-11-25 ENCOUNTER — Other Ambulatory Visit (INDEPENDENT_AMBULATORY_CARE_PROVIDER_SITE_OTHER): Payer: 59

## 2017-11-25 DIAGNOSIS — Z0000 Encounter for general adult medical examination without abnormal findings: Secondary | ICD-10-CM

## 2017-11-25 DIAGNOSIS — R5383 Other fatigue: Secondary | ICD-10-CM

## 2017-11-25 DIAGNOSIS — Z1159 Encounter for screening for other viral diseases: Secondary | ICD-10-CM | POA: Diagnosis not present

## 2017-11-25 LAB — URINALYSIS, ROUTINE W REFLEX MICROSCOPIC
BILIRUBIN URINE: NEGATIVE
HGB URINE DIPSTICK: NEGATIVE
KETONES UR: NEGATIVE
LEUKOCYTES UA: NEGATIVE
NITRITE: POSITIVE — AB
RBC / HPF: NONE SEEN (ref 0–?)
Total Protein, Urine: NEGATIVE
URINE GLUCOSE: NEGATIVE
UROBILINOGEN UA: 0.2 (ref 0.0–1.0)
pH: 6 (ref 5.0–8.0)

## 2017-11-25 LAB — CBC WITH DIFFERENTIAL/PLATELET
BASOS PCT: 0.9 % (ref 0.0–3.0)
Basophils Absolute: 0.1 10*3/uL (ref 0.0–0.1)
EOS ABS: 0.1 10*3/uL (ref 0.0–0.7)
Eosinophils Relative: 1.2 % (ref 0.0–5.0)
HCT: 41 % (ref 36.0–46.0)
HEMOGLOBIN: 13.8 g/dL (ref 12.0–15.0)
Lymphocytes Relative: 29.1 % (ref 12.0–46.0)
Lymphs Abs: 2.2 10*3/uL (ref 0.7–4.0)
MCHC: 33.7 g/dL (ref 30.0–36.0)
MCV: 100.2 fl — ABNORMAL HIGH (ref 78.0–100.0)
MONO ABS: 0.6 10*3/uL (ref 0.1–1.0)
Monocytes Relative: 7.6 % (ref 3.0–12.0)
Neutro Abs: 4.6 10*3/uL (ref 1.4–7.7)
Neutrophils Relative %: 61.2 % (ref 43.0–77.0)
Platelets: 326 10*3/uL (ref 150.0–400.0)
RBC: 4.09 Mil/uL (ref 3.87–5.11)
RDW: 13.9 % (ref 11.5–15.5)
WBC: 7.6 10*3/uL (ref 4.0–10.5)

## 2017-11-25 LAB — COMPREHENSIVE METABOLIC PANEL
ALBUMIN: 4.2 g/dL (ref 3.5–5.2)
ALT: 23 U/L (ref 0–35)
AST: 21 U/L (ref 0–37)
Alkaline Phosphatase: 61 U/L (ref 39–117)
BILIRUBIN TOTAL: 0.3 mg/dL (ref 0.2–1.2)
BUN: 14 mg/dL (ref 6–23)
CHLORIDE: 104 meq/L (ref 96–112)
CO2: 25 mEq/L (ref 19–32)
CREATININE: 0.7 mg/dL (ref 0.40–1.20)
Calcium: 9.6 mg/dL (ref 8.4–10.5)
GFR: 91.11 mL/min (ref >60.00)
Glucose, Bld: 87 mg/dL (ref 70–99)
Potassium: 4.2 mEq/L (ref 3.5–5.1)
SODIUM: 140 meq/L (ref 135–145)
Total Protein: 6.8 g/dL (ref 6.0–8.3)

## 2017-11-25 LAB — LIPID PANEL
CHOL/HDL RATIO: 3
CHOLESTEROL: 280 mg/dL — AB (ref 0–200)
HDL: 88.7 mg/dL (ref >39.00)
NonHDL: 191.13
Triglycerides: 317 mg/dL — ABNORMAL HIGH (ref 0.0–149.0)
VLDL: 63.4 mg/dL — ABNORMAL HIGH (ref 0.0–40.0)

## 2017-11-25 LAB — LDL CHOLESTEROL, DIRECT: Direct LDL: 145 mg/dL

## 2017-11-25 LAB — TSH: TSH: 1.79 u[IU]/mL (ref 0.35–4.50)

## 2017-11-26 ENCOUNTER — Encounter: Payer: Self-pay | Admitting: Medical

## 2017-11-26 ENCOUNTER — Telehealth: Payer: Self-pay | Admitting: Medical

## 2017-11-26 DIAGNOSIS — R82998 Other abnormal findings in urine: Secondary | ICD-10-CM

## 2017-11-26 LAB — HEPATITIS C ANTIBODY
HEP C AB: NONREACTIVE
SIGNAL TO CUT-OFF: 0.01 (ref <1.00)

## 2017-11-26 MED ORDER — ATORVASTATIN CALCIUM 10 MG PO TABS
10.0000 mg | ORAL_TABLET | Freq: Every day | ORAL | 0 refills | Status: DC
Start: 2017-11-26 — End: 2018-02-21

## 2017-11-26 NOTE — Telephone Encounter (Signed)
Future urine culture and atrovastatin rx placed.

## 2017-11-28 ENCOUNTER — Ambulatory Visit (HOSPITAL_BASED_OUTPATIENT_CLINIC_OR_DEPARTMENT_OTHER)
Admission: RE | Admit: 2017-11-28 | Discharge: 2017-11-28 | Disposition: A | Discharge status: Home / Self Care | Payer: 59 | Source: Ambulatory Visit | Attending: Medical | Admitting: Medical

## 2017-11-28 DIAGNOSIS — Z1231 Encounter for screening mammogram for malignant neoplasm of breast: Secondary | ICD-10-CM | POA: Insufficient documentation

## 2017-11-28 DIAGNOSIS — Z1239 Encounter for other screening for malignant neoplasm of breast: Secondary | ICD-10-CM

## 2017-11-28 MED ORDER — AMLODIPINE BESYLATE 5 MG PO TABS: 5.0000 mg | ORAL_TABLET | Freq: Every day | ORAL | 0 refills | Status: DC

## 2017-11-30 ENCOUNTER — Other Ambulatory Visit (INDEPENDENT_AMBULATORY_CARE_PROVIDER_SITE_OTHER): Payer: 59

## 2017-11-30 DIAGNOSIS — R82998 Other abnormal findings in urine: Secondary | ICD-10-CM

## 2017-12-02 ENCOUNTER — Telehealth: Payer: Self-pay | Admitting: Medical

## 2017-12-02 LAB — URINE CULTURE
MICRO NUMBER: 90319421
SPECIMEN QUALITY: ADEQUATE

## 2017-12-02 MED ORDER — NITROFURANTOIN MONOHYD MACRO 100 MG PO CAPS: 100.0000 mg | ORAL_CAPSULE | Freq: Two times a day (BID) | ORAL | 0 refills | Status: DC

## 2017-12-02 NOTE — Telephone Encounter (Signed)
Prescription of Macrobid sent to patient's pharmacy

## 2017-12-06 NOTE — Progress Notes (Addendum)
Subjective:   [name] was seen in consultation in the movement disorder clinic at the request of Saguier, Percell Miller, [place].  The evaluation is for tremor and intermittent balance/shuffling issues.  Pt c/o these sx's during yearly PE and was sent for referral.  She brings a long 11 page typed list of history and complaints with her.  Part of what she brings is a diary (virtually every 15 min to 30 min throughout the day is documented).  The records that were made available to me were reviewed. She reports that she saw Dr. Erling Cruz years ago (sounds like for neuropathic pain) and was given cymbalta and lyrica.  She was also seen at [facility] neurology as well and per patient record, she "could not find anything wrong with me."  Tremor started approximately 3 months ago and involves the right hand.  Tremor is most noticeable when trying to write and eat soup.   There is no family hx of tremor.    Affected by caffeine:  No. (2 cups coffee in the AM) Affected by alcohol:  Yes.   (drinks bourbon nightly) Affected by stress:  "stress is my whole day every day at work" - may make shake worse Affected by fatigue:  unknown Spills soup if on spoon:  Most of the time, she can eat the soup without trouble but sometimes spills Spills glass of liquid if full:  Probably would but does not do this Affects ADL's (tying shoes, brushing teeth, etc):  No. but has to sit down to put on shoes and socks due to balance issues.    Current/Previously tried tremor medications: None  Current medications that may exacerbate tremor: None  Outside reports reviewed: historical medical records, lab reports, office notes and referral letter/letters.   -As above, reviewed through the list of things that the patient brought with her to the visit.  She reports that she only takes a shower and washes hair on Sundays.  She no longer uses shampoo so that she can get out of the shower sooner.  She does not take baths because she does not  want to clean the bath.  She has itching.  She stopped walking her dog sometime last year.  She reports it was too much effort.  She has a re-usable/washable pee pad so she doesn't have to expend energy to take the dog out.  She states that she no longer has the energy or desire to clean her house.  She will leave dishes in the dishwasher for  weeks and reports that "they stay there while new dirty dishes in the sink until I am tired of looking at the mess I have allowed."  She does report that she worries she is going to fall.  Her boss has noticed her tremor when she stands in the legs and offered her a seat.  Reports that she has difficulty with concentration, memory, focus and is having difficulty at work.  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 Azzie Roup) she was told that she did not have multiple sclerosis.  She was told her head MRI was unremarkable.  She had nerve conduction studies and they were unremarkable.  She has had swallowing studies in 2012 and 2013 and she reports they were normal.  Allergies  Allergen Reactions  . Ampicillin Nausea And Vomiting    Has  patient had a PCN reaction causing immediate rash, facial/tongue/throat swelling, SOB or lightheadedness with hypotension: No Has patient had a PCN reaction causing severe rash involving mucus membranes or skin necrosis: No Has patient had a PCN reaction that required hospitalization No Has patient had a PCN reaction occurring within the last 10 years: Yes If all of the above answers are "NO", then may proceed with Cephalosporin use.    Outpatient Encounter Medications as of 12/08/2017  Medication Sig  . amLODipine (NORVASC) 5 MG tablet Take 1 tablet (5 mg total) by mouth daily.  Marland Kitchen atorvastatin (LIPITOR) 10 MG tablet Take 1 tablet (10 mg total) by mouth daily.  . DULoxetine  (CYMBALTA) 60 MG capsule TAKE 1 CAPSULE BY MOUTH DAILY  . nitrofurantoin, macrocrystal-monohydrate, (MACROBID) 100 MG capsule Take 1 capsule (100 mg total) by mouth 2 (two) times daily.  . traMADol (ULTRAM) 50 MG tablet Take 1 tablet (50 mg total) by mouth every 6 (six) hours as needed.   No facility-administered encounter medications on file as of 12/08/2017.     Past Medical History:  Diagnosis Date  . Anemia   . Arthritis    oa left hip, sees dr Lyla Glassing  . Chronic back pain   . Depression    pt states when her husband passed away. now feels fine.  . Diverticulitis   . Melanoma ([place]) more than 10 yrs ago   Chest   . Pedal edema 02/20/2017  . Tobacco use 02/18/2017  . Urinary incontinence 02/18/2017    Past Surgical History:  Procedure Laterality Date  . ABDOMINAL HYSTERECTOMY  1989   Ovaries in place  . BACK SURGERY   20 yrs ago   lower  . COLOSTOMY TAKEDOWN N/A 10/22/2015   Procedure: LAPAROSCOPY WITH INTEROLYSIS, CLOSURE OF HARTMANS POUCH, REMOVAL OF LEFT OVARIAN CYST;  Surgeon: Johnathan Hausen, MD;  Location: [facility];  Service: General;  Laterality: N/A;  . FLEXIBLE SIGMOIDOSCOPY N/A 06/05/2015   Procedure: FLEXIBLE SIGMOIDOSCOPY;  Surgeon: Manus Gunning, MD;  Location: [facility];  Service: Gastroenterology;  Laterality: N/A;  . MELANOMA EXCISION    . PARTIAL COLECTOMY N/A 07/15/2015   Procedure: LAPAROSCOPIC ASSISTED SIGMOID COLECTOMY WITH COLOSTOMY, HARTMANN PROCEDURE, WITH PLACEMENT OF WOUND VAC;  Surgeon: Johnathan Hausen, MD;  Location: [facility];  Service: General;  Laterality: N/A;  . TONSILLECTOMY  as child    Social History   Socioeconomic History  . Marital status: Widowed    Spouse name: Not on file  . Number of children: Not on file  . Years of education: Not on file  . Highest education level: Not on file  Occupational History  . 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 Financial resource strain: Not on file  .  Food insecurity:    Worry: Not on file    Inability: Not on file  . Transportation needs:    Medical: Not on file    Non-medical: Not on file  Tobacco Use  . Smoking status: Current Every Day Smoker    Packs/day: 1.00    Years: 30.00    Pack years: 30.00    Types: Cigarettes  . Smokeless tobacco: Never Used  Substance and Sexual Activity  . Alcohol use: Yes    Alcohol/week: 0.0 oz    Comment: 1 glass of bourbon per night  . Drug use: No  . Sexual activity: Never  Lifestyle  . Physical activity:    Days per week: Not on file  Minutes per session: Not on file  . Stress: Not on file  Relationships  . Social connections:    Talks on phone: Not on file    Gets together: Not on file    Attends religious service: Not on file    Active member of club or organization: Not on file    Attends meetings of clubs or organizations: Not on file    Relationship status: Not on file  . Intimate partner violence:    Fear of current or ex partner: Not on file    Emotionally abused: Not on file    Physically abused: Not on file    Forced sexual activity: Not on file  Other Topics Concern  . Not on file  Social History Narrative   Currently widowed x 3 years    Family Status  Relation Name Status  . Father  Deceased  . Mother  Alive  . MGM  Deceased  . [place] Deceased  . [place] Deceased  . Mat Uncle gene Deceased  . Mat Aunt  Deceased  . Sister 2 Alive  . Brother 1 Alive  . Son 1 Alive  . Neg Hx  (Not Specified)    Review of Systems Pt reports only bathing/showering 1 time per week.  Admits to trouble with solids/liquids/pills.  Had "2 or three" swallow tests a "few years ago" and were normal per patient.  Told to drink with straw and it helps some (no longer goes down "the wrong pipe.") A complete 10 system ROS was obtained and was negative apart from what is mentioned.   Objective:   VITALS:   Vitals:   12/08/17 0823  BP: 122/70  Pulse: (!) 120    SpO2: 98%  Weight: 149 lb (67.6 kg)  Height: 5\' 2"  (1.575 m)   Gen:  Appears stated age and in NAD.  She is tearful. HEENT:  Normocephalic, atraumatic. The mucous membranes are moist. The superficial temporal arteries are without ropiness or tenderness. Cardiovascular: Tachycardic.  Regular. Lungs: Clear to auscultation bilaterally. Neck: There are no carotid bruits noted bilaterally.  NEUROLOGICAL:  Orientation:  The patient is alert and oriented x 3.  Recent and remote memory are intact.  Attention span and concentration are normal.  Able to name objects and repeat without trouble.  Fund of knowledge is appropriate Cranial nerves: There is good facial symmetry. The pupils are equal round and reactive to light bilaterally. Fundoscopic exam reveals clear disc margins bilaterally. Extraocular muscles are intact and visual fields are full to confrontational testing. Speech is fluent and clear. Soft palate rises symmetrically and there is no tongue deviation. Hearing is intact to conversational tone. Tone: Tone is good throughout. Sensation: Sensation is intact to light touch and pinprick throughout (facial, trunk, extremities). Vibration is intact at the bilateral big toe. There is no extinction with double simultaneous stimulation. There is no sensory dermatomal level identified. Coordination:  The patient has no dysdiadichokinesia or dysmetria. Motor: There is diffuse giveaway weakness that is improved with encouragement.  Strength is at least 4+-5-/5 in the upper and lower extremities.  There were no asymmetries.  Grip strength is decreased bilaterally, that also improved somewhat with encouragement. DTR's: Deep tendon reflexes are 2-/4 at the bilateral biceps, triceps, brachioradialis, patella and 1/4 at the bilateral achilles.  Plantar responses are downgoing bilaterally. Gait and Station: The patient easily arises out of the chair.  Initially, the right leg drug somewhat behind and never  goes in  front of the body, but stops at midline.  As we walked further, the gait was more physiologic with less dragging of the right leg.  MOVEMENT EXAM: Tremor:  There is no postural tremor.  There was very little intention tremor.  One could see mild tremulous activity with Archimedes spirals, but they are drawn very tightly together.  There is no rest tremor.  She is given a very full glass of water and asked to report from one glass to another.  She really does not spill the water, but her hands will intermittently shake in a nonrhythmic manner, bilaterally.  Labs:  Lab Results  Component Value Date   TSH 1.79 11/25/2017     Chemistry      Component Value Date/Time   NA 140 11/25/2017 0735   K 4.2 11/25/2017 0735   CL 104 11/25/2017 0735   CO2 25 11/25/2017 0735   BUN 14 11/25/2017 0735   CREATININE 0.70 11/25/2017 0735      Component Value Date/Time   CALCIUM 9.6 11/25/2017 0735   ALKPHOS 61 11/25/2017 0735   AST 21 11/25/2017 0735   ALT 23 11/25/2017 0735   BILITOT 0.3 11/25/2017 0735     Lab Results  Component Value Date   HGBA1C 5.5 10/17/2015   Lab Results  Component Value Date   WBC 7.6 11/25/2017   HGB 13.8 11/25/2017   HCT 41.0 11/25/2017   MCV 100.2 (H) 11/25/2017   PLT 326.0 11/25/2017        Assessment/Plan:   1.  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  I explained to her what this was.  I saw no leg tremor.  I do not recommend medication for this.  She was very frustrated and asked about her other symptoms, such as difficulty with concentration and word finding and falls.  I told her that she had no evidence of a neurodegenerative process like Parkinson's disease.  I also told her that I am a movement disorder specialist and generally do not evaluate patients for wider complaints.  She was offered neurocognitive testing for her difficulty with concentration, word finding and memory (suspect depression related), and she  declined that.  She did asked that we contact her referring provider (which was done) for a referral to a general neurologist.  2.  Depression  -Based less on what the patient said and more on her writings that she brought in, in combination with the fact that she was very tearful in the visit, I believe that many of her symptoms are from significant depression.  She is not attending to her personal hygiene, her dogs hygiene or basic household tasks.  She has been a widow for 3 years.  She never attended counseling at the death of her husband from cancer.  She is adamant that she is not depressed.  I did talk to her referring provider about this.  He is going to see her back in follow-up  3.  She does not need to follow-up here and in fact would like to see a general neurologist.  As above, I talked with her referring provider and he would like to see her back prior to doing that.  I would let the patient know that, but their office will call her.  CC:  Saguier, Percell Miller, PA-C

## 2017-12-08 ENCOUNTER — Telehealth: Payer: Self-pay | Admitting: Medical

## 2017-12-08 ENCOUNTER — Encounter: Payer: Self-pay | Admitting: Neurology

## 2017-12-08 ENCOUNTER — Ambulatory Visit: Payer: 59 | Admitting: Neurology

## 2017-12-08 VITALS — BP 122/70 | HR 120 | Ht 62.0 in | Wt 149.0 lb

## 2017-12-08 DIAGNOSIS — R413 Other amnesia: Secondary | ICD-10-CM

## 2017-12-08 DIAGNOSIS — R251 Tremor, unspecified: Secondary | ICD-10-CM | POA: Diagnosis not present

## 2017-12-08 DIAGNOSIS — F321 Major depressive disorder, single episode, moderate: Secondary | ICD-10-CM | POA: Diagnosis not present

## 2017-12-08 NOTE — Telephone Encounter (Signed)
I talked with neurologist today Dr. Carles Collet.  Patient was seen today and neurologist thinks that patient is probably depressed.  Would you call patient and let her know that I would like for her to come in for a visit either tomorrow or Monday.  I want her to have a 30-minute appointment.  You do not need to go into the fact that she is depressed.  Just let her know that I would like for her to be seen.

## 2017-12-09 NOTE — Telephone Encounter (Signed)
Call Patient is sech appointment for Monday 12/12/2017 @ 10AM.

## 2017-12-12 ENCOUNTER — Ambulatory Visit: Payer: 59 | Admitting: Medical

## 2017-12-12 ENCOUNTER — Encounter: Payer: Self-pay | Admitting: Medical

## 2017-12-12 VITALS — BP 140/80 | HR 60 | Temp 98.3°F | Resp 16 | Ht 62.0 in | Wt 151.8 lb

## 2017-12-12 DIAGNOSIS — R4189 Other symptoms and signs involving cognitive functions and awareness: Secondary | ICD-10-CM | POA: Diagnosis not present

## 2017-12-12 DIAGNOSIS — R29898 Other symptoms and signs involving the musculoskeletal system: Secondary | ICD-10-CM

## 2017-12-12 DIAGNOSIS — F329 Major depressive disorder, single episode, unspecified: Secondary | ICD-10-CM

## 2017-12-12 DIAGNOSIS — F32A Depression, unspecified: Secondary | ICD-10-CM

## 2017-12-12 DIAGNOSIS — F439 Reaction to severe stress, unspecified: Secondary | ICD-10-CM

## 2017-12-12 DIAGNOSIS — F419 Anxiety disorder, unspecified: Secondary | ICD-10-CM

## 2017-12-12 MED ORDER — SERTRALINE HCL 25 MG PO TABS: 25.0000 mg | ORAL_TABLET | Freq: Every day | ORAL | 0 refills | Status: DC

## 2017-12-12 NOTE — Patient Instructions (Addendum)
For chronic stress and anxiety related to work, I am prescribing sertraline 25 mg dose.  Rx advisement given in light of the fact that she is also on Cymbalta.  After discussion with you and review of your depression questionnaire, do have probable mild depressed mood.  With the above medication anxiety and stress you might see some improvement of your mood as well.  Please give [place] an update on whether your mood is improved or if any side effects develop.  For your history of random weak legs will go ahead and refer you to neurologist as you report you were told twice in the past that you might have MS.  Please sign release forms and call back later with both those neurologist name.  Having those records would be helpful.  Also will see if same neurologist could give opinion on your recent cognitive changes of poor concentration, difficulty finding words and feeling foggy headed at times.  Follow-up in 3-4 weeks or as needed.

## 2017-12-12 NOTE — Progress Notes (Signed)
Subjective:    Patient ID: [name], female    DOB: February 23, 1961, 57 y.o.   MRN: [id number]  HPI  Pt did see neurologist. Dr. Carles Collet thought pt was depressed. Pt does admit high level of stress. Direct of client services.  Expresses everything depends on her.  Pt drinks one small glass of Bourbon each night. Talks with neighbor each night about her day.  Pt denies any trouble falling asleep. No change in appetite. Reporting less energy to neurologist.. No random episodes of crying. Pt did admitted she cried the other day in the office. When neurologist told her she was depressed this was upsetting. Her husband had cancer and suffered for 2 years. However pt overall states she is not depressed. She states seeing him suffer was worse than his passing.    On review neurologist note and told her showering one time a week. Not walking her dog as much which she formerly liked to do a lot in past.   Neurologist thought that patient was depressed.  "Based less on what the patient said and more on her writings that she brought in, in combination with the fact that she was very tearful in the visit, I believe that many of her symptoms are from significant depression.  She is not attending to her personal hygiene, her dogs hygiene or basic household tasks.  She has been a widow for 3 years.  She never attended counseling at the death of her husband from cancer.  She is adamant that she is not depressed.  I did talk to her referring provider about this.  He is going to see her back in follow-up"  Pt did take her dog for a long walk yesterday.  Pt does feel like has difficulty finding words, poor concentration and feel foggy in head at times at times in past. Not having acute type symptoms.  Dr. Carles Collet thought her mild termor is physiologic tremor.  No parkinson.   In 2013 pt states 2 neurologist in past thought she might have MS. No random sharp eye pain. Pt does report sometimes legs feel week like  might fall.     Review of Systems  Constitutional: Negative for chills, diaphoresis and fever.  Respiratory: Negative for cough, chest tightness, shortness of breath and wheezing.   Cardiovascular: Negative for chest pain and palpitations.  Gastrointestinal: Negative for abdominal distention, abdominal pain, blood in stool, constipation, diarrhea, nausea and vomiting.  Genitourinary: Negative for decreased urine volume, difficulty urinating, dysuria, flank pain, frequency, menstrual problem and vaginal pain.  Musculoskeletal: Negative for back pain, myalgias and neck stiffness.  Skin: Negative for rash.  Neurological: Positive for weakness. Negative for dizziness, speech difficulty and headaches.       See HPI.  Weakness of leg episodes at times.  None recently.  Also some cognitive issues as explained on HPI.  Hematological: Negative for adenopathy. Does not bruise/bleed easily.  Psychiatric/Behavioral: Positive for dysphoric mood. Negative for behavioral problems, confusion, sleep disturbance and suicidal ideas. The patient is nervous/anxious.      Past Medical History:  Diagnosis Date  . Anemia   . Arthritis    oa left hip, sees dr Lyla Glassing  . Chronic back pain   . Depression    pt states when her husband passed away. now feels fine.  . Diverticulitis   . Melanoma ([place]) more than 10 yrs ago   Chest   . Pedal edema 02/20/2017  . Tobacco use 02/18/2017  . Urinary incontinence  02/18/2017     Social History   Socioeconomic History  . Marital status: Widowed    Spouse name: Not on file  . Number of children: Not on file  . Years of education: Not on file  . Highest education level: Not on file  Occupational History  . 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 Financial resource strain: Not on file  . Food insecurity:    Worry: Not on file    Inability: Not on file  . Transportation needs:    Medical: Not on file    Non-medical: Not  on file  Tobacco Use  . Smoking status: Current Every Day Smoker    Packs/day: 1.00    Years: 30.00    Pack years: 30.00    Types: Cigarettes  . Smokeless tobacco: Never Used  Substance and Sexual Activity  . Alcohol use: Yes    Alcohol/week: 0.0 oz    Comment: 1 glass of bourbon per night  . Drug use: No  . Sexual activity: Never  Lifestyle  . Physical activity:    Days per week: Not on file    Minutes per session: Not on file  . Stress: Not on file  Relationships  . Social connections:    Talks on phone: Not on file    Gets together: Not on file    Attends religious service: Not on file    Active member of club or organization: Not on file    Attends meetings of clubs or organizations: Not on file    Relationship status: Not on file  . Intimate partner violence:    Fear of current or ex partner: Not on file    Emotionally abused: Not on file    Physically abused: Not on file    Forced sexual activity: Not on file  Other Topics Concern  . Not on file  Social History Narrative   Currently widowed x 3 years    Past Surgical History:  Procedure Laterality Date  . ABDOMINAL HYSTERECTOMY  1989   Ovaries in place  . BACK SURGERY   20 yrs ago   lower  . COLOSTOMY TAKEDOWN N/A 10/22/2015   Procedure: LAPAROSCOPY WITH INTEROLYSIS, CLOSURE OF HARTMANS POUCH, REMOVAL OF LEFT OVARIAN CYST;  Surgeon: Johnathan Hausen, MD;  Location: [facility];  Service: General;  Laterality: N/A;  . FLEXIBLE SIGMOIDOSCOPY N/A 06/05/2015   Procedure: FLEXIBLE SIGMOIDOSCOPY;  Surgeon: Manus Gunning, MD;  Location: [facility];  Service: Gastroenterology;  Laterality: N/A;  . MELANOMA EXCISION    . PARTIAL COLECTOMY N/A 07/15/2015   Procedure: LAPAROSCOPIC ASSISTED SIGMOID COLECTOMY WITH COLOSTOMY, HARTMANN PROCEDURE, WITH PLACEMENT OF WOUND VAC;  Surgeon: Johnathan Hausen, MD;  Location: [facility];  Service: General;  Laterality: N/A;  . TONSILLECTOMY  as child    Family History  Problem  Relation Age of Onset  . Hypertension Father   . Cancer Maternal Grandmother 40       breast  . Cancer Maternal Aunt        breast  . Colon cancer Maternal Aunt 63  . Cancer Maternal Uncle        leukemia  . Colon cancer Maternal Uncle 39  . Cancer Maternal Uncle        colon  . Colon cancer Maternal Uncle 23  . Colon cancer Maternal Aunt 42  . Heart disease Neg Hx   . Stroke Neg Hx     Allergies  Allergen  Reactions  . Ampicillin Nausea And Vomiting    Has patient had a PCN reaction causing immediate rash, facial/tongue/throat swelling, SOB or lightheadedness with hypotension: No Has patient had a PCN reaction causing severe rash involving mucus membranes or skin necrosis: No Has patient had a PCN reaction that required hospitalization No Has patient had a PCN reaction occurring within the last 10 years: Yes If all of the above answers are "NO", then may proceed with Cephalosporin use.    Current Outpatient Medications on File Prior to Visit  Medication Sig Dispense Refill  . amLODipine (NORVASC) 5 MG tablet Take 1 tablet (5 mg total) by mouth daily. 90 tablet 0  . atorvastatin (LIPITOR) 10 MG tablet Take 1 tablet (10 mg total) by mouth daily. 90 tablet 0  . DULoxetine (CYMBALTA) 60 MG capsule TAKE 1 CAPSULE BY MOUTH DAILY 90 capsule 0  . nitrofurantoin, macrocrystal-monohydrate, (MACROBID) 100 MG capsule Take 1 capsule (100 mg total) by mouth 2 (two) times daily. 14 capsule 0  . traMADol (ULTRAM) 50 MG tablet Take 1 tablet (50 mg total) by mouth every 6 (six) hours as needed. 12 tablet 0   No current facility-administered medications on file prior to visit.     BP (!) 157/80   Pulse (!) 114   Temp 98.3 F (36.8 C) (Oral)   Resp 16   Ht 5\' 2"  (1.575 m)   Wt 151 lb 12.8 oz (68.9 kg)   SpO2 98%   BMI 27.76 kg/m       Objective:   Physical Exam  General Mental Status- Alert. General Appearance- Not in acute distress.   Skin General: Color- Normal Color.  Moisture- Normal Moisture.  Neck Carotid Arteries- Normal color. Moisture- Normal Moisture. No carotid bruits. No JVD.  Chest and Lung Exam Auscultation: Breath Sounds:-Normal.  Cardiovascular Auscultation:Rythm- Regular. Murmurs & Other Heart Sounds:Auscultation of the heart reveals- No Murmurs.   Neurologic Cranial Nerve exam:- CN III-XII intact(No nystagmus), symmetric smile. Strength:- 5/5 equal and symmetric strength both upper and lower extremities.      Assessment & Plan:  For chronic stress and anxiety related to work, I am prescribing sertraline 25 mg dose.  Rx advisement given in light of the fact that she is also on Cymbalta.  After discussion with you and review of your depression questionnaire, do have probable mild depressed mood.  With the above medication anxiety and stress you might see some improvement of your mood as well.  Please give [place] an update on whether your mood is improved or if any side effects develop.  For your history of random weak legs will go ahead and refer you to neurologist as you report you were told twice in the past that she might have MS.  Please sign release forms and call back later with both those neurologist name.  Having those records would be helpful.  Also will see if same neurologist could give opinion on your recent cognitive changes of poor concentration, difficulty finding words and feeling foggy headed at times.  Follow-up in 3-4 weeks or as needed.  Total of 40 minutes was spent with patient.  50% of time was spent counseling on possible depression, anxiety and stress.  In addition discussed possible cognitive issues as well as her history of MS.  4 Mini-Mental status exam today as well.  Mackie Pai, PA-C

## 2017-12-16 ENCOUNTER — Telehealth: Payer: Self-pay | Admitting: *Deleted

## 2017-12-16 NOTE — Telephone Encounter (Signed)
Received Medical records from [facility]; forwarded to provider/SLS 03/29

## 2018-01-08 ENCOUNTER — Other Ambulatory Visit: Payer: Self-pay | Admitting: Medical

## 2018-01-09 ENCOUNTER — Telehealth: Payer: Self-pay | Admitting: Medical

## 2018-01-09 ENCOUNTER — Ambulatory Visit: Payer: 59 | Admitting: Medical

## 2018-01-09 NOTE — Telephone Encounter (Signed)
Pt being seen on Wednesday. She needs a 30 minute appointment.

## 2018-01-10 NOTE — Telephone Encounter (Signed)
I have called patient and rescheduled her for 30 minute appt tomorrow.

## 2018-01-11 ENCOUNTER — Ambulatory Visit (INDEPENDENT_AMBULATORY_CARE_PROVIDER_SITE_OTHER): Payer: 59 | Admitting: Medical

## 2018-01-11 ENCOUNTER — Ambulatory Visit: Payer: 59 | Admitting: Medical

## 2018-01-11 ENCOUNTER — Telehealth: Payer: Self-pay | Admitting: Medical

## 2018-01-11 ENCOUNTER — Encounter: Payer: Self-pay | Admitting: Medical

## 2018-01-11 VITALS — BP 135/75 | HR 101 | Resp 16 | Ht 62.0 in | Wt 146.0 lb

## 2018-01-11 DIAGNOSIS — R4189 Other symptoms and signs involving cognitive functions and awareness: Secondary | ICD-10-CM

## 2018-01-11 DIAGNOSIS — R29898 Other symptoms and signs involving the musculoskeletal system: Secondary | ICD-10-CM | POA: Diagnosis not present

## 2018-01-11 DIAGNOSIS — F17200 Nicotine dependence, unspecified, uncomplicated: Secondary | ICD-10-CM | POA: Diagnosis not present

## 2018-01-11 DIAGNOSIS — F419 Anxiety disorder, unspecified: Secondary | ICD-10-CM

## 2018-01-11 MED ORDER — BUPROPION HCL ER (XL) 150 MG PO TB24: 150.0000 mg | ORAL_TABLET | Freq: Every day | ORAL | 1 refills | Status: DC

## 2018-01-11 NOTE — Patient Instructions (Signed)
For your history of lower extremity weakness, urinary incontinence, cognitive changes and a tremor, I did place new referral to neurologist as you had concerns for possible MS.  I will ask staff to check on the referral and update me.  When I get update we will notify you.  If symptoms worsen or change also let me know as we can notify neurologist and they might expedite your appointment.  For history of anxiety and stress, continue low-dose sertraline.  For history of smoking and desire to stop we will add Wellbutrin.  After discussion decided to discontinue Cymbalta for your body aches.  Regarding your upcoming travels around the world, I would recommend that you contact [facility] department/travel clinic and get their advice on which vaccines they would recommend since your travel so extensive.  Follow-up in 3 weeks or as needed.

## 2018-01-11 NOTE — Telephone Encounter (Signed)
Put in new referral to neurologist one month ago. This is not the Dr. Carles Collet referral. But new referral to evaluate her concern for MS. She has not been called can you check on status of that referral?

## 2018-01-11 NOTE — Progress Notes (Signed)
Subjective:    Patient ID: [name], female    DOB: 30-Oct-1960, 57 y.o.   MRN: [id number]  HPI  Pt states she still has shaking in her legs. Has gotten worse. At times feels like may fall but has not. Feels weaker than on last visit.   Pt states some urinary incontinence past couple of years. No episodes of eye pain.  Pt has some tremor in hands. Dr. Carles Collet thought physiologic tremor. Not parkinson.  I placed referral to neurologist twice. One to Dr. Carles Collet who thought no parkinson.  Placed referral to evaluate if MS since pt told me one neurologist in past thought this may be case.  Pt has not got call on that referral.  Dr. Carles Collet thought patient was depressed. After last visit after discussion about stress and anxiety. I rx'd sertraline. Pt states she did not notice difference with sertraline. She also wants to quite smoking. So she wants to consider wellbutrin in place of sertraline.   Pt also has question about vaccines she might need for 30 day trip cruise through [place] in December and trip around the world.   Review of Systems  Constitutional: Negative for chills, fatigue and fever.  Eyes: Negative for photophobia, pain, redness and itching.  Cardiovascular: Negative for chest pain and palpitations.  Gastrointestinal: Negative for abdominal pain and anal bleeding.  Genitourinary: Negative for pelvic pain and urgency.       Some urinary incontinence over past 2 years or more.  Musculoskeletal: Negative for back pain and gait problem.  Skin: Negative for rash.  Neurological: Negative for dizziness and headaches.       Feeling weak in legs at time.  Hematological: Negative for adenopathy. Does not bruise/bleed easily.  Psychiatric/Behavioral: Negative for behavioral problems, confusion and sleep disturbance. The patient is nervous/anxious.     Past Medical History:  Diagnosis Date  . Anemia   . Arthritis    oa left hip, sees dr Lyla Glassing  . Chronic back pain   .  Depression    pt states when her husband passed away. now feels fine.  . Diverticulitis   . Melanoma ([place]) more than 10 yrs ago   Chest   . Pedal edema 02/20/2017  . Tobacco use 02/18/2017  . Urinary incontinence 02/18/2017     Social History   Socioeconomic History  . Marital status: Widowed    Spouse name: Not on file  . Number of children: Not on file  . Years of education: Not on file  . Highest education level: Not on file  Occupational History  . 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 Financial resource strain: Not on file  . Food insecurity:    Worry: Not on file    Inability: Not on file  . Transportation needs:    Medical: Not on file    Non-medical: Not on file  Tobacco Use  . Smoking status: Current Every Day Smoker    Packs/day: 1.00    Years: 30.00    Pack years: 30.00    Types: Cigarettes  . Smokeless tobacco: Never Used  Substance and Sexual Activity  . Alcohol use: Yes    Alcohol/week: 0.0 oz    Comment: 1 glass of bourbon per night  . Drug use: No  . Sexual activity: Never  Lifestyle  . Physical activity:    Days per week: Not on file    Minutes per session: Not on file  .  Stress: Not on file  Relationships  . Social connections:    Talks on phone: Not on file    Gets together: Not on file    Attends religious service: Not on file    Active member of club or organization: Not on file    Attends meetings of clubs or organizations: Not on file    Relationship status: Not on file  . Intimate partner violence:    Fear of current or ex partner: Not on file    Emotionally abused: Not on file    Physically abused: Not on file    Forced sexual activity: Not on file  Other Topics Concern  . Not on file  Social History Narrative   Currently widowed x 3 years    Past Surgical History:  Procedure Laterality Date  . ABDOMINAL HYSTERECTOMY  1989   Ovaries in place  . BACK SURGERY   20 yrs ago   lower  .  COLOSTOMY TAKEDOWN N/A 10/22/2015   Procedure: LAPAROSCOPY WITH INTEROLYSIS, CLOSURE OF HARTMANS POUCH, REMOVAL OF LEFT OVARIAN CYST;  Surgeon: Johnathan Hausen, MD;  Location: [facility];  Service: General;  Laterality: N/A;  . FLEXIBLE SIGMOIDOSCOPY N/A 06/05/2015   Procedure: FLEXIBLE SIGMOIDOSCOPY;  Surgeon: Manus Gunning, MD;  Location: [facility];  Service: Gastroenterology;  Laterality: N/A;  . MELANOMA EXCISION    . PARTIAL COLECTOMY N/A 07/15/2015   Procedure: LAPAROSCOPIC ASSISTED SIGMOID COLECTOMY WITH COLOSTOMY, HARTMANN PROCEDURE, WITH PLACEMENT OF WOUND VAC;  Surgeon: Johnathan Hausen, MD;  Location: [facility];  Service: General;  Laterality: N/A;  . TONSILLECTOMY  as child    Family History  Problem Relation Age of Onset  . Hypertension Father   . Cancer Maternal Grandmother 40       breast  . Cancer Maternal Aunt        breast  . Colon cancer Maternal Aunt 15  . Cancer Maternal Uncle        leukemia  . Colon cancer Maternal Uncle 20  . Cancer Maternal Uncle        colon  . Colon cancer Maternal Uncle 46  . Colon cancer Maternal Aunt 33  . Heart disease Neg Hx   . Stroke Neg Hx     Allergies  Allergen Reactions  . Ampicillin Nausea And Vomiting    Has patient had a PCN reaction causing immediate rash, facial/tongue/throat swelling, SOB or lightheadedness with hypotension: No Has patient had a PCN reaction causing severe rash involving mucus membranes or skin necrosis: No Has patient had a PCN reaction that required hospitalization No Has patient had a PCN reaction occurring within the last 10 years: Yes If all of the above answers are "NO", then may proceed with Cephalosporin use.    Current Outpatient Medications on File Prior to Visit  Medication Sig Dispense Refill  . amLODipine (NORVASC) 5 MG tablet Take 1 tablet (5 mg total) by mouth daily. 90 tablet 0  . atorvastatin (LIPITOR) 10 MG tablet Take 1 tablet (10 mg total) by mouth daily. 90 tablet 0  .  DULoxetine (CYMBALTA) 60 MG capsule TAKE 1 CAPSULE BY MOUTH DAILY 90 capsule 0  . sertraline (ZOLOFT) 25 MG tablet TAKE 1 TABLET(25 MG) BY MOUTH DAILY 30 tablet 2  . traMADol (ULTRAM) 50 MG tablet Take 1 tablet (50 mg total) by mouth every 6 (six) hours as needed. (Patient not taking: Reported on 01/11/2018) 12 tablet 0   No current facility-administered medications on file prior to visit.  BP (!) 142/78 (BP Location: Left Arm, Patient Position: Sitting, Cuff Size: Normal)   Pulse (!) 101   Resp 16   Ht 5\' 2"  (1.575 m)   Wt 146 lb (66.2 kg)   SpO2 99%   BMI 26.70 kg/m       Objective:   Physical Exam   General Mental Status- Alert. General Appearance- Not in acute distress.   Skin General: Color- Normal Color. Moisture- Normal Moisture.  Neck Carotid Arteries- Normal color. Moisture- Normal Moisture. No carotid bruits. No JVD.  Chest and Lung Exam Auscultation: Breath Sounds:-Normal.  Cardiovascular Auscultation:Rythm- Regular. Murmurs & Other Heart Sounds:Auscultation of the heart reveals- No Murmurs.  Abdomen Inspection:-Inspeection Normal. Palpation/Percussion:Note:No mass. Palpation and Percussion of the abdomen reveal- Non Tender, Non Distended + BS, no rebound or guarding.    Neurologic Cranial Nerve exam:- CN III-XII intact(No nystagmus), symmetric smile. Drift Test:- No drift. Romberg Exam:- Negative.  Heal to Toe Gait exam:-Normal. Finger to Nose:- Normal/Intact Strength:- 5/5 equal and symmetric strength both upper and lower extremities.      Assessment & Plan:  For your history of lower extremity weakness, urinary incontinence, cognitive changes and a tremor, I did place new referral to neurologist as you had concerns for possible MS.  I will ask staff to check on the referral and update me.  When I get update we will notify you.  If symptoms worsen or change also let me know as we can notify neurologist and they might expedite your  appointment.  For history of anxiety and stress, continue low-dose sertraline.  For history of smoking and desire to stop we will add Wellbutrin.  After discussion decided to discontinue Cymbalta for your body aches.  Regarding your upcoming travels around the world, I would recommend that you contact [facility] department/travel clinic and get their advice on which vaccines they would recommend since your travel so extensive.  Follow-up in 3 weeks or as needed.  Mackie Pai, PA-C

## 2018-01-25 ENCOUNTER — Other Ambulatory Visit: Payer: Self-pay | Admitting: Medical

## 2018-01-25 NOTE — Telephone Encounter (Signed)
Pt requesting refill on Cymbalta no longer on med list. Please advise.

## 2018-01-27 NOTE — Telephone Encounter (Signed)
On day discontinued. Pt states was not helping her and she decided would not take so I discontinued. So not sure why refill sent. Automatic request from pharmacy? Or pt asked for?

## 2018-02-03 ENCOUNTER — Ambulatory Visit (HOSPITAL_BASED_OUTPATIENT_CLINIC_OR_DEPARTMENT_OTHER)
Admission: RE | Admit: 2018-02-03 | Discharge: 2018-02-03 | Disposition: A | Discharge status: Home / Self Care | Payer: 59 | Source: Ambulatory Visit | Attending: Medical | Admitting: Medical

## 2018-02-03 ENCOUNTER — Ambulatory Visit: Payer: 59 | Admitting: Medical

## 2018-02-03 ENCOUNTER — Encounter: Payer: Self-pay | Admitting: Medical

## 2018-02-03 ENCOUNTER — Telehealth: Payer: Self-pay | Admitting: Medical

## 2018-02-03 VITALS — BP 145/80 | HR 84 | Temp 97.7°F | Resp 16 | Ht 62.0 in | Wt 145.8 lb

## 2018-02-03 DIAGNOSIS — R251 Tremor, unspecified: Secondary | ICD-10-CM | POA: Diagnosis not present

## 2018-02-03 DIAGNOSIS — R600 Localized edema: Secondary | ICD-10-CM | POA: Diagnosis not present

## 2018-02-03 DIAGNOSIS — R252 Cramp and spasm: Secondary | ICD-10-CM | POA: Diagnosis not present

## 2018-02-03 DIAGNOSIS — R29898 Other symptoms and signs involving the musculoskeletal system: Secondary | ICD-10-CM | POA: Diagnosis not present

## 2018-02-03 LAB — MAGNESIUM: Magnesium: 2 mg/dL (ref 1.5–2.5)

## 2018-02-03 LAB — COMPREHENSIVE METABOLIC PANEL
ALK PHOS: 81 U/L (ref 39–117)
ALT: 16 U/L (ref 0–35)
AST: 20 U/L (ref 0–37)
Albumin: 4.1 g/dL (ref 3.5–5.2)
BUN: 12 mg/dL (ref 6–23)
CHLORIDE: 103 meq/L (ref 96–112)
CO2: 32 mEq/L (ref 19–32)
Calcium: 9.4 mg/dL (ref 8.4–10.5)
Creatinine, Ser: 0.73 mg/dL (ref 0.40–1.20)
GFR: 86.74 mL/min (ref >60.00)
Glucose, Bld: 85 mg/dL (ref 70–99)
Potassium: 4.2 mEq/L (ref 3.5–5.1)
Sodium: 142 mEq/L (ref 135–145)
Total Bilirubin: 0.3 mg/dL (ref 0.2–1.2)
Total Protein: 6.6 g/dL (ref 6.0–8.3)

## 2018-02-03 LAB — BRAIN NATRIURETIC PEPTIDE: Pro B Natriuretic peptide (BNP): 11 pg/mL (ref 0.0–100.0)

## 2018-02-03 MED ORDER — LOSARTAN POTASSIUM 50 MG PO TABS: 50.0000 mg | ORAL_TABLET | Freq: Every day | ORAL | 3 refills | Status: DC

## 2018-02-03 NOTE — Patient Instructions (Addendum)
For your history of recent pedal edema, I do think this might be dependent edema versus possible effects from amlodipine.  But still want to get a chest x-ray and BMP to rule out other potential causes.  If blood work and chest x-ray is normal then will likely stop amlodipine and put you on losartan.  For recent feet cramping will get metabolic panel and magnesium level.  Recommend that you do not take the electrolyte drops.  We need to look at the panel before making any decisions on supplementation.   For your worsening weakness of the lower extremity and your concern for MS, I have already put in referral to specialist.  I have asked referral staff to call over and investigate that.  She is working on that presently.  We will give you an update.  We might need to refer you to another office.  Follow up in 3-4 weeks or as needed

## 2018-02-03 NOTE — Telephone Encounter (Signed)
Sent in a prescription of losartan.  Patient will stop amlodipine.  See result note.

## 2018-02-03 NOTE — Progress Notes (Signed)
Subjective:    Patient ID: [name], female    DOB: 1961-04-24, 57 y.o.   MRN: [id number]  HPI  Pt in for follow up.  Pt states her shaking and trembling of upper extremity is less know. Her legs are feeling weaker now. When she stands and walks has bilateral proximal thigh pain and weakness. States walking 100 ft before her legs start to give out.  Pt not reporting any calf or thigh cramping.  But she does occasionally report some cramping to feet. Pt took some otc Lyte show. She states this helped. Last feet cramp she had was 3 weeks.   Both feet are swelling up to distal 1/3 tibia. No orthopnea. No sob with activity. No weight gain from one month ago.  No popliteal pain. Calfs are symmetric.  Pt swelling present for a couple of weeks.  Note pt on norvasc for couple of months.   Review of Systems  Genitourinary: Negative for difficulty urinating, dysuria, flank pain and urgency.       Some episodes of random incontinence without urge to urinate. No extreme back. But mild sciata type pain intermittent. Had this for year.  Musculoskeletal: Negative for back pain and gait problem.  Neurological: Positive for weakness. Negative for facial asymmetry, speech difficulty and numbness.       Intermittent/rare dizziness. Not know.  Hematological: Negative for adenopathy. Does not bruise/bleed easily.   Past Medical History:  Diagnosis Date  . Anemia   . Arthritis    oa left hip, sees dr Lyla Glassing  . Chronic back pain   . Depression    pt states when her husband passed away. now feels fine.  . Diverticulitis   . Melanoma ([place]) more than 10 yrs ago   Chest   . Pedal edema 02/20/2017  . Tobacco use 02/18/2017  . Urinary incontinence 02/18/2017     Social History   Socioeconomic History  . Marital status: Widowed    Spouse name: Not on file  . Number of children: Not on file  . Years of education: Not on file  . Highest education level: Not on file  Occupational History  .  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 Financial resource strain: Not on file  . Food insecurity:    Worry: Not on file    Inability: Not on file  . Transportation needs:    Medical: Not on file    Non-medical: Not on file  Tobacco Use  . Smoking status: Current Every Day Smoker    Packs/day: 1.00    Years: 30.00    Pack years: 30.00    Types: Cigarettes  . Smokeless tobacco: Never Used  Substance and Sexual Activity  . Alcohol use: Yes    Alcohol/week: 0.0 oz    Comment: 1 glass of bourbon per night  . Drug use: No  . Sexual activity: Never  Lifestyle  . Physical activity:    Days per week: Not on file    Minutes per session: Not on file  . Stress: Not on file  Relationships  . Social connections:    Talks on phone: Not on file    Gets together: Not on file    Attends religious service: Not on file    Active member of club or organization: Not on file    Attends meetings of clubs or organizations: Not on file    Relationship status: Not on file  . Intimate partner  violence:    Fear of current or ex partner: Not on file    Emotionally abused: Not on file    Physically abused: Not on file    Forced sexual activity: Not on file  Other Topics Concern  . Not on file  Social History Narrative   Currently widowed x 3 years    Past Surgical History:  Procedure Laterality Date  . ABDOMINAL HYSTERECTOMY  1989   Ovaries in place  . BACK SURGERY   20 yrs ago   lower  . COLOSTOMY TAKEDOWN N/A 10/22/2015   Procedure: LAPAROSCOPY WITH INTEROLYSIS, CLOSURE OF HARTMANS POUCH, REMOVAL OF LEFT OVARIAN CYST;  Surgeon: Johnathan Hausen, MD;  Location: [facility];  Service: General;  Laterality: N/A;  . FLEXIBLE SIGMOIDOSCOPY N/A 06/05/2015   Procedure: FLEXIBLE SIGMOIDOSCOPY;  Surgeon: Manus Gunning, MD;  Location: [facility];  Service: Gastroenterology;  Laterality: N/A;  . MELANOMA EXCISION    . PARTIAL COLECTOMY N/A 07/15/2015    Procedure: LAPAROSCOPIC ASSISTED SIGMOID COLECTOMY WITH COLOSTOMY, HARTMANN PROCEDURE, WITH PLACEMENT OF WOUND VAC;  Surgeon: Johnathan Hausen, MD;  Location: [facility];  Service: General;  Laterality: N/A;  . TONSILLECTOMY  as child    Family History  Problem Relation Age of Onset  . Hypertension Father   . Cancer Maternal Grandmother 40       breast  . Cancer Maternal Aunt        breast  . Colon cancer Maternal Aunt 80  . Cancer Maternal Uncle        leukemia  . Colon cancer Maternal Uncle 64  . Cancer Maternal Uncle        colon  . Colon cancer Maternal Uncle 85  . Colon cancer Maternal Aunt 31  . Heart disease Neg Hx   . Stroke Neg Hx     Allergies  Allergen Reactions  . Ampicillin Nausea And Vomiting    Has patient had a PCN reaction causing immediate rash, facial/tongue/throat swelling, SOB or lightheadedness with hypotension: No Has patient had a PCN reaction causing severe rash involving mucus membranes or skin necrosis: No Has patient had a PCN reaction that required hospitalization No Has patient had a PCN reaction occurring within the last 10 years: Yes If all of the above answers are "NO", then may proceed with Cephalosporin use.    Current Outpatient Medications on File Prior to Visit  Medication Sig Dispense Refill  . amLODipine (NORVASC) 5 MG tablet Take 1 tablet (5 mg total) by mouth daily. 90 tablet 0  . atorvastatin (LIPITOR) 10 MG tablet Take 1 tablet (10 mg total) by mouth daily. 90 tablet 0  . buPROPion (WELLBUTRIN XL) 150 MG 24 hr tablet Take 1 tablet (150 mg total) by mouth daily. 30 tablet 1  . sertraline (ZOLOFT) 25 MG tablet TAKE 1 TABLET(25 MG) BY MOUTH DAILY 30 tablet 2  . traMADol (ULTRAM) 50 MG tablet Take 1 tablet (50 mg total) by mouth every 6 (six) hours as needed. 12 tablet 0   No current facility-administered medications on file prior to visit.     BP (!) 145/80   Pulse 84   Temp 97.7 F (36.5 C) (Oral)   Resp 16   Ht 5\' 2"  (1.575 m)    Wt 145 lb 12.8 oz (66.1 kg)   SpO2 98%   BMI 26.67 kg/m       Objective:   Physical Exam  General Mental Status- Alert. General Appearance- Not in acute distress.  Skin General: Color- Normal Color. Moisture- Normal Moisture.  Neck Carotid Arteries- Normal color. Moisture- Normal Moisture. No carotid bruits. No JVD.  Chest and Lung Exam Auscultation: Breath Sounds:-Normal.  Cardiovascular Auscultation:Rythm- Regular. Murmurs & Other Heart Sounds:Auscultation of the heart reveals- No Murmurs.  Abdomen Inspection:-Inspeection Normal. Palpation/Percussion:Note:No mass. Palpation and Percussion of the abdomen reveal- Non Tender, Non Distended + BS, no rebound or guarding.    Neurologic Cranial Nerve exam:- CN III-XII intact(No nystagmus), symmetric smile. Drift Test:- No drift. Finger to Nose:- Normal/Intact Strength:- 3/5 equal and symmetric strength both upper and lower extremities. Mild bilateral trembling of hands   Lower ext- 1 + edema. Negative homans sign. Calfs symmetric.   Neurologic Cranial Nerve exam:- CN III-XII intact(No nystagmus), symmetric smile. Drift Test:- No drift. Finger to Nose:- Normal/Intact Strength:- 3/5 equal and symmetric strength both upper and lower extremities.      Assessment & Plan:  For your history of recent pedal edema, I do think this might be dependent edema versus possible effects from amlodipine.  But still want to get a chest x-ray and BMP to rule out other potential causes.  If blood work and chest x-ray is normal then will likely stop amlodipine and put you on losartan.  For recent feet cramping will get metabolic panel and magnesium level.  Recommend that you do not take the electrolyte drops.  We need to look at the panel before making any decisions on supplementation.   For your worsening weakness of the lower extremity and your concern for MS, I have already put in referral to specialist.  I have asked referral  staff to call over and investigate that.  She is working on that presently.  We will give you an update.  We might need to refer you to another office.    Note some of patient's lower extremity weakness symptoms might be related to spinal stenosis.   But still want neurologist evaluation to check her for possible MS??  Follow up in 3-4 weeks or as needed  [employer], PA-C

## 2018-02-17 ENCOUNTER — Encounter: Payer: Self-pay | Admitting: Medical

## 2018-02-17 ENCOUNTER — Ambulatory Visit: Payer: 59 | Admitting: Medical

## 2018-02-17 VITALS — BP 148/88 | HR 77 | Temp 98.1°F | Resp 16 | Ht 62.0 in | Wt 149.6 lb

## 2018-02-17 DIAGNOSIS — R600 Localized edema: Secondary | ICD-10-CM

## 2018-02-17 DIAGNOSIS — I10 Essential (primary) hypertension: Secondary | ICD-10-CM

## 2018-02-17 MED ORDER — HYDROCHLOROTHIAZIDE 12.5 MG PO CAPS: 12.5000 mg | ORAL_CAPSULE | Freq: Every day | ORAL | 3 refills | Status: DC

## 2018-02-17 NOTE — Patient Instructions (Signed)
For htn still not adequate controlled adding hctz 12.5 mg to your regimen. Continue losartan. This may help you pedal edema as well. Elevate legs. If edema persists consider ted hose compression stockings.  Follow up 7-10 days for nurse bp check.

## 2018-02-17 NOTE — Progress Notes (Signed)
Subjective:    Patient ID: [name], female    DOB: 18-Feb-1961, 57 y.o.   MRN: [id number]  HPI  Pt in for follow up. She still has swelling in her distal pretibial area and feet. Work up for chf was negative. I stopped amlodipine and switched her to losartan.    Pt has not checked her bp since I last saw her. No cardiac or gross motor/sensory function deficits.    Review of Systems  Constitutional: Negative for chills, fatigue and fever.  Respiratory: Negative for chest tightness, shortness of breath and wheezing.   Cardiovascular: Negative for chest pain and palpitations.  Gastrointestinal: Negative for abdominal distention and anal bleeding.  Musculoskeletal: Negative for back pain.  Skin: Negative for rash.  Neurological: Negative for dizziness, light-headedness and headaches.  Hematological: Negative for adenopathy. Does not bruise/bleed easily.  Psychiatric/Behavioral: Negative for behavioral problems, confusion, sleep disturbance and suicidal ideas.    Past Medical History:  Diagnosis Date  . Anemia   . Arthritis    oa left hip, sees dr Lyla Glassing  . Chronic back pain   . Depression    pt states when her husband passed away. now feels fine.  . Diverticulitis   . Melanoma ([place]) more than 10 yrs ago   Chest   . Pedal edema 02/20/2017  . Tobacco use 02/18/2017  . Urinary incontinence 02/18/2017     Social History   Socioeconomic History  . Marital status: Widowed    Spouse name: Not on file  . Number of children: Not on file  . Years of education: Not on file  . Highest education level: Not on file  Occupational History  . 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 Financial resource strain: Not on file  . Food insecurity:    Worry: Not on file    Inability: Not on file  . Transportation needs:    Medical: Not on file    Non-medical: Not on file  Tobacco Use  . Smoking status: Current Every Day Smoker    Packs/day:  1.00    Years: 30.00    Pack years: 30.00    Types: Cigarettes  . Smokeless tobacco: Never Used  Substance and Sexual Activity  . Alcohol use: Yes    Alcohol/week: 0.0 oz    Comment: 1 glass of bourbon per night  . Drug use: No  . Sexual activity: Never  Lifestyle  . Physical activity:    Days per week: Not on file    Minutes per session: Not on file  . Stress: Not on file  Relationships  . Social connections:    Talks on phone: Not on file    Gets together: Not on file    Attends religious service: Not on file    Active member of club or organization: Not on file    Attends meetings of clubs or organizations: Not on file    Relationship status: Not on file  . Intimate partner violence:    Fear of current or ex partner: Not on file    Emotionally abused: Not on file    Physically abused: Not on file    Forced sexual activity: Not on file  Other Topics Concern  . Not on file  Social History Narrative   Currently widowed x 3 years    Past Surgical History:  Procedure Laterality Date  . ABDOMINAL HYSTERECTOMY  1989   Ovaries in place  . BACK SURGERY  20 yrs ago   lower  . COLOSTOMY TAKEDOWN N/A 10/22/2015   Procedure: LAPAROSCOPY WITH INTEROLYSIS, CLOSURE OF HARTMANS POUCH, REMOVAL OF LEFT OVARIAN CYST;  Surgeon: Johnathan Hausen, MD;  Location: [facility];  Service: General;  Laterality: N/A;  . FLEXIBLE SIGMOIDOSCOPY N/A 06/05/2015   Procedure: FLEXIBLE SIGMOIDOSCOPY;  Surgeon: Manus Gunning, MD;  Location: [facility];  Service: Gastroenterology;  Laterality: N/A;  . MELANOMA EXCISION    . PARTIAL COLECTOMY N/A 07/15/2015   Procedure: LAPAROSCOPIC ASSISTED SIGMOID COLECTOMY WITH COLOSTOMY, HARTMANN PROCEDURE, WITH PLACEMENT OF WOUND VAC;  Surgeon: Johnathan Hausen, MD;  Location: [facility];  Service: General;  Laterality: N/A;  . TONSILLECTOMY  as child    Family History  Problem Relation Age of Onset  . Hypertension Father   . Cancer Maternal Grandmother 40        breast  . Cancer Maternal Aunt        breast  . Colon cancer Maternal Aunt 48  . Cancer Maternal Uncle        leukemia  . Colon cancer Maternal Uncle 69  . Cancer Maternal Uncle        colon  . Colon cancer Maternal Uncle 36  . Colon cancer Maternal Aunt 17  . Heart disease Neg Hx   . Stroke Neg Hx     Allergies  Allergen Reactions  . Ampicillin Nausea And Vomiting    Has patient had a PCN reaction causing immediate rash, facial/tongue/throat swelling, SOB or lightheadedness with hypotension: No Has patient had a PCN reaction causing severe rash involving mucus membranes or skin necrosis: No Has patient had a PCN reaction that required hospitalization No Has patient had a PCN reaction occurring within the last 10 years: Yes If all of the above answers are "NO", then may proceed with Cephalosporin use.    Current Outpatient Medications on File Prior to Visit  Medication Sig Dispense Refill  . atorvastatin (LIPITOR) 10 MG tablet Take 1 tablet (10 mg total) by mouth daily. 90 tablet 0  . buPROPion (WELLBUTRIN XL) 150 MG 24 hr tablet Take 1 tablet (150 mg total) by mouth daily. 30 tablet 1  . losartan (COZAAR) 50 MG tablet Take 1 tablet (50 mg total) by mouth daily. 30 tablet 3  . sertraline (ZOLOFT) 25 MG tablet TAKE 1 TABLET(25 MG) BY MOUTH DAILY 30 tablet 2  . traMADol (ULTRAM) 50 MG tablet Take 1 tablet (50 mg total) by mouth every 6 (six) hours as needed. 12 tablet 0   No current facility-administered medications on file prior to visit.     BP (!) 166/81   Pulse 77   Temp 98.1 F (36.7 C) (Oral)   Resp 16   Ht 5\' 2"  (1.575 m)   Wt 149 lb 9.6 oz (67.9 kg)   SpO2 99%   BMI 27.36 kg/m       Objective:   Physical Exam  General Mental Status- Alert. General Appearance- Not in acute distress.   Skin General: Color- Normal Color. Moisture- Normal Moisture.  Neck Carotid Arteries- Normal color. Moisture- Normal Moisture. No carotid bruits. No JVD.  Chest and Lung  Exam Auscultation: Breath Sounds:-Normal.  Cardiovascular Auscultation:Rythm- Regular. Murmurs & Other Heart Sounds:Auscultation of the heart reveals- No Murmurs.  Abdomen Inspection:-Inspeection Normal. Palpation/Percussion:Note:No mass. Palpation and Percussion of the abdomen reveal- Non Tender, Non Distended + BS, no rebound or guarding.    Neurologic Cranial Nerve exam:- CN III-XII intact(No nystagmus), symmetric smile.   Lower ext-  1+ pedal edema. Distal pretibial area and on top of feet. Negative homans sign bilaterally. Neither side red, warm or indurate. Left foot ankle junction faint tender on papation.      Assessment & Plan:  For htn still not adequate controlled adding hctz 12.5 mg to your regimen. Continue losartan. This may help you pedal edema as well. Elevate legs. If edema persists consider ted hose compression stockings.  Follow up 7-10 days for nurse bp check.   Mackie Pai, PA-C

## 2018-02-21 ENCOUNTER — Other Ambulatory Visit: Payer: Self-pay | Admitting: Medical

## 2018-02-23 ENCOUNTER — Encounter: Payer: Self-pay | Admitting: Medical

## 2018-02-26 ENCOUNTER — Telehealth: Payer: Self-pay | Admitting: Medical

## 2018-02-26 DIAGNOSIS — I10 Essential (primary) hypertension: Secondary | ICD-10-CM

## 2018-02-26 NOTE — Telephone Encounter (Signed)
Future cmp placed. 

## 2018-03-01 ENCOUNTER — Ambulatory Visit: Payer: 59

## 2018-03-05 ENCOUNTER — Other Ambulatory Visit: Payer: Self-pay | Admitting: Medical

## 2018-03-09 ENCOUNTER — Ambulatory Visit (INDEPENDENT_AMBULATORY_CARE_PROVIDER_SITE_OTHER): Payer: 59 | Admitting: Medical

## 2018-03-09 ENCOUNTER — Other Ambulatory Visit (INDEPENDENT_AMBULATORY_CARE_PROVIDER_SITE_OTHER): Payer: 59

## 2018-03-09 VITALS — BP 114/78 | HR 93

## 2018-03-09 DIAGNOSIS — I10 Essential (primary) hypertension: Secondary | ICD-10-CM

## 2018-03-09 LAB — COMPREHENSIVE METABOLIC PANEL
ALK PHOS: 87 U/L (ref 39–117)
ALT: 16 U/L (ref 0–35)
AST: 23 U/L (ref 0–37)
Albumin: 4.2 g/dL (ref 3.5–5.2)
BILIRUBIN TOTAL: 0.4 mg/dL (ref 0.2–1.2)
BUN: 15 mg/dL (ref 6–23)
CO2: 30 mEq/L (ref 19–32)
Calcium: 9.5 mg/dL (ref 8.4–10.5)
Chloride: 99 mEq/L (ref 96–112)
Creatinine, Ser: 0.92 mg/dL (ref 0.40–1.20)
GFR: 66.4 mL/min (ref >60.00)
GLUCOSE: 84 mg/dL (ref 70–99)
Potassium: 4.2 mEq/L (ref 3.5–5.1)
SODIUM: 138 meq/L (ref 135–145)
TOTAL PROTEIN: 6.4 g/dL (ref 6.0–8.3)

## 2018-03-09 NOTE — Progress Notes (Addendum)
Pre visit review using our clinic tool,if applicable. No additional management support is needed unless otherwise documented below in the visit note.   Pt here for Blood pressure check per order from [facility]  Pt currently takes: Losartan 50 mg daily and Hcts 12.05 mg daily.  No complaints voiced this visit. Patient states she has not taken BP medications this am.  BP last visit = 148/88 P= 77  Pt reports compliance with medication.  BP today @ = 114/78 HR = 93  Pt advised per [employer],   to take BP  readings daily for 1 week and BP medication as ordered. Call office with BP readings. Patient agreed.  Bp readings are usually higher. Will verify this low bp is persisting trend in one week. If so might have her get labs check cbc and cmp. If both normal and bp still lower side might cut back losartan to 25 mg.  Mackie Pai, PA-C

## 2018-03-27 ENCOUNTER — Encounter: Payer: Self-pay | Admitting: Medical

## 2018-03-28 ENCOUNTER — Ambulatory Visit: Payer: 59 | Admitting: Family

## 2018-03-29 ENCOUNTER — Encounter: Payer: Self-pay | Admitting: Medical

## 2018-03-29 ENCOUNTER — Ambulatory Visit: Payer: 59 | Admitting: Medical

## 2018-03-29 VITALS — BP 128/70 | HR 103 | Temp 98.5°F | Resp 16 | Ht 62.0 in | Wt 147.4 lb

## 2018-03-29 DIAGNOSIS — R600 Localized edema: Secondary | ICD-10-CM | POA: Diagnosis not present

## 2018-03-29 NOTE — Progress Notes (Signed)
Subjective:    Patient ID: [name], female    DOB: [birth year], 59 y.o.   MRN: [id number]  HPI   Pt in with bilateral lower ext swelling from about distal calf to her feet down towards her toes. Pt weight has been stable. Pt does report some mild pain/but  Repot of any obvious  severe dyspnea  on walking. Pt is elevating feet at night. Feet/legs not as swollen in morning as at night.   Pt is on diuretic. 02-17-2018 heat failure work up for edema was negative. When uses ted hose stocking sometimes hurts.  Rt side is more painful than left side. No bllister type eruption to her legs.  No fever, no chills or sweats.   Pt update me she had seen neurologist and had emg studies, mri of head and will see neurosurgeon. Known spinal stenosis and bulging disc.    Review of Systems  Constitutional: Negative for chills, fatigue and fever.  Respiratory: Negative for apnea, cough, choking, shortness of breath and wheezing.   Cardiovascular: Negative for chest pain and palpitations.  Musculoskeletal:       See hpi  Skin:       See hpi.  Neurological: Negative for dizziness, speech difficulty, weakness and headaches.  Hematological: Negative for adenopathy. Does not bruise/bleed easily.    Past Medical History:  Diagnosis Date  . Anemia   . Arthritis    oa left hip, sees dr Lyla Glassing  . Chronic back pain   . Depression    pt states when her husband passed away. now feels fine.  . Diverticulitis   . Melanoma ([place]) more than 10 yrs ago   Chest   . Pedal edema 02/20/2017  . Tobacco use 02/18/2017  . Urinary incontinence 02/18/2017     Social History   Socioeconomic History  . Marital status: Widowed    Spouse name: Not on file  . Number of children: Not on file  . Years of education: Not on file  . Highest education level: Not on file  Occupational History  . 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 Financial resource strain: Not on  file  . Food insecurity:    Worry: Not on file    Inability: Not on file  . Transportation needs:    Medical: Not on file    Non-medical: Not on file  Tobacco Use  . Smoking status: Current Every Day Smoker    Packs/day: 1.00    Years: 30.00    Pack years: 30.00    Types: Cigarettes  . Smokeless tobacco: Never Used  Substance and Sexual Activity  . Alcohol use: Yes    Alcohol/week: 0.0 oz    Comment: 1 glass of bourbon per night  . Drug use: No  . Sexual activity: Never  Lifestyle  . Physical activity:    Days per week: Not on file    Minutes per session: Not on file  . Stress: Not on file  Relationships  . Social connections:    Talks on phone: Not on file    Gets together: Not on file    Attends religious service: Not on file    Active member of club or organization: Not on file    Attends meetings of clubs or organizations: Not on file    Relationship status: Not on file  . Intimate partner violence:    Fear of current or ex partner: Not on file  Emotionally abused: Not on file    Physically abused: Not on file    Forced sexual activity: Not on file  Other Topics Concern  . Not on file  Social History Narrative   Currently widowed x 3 years    Past Surgical History:  Procedure Laterality Date  . ABDOMINAL HYSTERECTOMY  1989   Ovaries in place  . BACK SURGERY   20 yrs ago   lower  . COLOSTOMY TAKEDOWN N/A 10/22/2015   Procedure: LAPAROSCOPY WITH INTEROLYSIS, CLOSURE OF HARTMANS POUCH, REMOVAL OF LEFT OVARIAN CYST;  Surgeon: Johnathan Hausen, MD;  Location: [facility];  Service: General;  Laterality: N/A;  . FLEXIBLE SIGMOIDOSCOPY N/A 06/05/2015   Procedure: FLEXIBLE SIGMOIDOSCOPY;  Surgeon: Manus Gunning, MD;  Location: [facility];  Service: Gastroenterology;  Laterality: N/A;  . MELANOMA EXCISION    . PARTIAL COLECTOMY N/A 07/15/2015   Procedure: LAPAROSCOPIC ASSISTED SIGMOID COLECTOMY WITH COLOSTOMY, HARTMANN PROCEDURE, WITH PLACEMENT OF WOUND VAC;   Surgeon: Johnathan Hausen, MD;  Location: [facility];  Service: General;  Laterality: N/A;  . TONSILLECTOMY  as child    Family History  Problem Relation Age of Onset  . Hypertension Father   . Cancer Maternal Grandmother 40       breast  . Cancer Maternal Aunt        breast  . Colon cancer Maternal Aunt 53  . Cancer Maternal Uncle        leukemia  . Colon cancer Maternal Uncle 28  . Cancer Maternal Uncle        colon  . Colon cancer Maternal Uncle 53  . Colon cancer Maternal Aunt 14  . Heart disease Neg Hx   . Stroke Neg Hx     Allergies  Allergen Reactions  . Ampicillin Nausea And Vomiting    Has patient had a PCN reaction causing immediate rash, facial/tongue/throat swelling, SOB or lightheadedness with hypotension: No Has patient had a PCN reaction causing severe rash involving mucus membranes or skin necrosis: No Has patient had a PCN reaction that required hospitalization No Has patient had a PCN reaction occurring within the last 10 years: Yes If all of the above answers are "NO", then may proceed with Cephalosporin use.    Current Outpatient Medications on File Prior to Visit  Medication Sig Dispense Refill  . atorvastatin (LIPITOR) 10 MG tablet TAKE 1 TABLET(10 MG) BY MOUTH DAILY 90 tablet 0  . buPROPion (WELLBUTRIN XL) 150 MG 24 hr tablet TAKE 1 TABLET(150 MG) BY MOUTH DAILY 30 tablet 0  . hydrochlorothiazide (MICROZIDE) 12.5 MG capsule Take 1 capsule (12.5 mg total) by mouth daily. 30 capsule 3  . losartan (COZAAR) 50 MG tablet Take 1 tablet (50 mg total) by mouth daily. 30 tablet 3  . sertraline (ZOLOFT) 25 MG tablet TAKE 1 TABLET(25 MG) BY MOUTH DAILY 30 tablet 2  . traMADol (ULTRAM) 50 MG tablet Take 1 tablet (50 mg total) by mouth every 6 (six) hours as needed. 12 tablet 0   No current facility-administered medications on file prior to visit.     BP 128/70   Pulse (!) 103   Temp 98.5 F (36.9 C) (Oral)   Resp 16   Ht 5\' 2"  (1.575 m)   Wt 147 lb 6.4 oz (66.9  kg)   SpO2 99%   BMI 26.96 kg/m       Objective:   Physical Exam   General Mental Status- Alert. General Appearance- Not in acute distress.  Skin General: Color- Normal Color. Moisture- Normal Moisture.  Neck Carotid Arteries- Normal color. Moisture- Normal Moisture. No carotid bruits. No JVD.  Chest and Lung Exam Auscultation: Breath Sounds:-Normal.  Cardiovascular Auscultation:Rythm- Regular. Murmurs & Other Heart Sounds:Auscultation of the heart reveals- No Murmurs.  Abdomen Inspection:-Inspeection Normal. Palpation/Percussion:Note:No mass. Palpation and Percussion of the abdomen reveal- Non Tender, Non Distended + BS, no rebound or guarding.  Neurologic Cranial Nerve exam:- CN III-XII intact(No nystagmus), symmetric smile. Strength:- 5/5 equal and symmetric strength both upper and lower extremities.  Lower ext- faint 1+ pedal edema at best. Calfs symmetric. No induration. No fluctuance. No warmth.     Assessment & Plan:  You  did have bilateral mild pedal edema.  Worse recently.  As we discussed today I do not think this represents a CHF as your work-up for pedal edema at the end of May did not indicate CHF.  So I do think this is likely dependent type edema.  Your blood pressure is at very good range today.  You could on occasion double up on your HCTZ dose.  This would put you at 25 mg for 1 or 2 days intermittently.  If you do that on occasion then would recommend that you decrease losartan by half.  I think this would keep your blood pressure at relatively good level such as it is today.  Also we discussed other differential diagnosis of pedal edema such as DVT but by exam this does not appear to be the case.  If you do get swelling of your legs with pain in the popliteal region/behind the knees let me know and we would do ultrasounds.  Also discussed skin infection and differential diagnosis but exam does not support this presently.  If you do have worsening or  changing symptoms please let me know.  On days that you double up on HCTZ recommend that you eat a banana.  That way it will keep your potassium up.  Your last metabolic panel look good.  Also note you have the option to use TED hose compression stockings as well.  Follow-up in 2 to 3 weeks or as needed.  Mackie Pai, PA-C

## 2018-03-29 NOTE — Patient Instructions (Addendum)
You  did have bilateral mild pedal edema.  Worse recently.  As we discussed today I do not think this represents a CHF as your work-up for pedal edema at the end of May did not indicate CHF.  So I do think this is likely dependent type edema.  Your blood pressure is at very good range today.  You could on occasion double up on your HCTZ dose.  This would put you at 25 mg for 1 or 2 days intermittently.  If you do that on occasion then would recommend that you decrease losartan by half.  I think this would keep your blood pressure at relatively good level such as it is today.  Also we discussed other differential diagnosis of pedal edema such as DVT but by exam this does not appear to be the case.  If you do get swelling of your legs with pain in the popliteal region/behind the knees let me know and we would do ultrasounds.  Also discussed skin infection and differential diagnosis but exam does not support this presently.  If you do have worsening or changing symptoms please let me know.  On days that you double up on HCTZ recommend that you eat a banana.  That way it will keep your potassium up.  Your last metabolic panel look good.  Also note you have the option to use TED hose compression stockings as well.  Follow-up in 2 to 3 weeks or as needed.

## 2018-04-02 ENCOUNTER — Other Ambulatory Visit: Payer: Self-pay | Admitting: Medical

## 2018-04-12 ENCOUNTER — Encounter: Payer: Self-pay | Admitting: Medical

## 2018-04-16 ENCOUNTER — Encounter: Payer: Self-pay | Admitting: Medical

## 2018-04-21 ENCOUNTER — Telehealth: Payer: Self-pay

## 2018-04-27 NOTE — Telephone Encounter (Signed)
Error

## 2018-04-28 ENCOUNTER — Other Ambulatory Visit: Payer: Self-pay | Admitting: Medical

## 2018-06-20 ENCOUNTER — Ambulatory Visit: Payer: Self-pay

## 2018-06-20 NOTE — Telephone Encounter (Signed)
Needs appointment

## 2018-06-20 NOTE — Telephone Encounter (Signed)
Forwarding to NT for patient to be Triaged.  ----- Message -----  From: Claudina Lick  Sent: 06/19/2018  7:11 PM EDT  To: Madelyn Brunner Pool  Subject: Appointment Request                 Appointment Request From: Claudina Lick    With Provider: Mackie Pai, PA-C [place] [facility]]    Preferred Date Range: 06/20/2018 - 06/23/2018    Preferred Times: Monday Morning, Tuesday Morning, Wednesday Morning, Thursday Morning, Friday Morning    Reason for visit: Request an Appointment    Comments:  I have not been taking the hp or chors meds but my feet and lower legs are still very swollen. I am having to wear larger shoes and my feet hurt at the end of the day.

## 2018-06-20 NOTE — Telephone Encounter (Signed)
Have made 3 attempts to contact pt. To discuss her symptoms, without success.  Will forward note to PCP office at this time.

## 2018-06-21 NOTE — Telephone Encounter (Signed)
Please call and schedule appointment.

## 2018-06-21 NOTE — Telephone Encounter (Signed)
Pt is already scheduled for 06/22/18 @ 9:20

## 2018-06-22 ENCOUNTER — Ambulatory Visit (INDEPENDENT_AMBULATORY_CARE_PROVIDER_SITE_OTHER): Payer: 59 | Admitting: Medical

## 2018-06-22 ENCOUNTER — Ambulatory Visit (HOSPITAL_BASED_OUTPATIENT_CLINIC_OR_DEPARTMENT_OTHER)
Admission: RE | Admit: 2018-06-22 | Discharge: 2018-06-22 | Disposition: A | Discharge status: Home / Self Care | Payer: 59 | Source: Ambulatory Visit | Attending: Medical | Admitting: Medical

## 2018-06-22 ENCOUNTER — Encounter: Payer: Self-pay | Admitting: Medical

## 2018-06-22 VITALS — BP 150/80 | HR 82 | Temp 98.1°F | Resp 16 | Ht 62.0 in | Wt 147.8 lb

## 2018-06-22 DIAGNOSIS — R600 Localized edema: Secondary | ICD-10-CM | POA: Diagnosis present

## 2018-06-22 DIAGNOSIS — M79609 Pain in unspecified limb: Secondary | ICD-10-CM

## 2018-06-22 MED ORDER — CHLORTHALIDONE 25 MG PO TABS
25.0000 mg | ORAL_TABLET | Freq: Every day | ORAL | 0 refills | Status: DC
Start: 2018-06-22 — End: 2018-07-17

## 2018-06-22 NOTE — Progress Notes (Signed)
   Subjective:    Patient ID: [name], female    DOB: 1960/12/20, 57 y.o.   MRN: [id number]  HPI  Pt in for follow up on her pedal edema. Pt states she stopped all her medications. Amlodipine and diuretic.   Prior work up for lower ext swelling ruled out chf. No [place] have been done. Pt reports no sob.   No pain behind her knees.   Pt state ted hose stocking hurt. She elevates her legs at work. In the past when she woke up in the morning swelling was down. Now they are not decreasing.  Pt stopped all her meds thinking she had side effect/edema from med. But did not help. Pt states not depressed does not need zoloft. She still smoking and wellbutrin did not help. She was formerly on losartan, amlodipne and hctz.   Review of Systems  Constitutional: Negative for chills, fatigue and fever.  Respiratory: Negative for cough, chest tightness, shortness of breath and wheezing.   Cardiovascular: Negative for chest pain and palpitations.  Gastrointestinal: Negative for abdominal pain, blood in stool and diarrhea.  Musculoskeletal:       Pedal edema.  Skin: Negative for rash.  Hematological: Negative for adenopathy. Does not bruise/bleed easily.  Psychiatric/Behavioral: Negative for behavioral problems, decreased concentration and dysphoric mood.       Objective:   Physical Exam  General Mental Status- Alert. General Appearance- Not in acute distress.   Skin General: Color- Normal Color. Moisture- Normal Moisture.  Neck Carotid Arteries- Normal color. Moisture- Normal Moisture. No carotid bruits. No JVD.  Chest and Lung Exam Auscultation: Breath Sounds:-Normal.  Cardiovascular Auscultation:Rythm- Regular. Murmurs & Other Heart Sounds:Auscultation of the heart reveals- No Murmurs.   Neurologic Cranial Nerve exam:- CN III-XII intact(No nystagmus), symmetric smile. Strength:- 5/5 equal and symmetric strength both upper and lower extremities.  Lower ext- 2+ pedal edema  bilaterally. Rt side faint homan sign      Assessment & Plan:  For you pedal edema and htn, I am prescribing chlorthalidone 25 mg tab. Please take daily but check bp daily as well. Give [place] update on bp reading as I want to make sure bp not dropping too much. If so then may need lower dose diuretic(or may need to half the dose)  Will also order bilateral lower ext [place] to make sure no dvt present as swelling is more than usual.   For you pedal edema and htn, I am prescribing chlorthalidone 25 mg tab. Please take daily but check bp daily as well. Give [place] update on bp reading as I want to make sure bp not dropping too much. If so then may need lower dose diuretic(or may need to half the dose)  Will also order bilateral lower ext [place] to make sure no dvt present as swelling is more than usual.   Follow up in 2 weeks or as needed   [employer]

## 2018-06-22 NOTE — Patient Instructions (Addendum)
For you pedal edema and htn, I am prescribing chlorthalidone 25 mg tab. Please take daily but check bp daily as well. Give [place] update on bp reading as I want to make sure bp not dropping too much. If so then may need lower dose diuretic(or may need to half the dose)  Will also order bilateral lower ext [place] to make sure no dvt present as swelling is more than usual.   For you pedal edema and htn, I am prescribing chlorthalidone 25 mg tab. Please take daily but check bp daily as well. Give [place] update on bp reading as I want to make sure bp not dropping too much. If so then may need lower dose diuretic(or may need to half the dose)  Will also order bilateral lower ext [place] to make sure no dvt present as swelling is more than usual.   Follow up in 2 weeks or as needed

## 2018-06-23 NOTE — Telephone Encounter (Signed)
Pt. Seen in office by PCP on 10/3.

## 2018-07-07 ENCOUNTER — Telehealth: Payer: Self-pay | Admitting: *Deleted

## 2018-07-07 NOTE — Telephone Encounter (Signed)
Patient called to set up travel advice in preparation for world cruise, departure 09/03/18, will be traveling for 150+ days.  1st itinerary includes [place], [place], [place], [place].  2nd itinerary is [place], [place], [place], [place], [place], [place], [place], [place] ([place], [place]), [place], [place], [place], [place], [place], [place] ([place]), [place] ([place]), [place] (multiple stops), [place] ([place]), [place] ([place], [place]), [place], [place], [place], [place], [place], [place], [place], [place], and the [place].  She is aware she may need Yellow Fever for her itinerary, would like to discuss with physician. Scheduled 10/25 9:00 with Dr Linus Salmons. Landis Gandy, RN

## 2018-07-14 ENCOUNTER — Encounter: Payer: Self-pay | Admitting: Internal Medicine

## 2018-07-14 ENCOUNTER — Ambulatory Visit (INDEPENDENT_AMBULATORY_CARE_PROVIDER_SITE_OTHER): Payer: 59 | Admitting: Internal Medicine

## 2018-07-14 DIAGNOSIS — Z7184 Encounter for health counseling related to travel: Secondary | ICD-10-CM | POA: Diagnosis not present

## 2018-07-14 DIAGNOSIS — Z7189 Other specified counseling: Secondary | ICD-10-CM

## 2018-07-14 DIAGNOSIS — Z789 Other specified health status: Secondary | ICD-10-CM | POA: Diagnosis not present

## 2018-07-14 DIAGNOSIS — Z298 Encounter for other specified prophylactic measures: Secondary | ICD-10-CM | POA: Diagnosis not present

## 2018-07-14 DIAGNOSIS — Z9189 Other specified personal risk factors, not elsewhere classified: Secondary | ICD-10-CM | POA: Diagnosis not present

## 2018-07-14 DIAGNOSIS — Z7185 Encounter for immunization safety counseling: Secondary | ICD-10-CM

## 2018-07-14 DIAGNOSIS — Z23 Encounter for immunization: Secondary | ICD-10-CM

## 2018-07-14 MED ORDER — AZITHROMYCIN 500 MG PO TABS: 1000.0000 mg | ORAL_TABLET | Freq: Once | ORAL | 0 refills | Status: AC

## 2018-07-14 MED ORDER — ATOVAQUONE-PROGUANIL HCL 250-100 MG PO TABS: 1.0000 | ORAL_TABLET | Freq: Every day | ORAL | 0 refills | Status: AC

## 2018-07-14 MED ORDER — ARTEMETHER-LUMEFANTRINE 20-120 MG PO TABS: ORAL_TABLET | ORAL | 0 refills | Status: AC

## 2018-07-14 MED ORDER — TYPHOID VI POLYSACCHARIDE VACC 25 MCG/0.5ML IM SOLN
0.5000 mL | Freq: Once | INTRAMUSCULAR | Status: AC
  Administered 2018-07-14: 0.5 mL via INTRAMUSCULAR

## 2018-07-14 NOTE — Progress Notes (Signed)
Subjective:   [name] is a 57 y.o. female who presents to the Infectious Disease clinic for travel consultation. Planned departure date: September 03, 2018 Planned return date: 6 months Countries of travel: multiple countries Areas in country: some travel in the [place] otherwise cities    Accommodations: hotel and ship Purpose of travel: vacation Prior travel out of [place]: yes     Objective:   Medications: reviewed    Assessment:   No contraindications to travel. none     Plan:    Issues discussed: environmental concerns, freshwater swimming, future shots, insect-borne illnesses, Japanese encephalitis, malaria, MVA safety, rabies, safe food/water, traveler's diarrhea, website/handouts for more information, what to do if ill upon return, what to do if ill while there and Yellow Fever. Immunizations recommended: Hepatitis A series, Typhoid (parenteral) and Yellow Fever. Malaria prophylaxis: malarone, daily dose starting 1-2 days before entering endemic area, ending 7 days after leaving area Traveler's diarrhea prophylaxis: azithromycin. Total duration of visit: 1 Hour. Total time spent on education, counseling, coordination of care: 30 Minutes.

## 2018-07-14 NOTE — Patient Instructions (Signed)
[facility]                [address], [place]                   [place], [place] [address]                      Phone: [phone]                        Fax: [phone]   Planned departure date: September 03, 2018          Planned return date: 6 months Countries of travel: multiple destinations   Guidelines for the Prevention & Treatment of Traveler's Diarrhea  Prevention: "Boil it, Peel it, Lacinda Axon it, or Forget it"   the fewer chances -> lower risk: try to stick to food & water precautions as much as possible"   If it's "piping hot"; it is probably okay, if not, it may not be   Treatment   1) You should always take care to drink lots of fluids in order to avoid dehydration   2) You should bring medications with you in case you come down with a case of diarrhea   3) OTC = bring pepto-bismol - can take with initial abdominal symptoms;                    Imodium - can help slow down your intestinal tract, can help relief cramps                    and diarrhea, can take if no bloody diarrhea  Use azithromycin if needed for traveler's diarrhea  Guidelines for the Prevention of Malaria  Avoidance:  -fewer mosquito bites = lower risk. Mosquitos can bite at night as well as daytime  -cover up (long sleeve clothing), mosquito nets, screens  -Insect repellent for your skin ( DEET containing lotion > 20%): for clothes ( permethrin spray)   Prior to travel to the [employer], start malarone, daily dose starting 1-2 days before entering endemic area, ending 7 days after leaving area for malaria prevention.   Immunizations received today: Hepatitis A series and Typhoid (parenteral)  Future immunizations, if indicated none indicated   Prior to travel:  1) Be sure to pick up appropriate prescriptions, including medicine you take daily. Do not expect to be able to fill your prescriptions abroad.  2) Strongly consider obtaining traveler's  insurance, including emergency evacuation insurance. Most plans in the [place] do not cover participants abroad. (see below for resources)  3) Register at the appropriate [place] embassy or consulate with travel dates so they are aware of your presence in-country and for helpful advice during travel using the [employer] (STEP, GreenNylon.com.cy).  4) Leave contact information with a relative or friend.  5) Keep a Research officer, political party, credit cards in case they become lost or stolen  6) Inform your credit card company that you will be travelling abroad   During travel:  1) If you become ill and need medical advice, the [place] [employer]	[place] of the country you are traveling in general provides a list of [place] speaking doctors.  We are also available on MyChart for remote consultation if you register prior to travel. 2) Avoid motorcycles or scooters when at all possible. Traffic laws in many countries are lax and accidents  occur frequently.  3) Do not take any unnecessary risks that you wouldn't do at home.   Resources:  -Country specific information: BlindResource.ca or GreenNylon.com.cy  -Press photographer (DEET, mosquito nets): [employer], [employer] store, [employer], [place] insurance options: gatewayplans.com; http://clayton-rivera.info/; travelguard.com or [employer], gninsurance.com or info@gninsurance .com, [id number].   Post Travel:  If you return from your trip ill, call your primary care doctor or our travel clinic @ [phone].   Enjoy your trip and know that with proper pre-travel preparation, most people have an enjoyable and uninterrupted trip!

## 2018-07-17 ENCOUNTER — Other Ambulatory Visit: Payer: Self-pay | Admitting: Medical

## 2018-07-17 ENCOUNTER — Encounter: Payer: Self-pay | Admitting: Medical

## 2018-07-18 ENCOUNTER — Telehealth: Payer: Self-pay | Admitting: Medical

## 2018-07-18 MED ORDER — CHLORTHALIDONE 25 MG PO TABS: ORAL_TABLET | ORAL | 3 refills | Status: DC

## 2018-07-18 NOTE — Telephone Encounter (Signed)
Refill of chlorthalidone sent to patient's pharmacy.

## 2018-08-10 ENCOUNTER — Telehealth: Payer: Self-pay | Admitting: Medical

## 2018-08-10 ENCOUNTER — Encounter: Payer: Self-pay | Admitting: Medical

## 2018-08-10 DIAGNOSIS — I10 Essential (primary) hypertension: Secondary | ICD-10-CM

## 2018-08-10 MED ORDER — CHLORTHALIDONE 25 MG PO TABS: ORAL_TABLET | ORAL | 0 refills | Status: AC

## 2018-08-10 NOTE — Telephone Encounter (Signed)
rx extended chorthalidone since leaving country until summer. Will also recommend cmp/check k before she leaves since on diuretic

## 2018-08-14 ENCOUNTER — Other Ambulatory Visit (INDEPENDENT_AMBULATORY_CARE_PROVIDER_SITE_OTHER): Payer: 59

## 2018-08-14 DIAGNOSIS — I10 Essential (primary) hypertension: Secondary | ICD-10-CM | POA: Diagnosis not present

## 2018-08-14 LAB — COMPREHENSIVE METABOLIC PANEL
ALBUMIN: 4.6 g/dL (ref 3.5–5.2)
ALT: 15 U/L (ref 0–35)
AST: 14 U/L (ref 0–37)
Alkaline Phosphatase: 63 U/L (ref 39–117)
BUN: 12 mg/dL (ref 6–23)
CHLORIDE: 96 meq/L (ref 96–112)
CO2: 35 mEq/L — ABNORMAL HIGH (ref 19–32)
Calcium: 10.3 mg/dL (ref 8.4–10.5)
Creatinine, Ser: 0.83 mg/dL (ref 0.40–1.20)
GFR: 74.66 mL/min (ref >60.00)
Glucose, Bld: 112 mg/dL — ABNORMAL HIGH (ref 70–99)
POTASSIUM: 4 meq/L (ref 3.5–5.1)
SODIUM: 138 meq/L (ref 135–145)
Total Bilirubin: 0.4 mg/dL (ref 0.2–1.2)
Total Protein: 7 g/dL (ref 6.0–8.3)

## 2018-12-26 ENCOUNTER — Telehealth: Payer: Self-pay | Admitting: Medical

## 2018-12-26 NOTE — Telephone Encounter (Signed)
Will you call patient and see how she is doing. I remember in fall she was preparing to go on tirp for 6 months to various countries. See how she is and offer follow up appointment/ virtual visit. Is she back in [place]?  Follow up for bp check and see how her pedal edema is?

## 2018-12-27 NOTE — Telephone Encounter (Signed)
MyChart message sent. Virtual Visit?

## 2019-01-22 NOTE — Telephone Encounter (Signed)
Attempted to reach pt via phone to schedule a Virtual follow up and recording states voicemail is full. Unable to leave a message. Mailed letter to pt as [employer] has already been sent.

## 2020-08-19 ENCOUNTER — Ambulatory Visit: Payer: Self-pay | Attending: Internal Medicine

## 2020-08-19 ENCOUNTER — Other Ambulatory Visit (HOSPITAL_BASED_OUTPATIENT_CLINIC_OR_DEPARTMENT_OTHER): Payer: Self-pay | Admitting: Internal Medicine

## 2020-08-19 DIAGNOSIS — Z23 Encounter for immunization: Secondary | ICD-10-CM

## 2020-08-19 MED FILL — JANSSEN COVID-19 VACCINE 0.: 0.5 | 1 days supply | Qty: 1 | Fill #0

## 2020-08-19 NOTE — Progress Notes (Signed)
   Covid-19 Vaccination Clinic  Name:  [name]    MRN: [id number] DOB: 1961/02/03  08/19/2020  [name] was observed post Covid-19 immunization for 15 minutes without incident. She was provided with Vaccine Information Sheet and instruction to access the V-Safe system.   [name] was instructed to call 911 with any severe reactions post vaccine: Marland Kitchen Difficulty breathing  . Swelling of face and throat  . A fast heartbeat  . A bad rash all over body  . Dizziness and weakness   Immunizations Administered    Name Date Dose VIS Date Route   JANSSEN COVID-19 VACCINE 08/19/2020 11:02 AM 0.5 mL 07/09/2020 Intramuscular   Manufacturer: Alphonsa Overall   Lot: [id number]   [place]: [phone]
